# Patient Record
Sex: Female | Born: 1975
Health system: Southern US, Community
[De-identification: ages and names within clinical notes are randomized; demographics above are authoritative.]

## PROBLEM LIST (undated history)

## (undated) DIAGNOSIS — K829 Disease of gallbladder, unspecified: Secondary | ICD-10-CM

## (undated) DIAGNOSIS — Z800 Family history of malignant neoplasm of digestive organs: Secondary | ICD-10-CM

## (undated) DIAGNOSIS — E785 Hyperlipidemia, unspecified: Secondary | ICD-10-CM

## (undated) DIAGNOSIS — C4370 Malignant melanoma of unspecified lower limb, including hip: Secondary | ICD-10-CM

## (undated) DIAGNOSIS — F419 Anxiety disorder, unspecified: Secondary | ICD-10-CM

## (undated) DIAGNOSIS — T7840XA Allergy, unspecified, initial encounter: Secondary | ICD-10-CM

## (undated) DIAGNOSIS — G4730 Sleep apnea, unspecified: Secondary | ICD-10-CM

## (undated) DIAGNOSIS — IMO0002 Reserved for concepts with insufficient information to code with codable children: Secondary | ICD-10-CM

## (undated) DIAGNOSIS — Z87440 Personal history of urinary (tract) infections: Secondary | ICD-10-CM

## (undated) DIAGNOSIS — R87619 Unspecified abnormal cytological findings in specimens from cervix uteri: Secondary | ICD-10-CM

## (undated) DIAGNOSIS — K8020 Calculus of gallbladder without cholecystitis without obstruction: Secondary | ICD-10-CM

## (undated) DIAGNOSIS — D649 Anemia, unspecified: Secondary | ICD-10-CM

## (undated) DIAGNOSIS — K219 Gastro-esophageal reflux disease without esophagitis: Secondary | ICD-10-CM

## (undated) DIAGNOSIS — C44311 Basal cell carcinoma of skin of nose: Secondary | ICD-10-CM

## (undated) HISTORY — DX: Malignant melanoma of unspecified lower limb, including hip: C43.70

## (undated) HISTORY — DX: Basal cell carcinoma of skin of nose: C44.311

## (undated) HISTORY — PX: COLONOSCOPY: SHX174

## (undated) HISTORY — DX: Allergy, unspecified, initial encounter: T78.40XA

## (undated) HISTORY — DX: Hyperlipidemia, unspecified: E78.5

## (undated) HISTORY — DX: Anemia, unspecified: D64.9

## (undated) HISTORY — DX: Family history of malignant neoplasm of digestive organs: Z80.0

## (undated) HISTORY — DX: Anxiety disorder, unspecified: F41.9

## (undated) HISTORY — DX: Personal history of urinary (tract) infections: Z87.440

## (undated) HISTORY — DX: Gastro-esophageal reflux disease without esophagitis: K21.9

## (undated) HISTORY — DX: Sleep apnea, unspecified: G47.30

## (undated) HISTORY — PX: CHOLECYSTECTOMY: SHX55

## (undated) HISTORY — PX: CRYOTHERAPY: SHX1416

---

## 2002-03-12 ENCOUNTER — Other Ambulatory Visit: Admission: RE | Admit: 2002-03-12 | Discharge: 2002-03-12 | Payer: Self-pay | Admitting: *Deleted

## 2003-06-05 ENCOUNTER — Other Ambulatory Visit: Admission: RE | Admit: 2003-06-05 | Discharge: 2003-06-05 | Payer: Self-pay | Admitting: *Deleted

## 2004-08-29 ENCOUNTER — Ambulatory Visit: Payer: Self-pay | Admitting: Internal Medicine

## 2004-09-13 ENCOUNTER — Ambulatory Visit: Payer: Self-pay | Admitting: Internal Medicine

## 2004-09-20 ENCOUNTER — Ambulatory Visit: Payer: Self-pay | Admitting: Internal Medicine

## 2005-04-07 ENCOUNTER — Ambulatory Visit: Payer: Self-pay | Admitting: Internal Medicine

## 2006-03-01 ENCOUNTER — Ambulatory Visit: Payer: Self-pay | Admitting: Internal Medicine

## 2006-03-02 ENCOUNTER — Encounter: Admission: RE | Admit: 2006-03-02 | Discharge: 2006-03-02 | Payer: Self-pay | Admitting: Internal Medicine

## 2008-12-18 ENCOUNTER — Ambulatory Visit: Payer: Self-pay | Admitting: Family Medicine

## 2008-12-18 DIAGNOSIS — M533 Sacrococcygeal disorders, not elsewhere classified: Secondary | ICD-10-CM | POA: Insufficient documentation

## 2008-12-18 DIAGNOSIS — J45909 Unspecified asthma, uncomplicated: Secondary | ICD-10-CM | POA: Insufficient documentation

## 2009-01-04 ENCOUNTER — Ambulatory Visit: Payer: Self-pay | Admitting: Family Medicine

## 2009-01-04 DIAGNOSIS — F4320 Adjustment disorder, unspecified: Secondary | ICD-10-CM | POA: Insufficient documentation

## 2009-01-20 ENCOUNTER — Telehealth (INDEPENDENT_AMBULATORY_CARE_PROVIDER_SITE_OTHER): Payer: Self-pay | Admitting: *Deleted

## 2009-01-21 ENCOUNTER — Ambulatory Visit: Payer: Self-pay | Admitting: Family Medicine

## 2009-01-21 DIAGNOSIS — M25539 Pain in unspecified wrist: Secondary | ICD-10-CM | POA: Insufficient documentation

## 2009-03-12 ENCOUNTER — Ambulatory Visit: Payer: Self-pay | Admitting: Family Medicine

## 2009-03-15 LAB — CONVERTED CEMR LAB: Rh Type: NEGATIVE

## 2009-03-16 ENCOUNTER — Encounter (INDEPENDENT_AMBULATORY_CARE_PROVIDER_SITE_OTHER): Payer: Self-pay | Admitting: *Deleted

## 2009-03-16 LAB — CONVERTED CEMR LAB
ALT: 13 units/L (ref 0–35)
AST: 17 units/L (ref 0–37)
Alkaline Phosphatase: 60 units/L (ref 39–117)
Basophils Relative: 0.1 % (ref 0.0–3.0)
Calcium: 8.9 mg/dL (ref 8.4–10.5)
Glucose, Bld: 90 mg/dL (ref 70–99)
HDL: 53 mg/dL (ref >39.00)
LDL Cholesterol: 130 mg/dL — ABNORMAL HIGH (ref 0–99)
Lymphocytes Relative: 40.8 % (ref 12.0–46.0)
MCHC: 34.4 g/dL (ref 30.0–36.0)
MCV: 90.5 fL (ref 78.0–100.0)
Neutro Abs: 3 10*3/uL (ref 1.4–7.7)
Neutrophils Relative %: 46.5 % (ref 43.0–77.0)
Platelets: 232 10*3/uL (ref 150.0–400.0)
RBC: 4.41 M/uL (ref 3.87–5.11)
RDW: 12.8 % (ref 11.5–14.6)
Sodium: 139 meq/L (ref 135–145)
TSH: 2.18 microintl units/mL (ref 0.35–5.50)
Total CHOL/HDL Ratio: 4

## 2009-06-03 ENCOUNTER — Telehealth (INDEPENDENT_AMBULATORY_CARE_PROVIDER_SITE_OTHER): Payer: Self-pay | Admitting: *Deleted

## 2009-06-07 ENCOUNTER — Telehealth (INDEPENDENT_AMBULATORY_CARE_PROVIDER_SITE_OTHER): Payer: Self-pay | Admitting: *Deleted

## 2009-06-07 ENCOUNTER — Ambulatory Visit: Payer: Self-pay | Admitting: Family Medicine

## 2009-09-08 ENCOUNTER — Ambulatory Visit: Payer: Self-pay | Admitting: Family

## 2009-11-15 ENCOUNTER — Ambulatory Visit: Payer: Self-pay | Admitting: Family

## 2009-11-15 DIAGNOSIS — M545 Low back pain, unspecified: Secondary | ICD-10-CM | POA: Insufficient documentation

## 2009-12-19 ENCOUNTER — Inpatient Hospital Stay (HOSPITAL_COMMUNITY): Admission: RE | Admit: 2009-12-19 | Discharge: 2009-12-19 | Payer: Self-pay | Admitting: Obstetrics and Gynecology

## 2009-12-31 ENCOUNTER — Ambulatory Visit (HOSPITAL_COMMUNITY): Admission: RE | Admit: 2009-12-31 | Discharge: 2009-12-31 | Payer: Self-pay | Admitting: Obstetrics and Gynecology

## 2010-02-20 ENCOUNTER — Observation Stay (HOSPITAL_COMMUNITY): Admission: AD | Admit: 2010-02-20 | Discharge: 2010-02-20 | Payer: Self-pay | Admitting: Obstetrics & Gynecology

## 2010-03-15 ENCOUNTER — Inpatient Hospital Stay (HOSPITAL_COMMUNITY): Admission: AD | Admit: 2010-03-15 | Discharge: 2010-03-18 | Payer: Self-pay | Admitting: Obstetrics & Gynecology

## 2010-03-16 ENCOUNTER — Encounter (INDEPENDENT_AMBULATORY_CARE_PROVIDER_SITE_OTHER): Payer: Self-pay | Admitting: Obstetrics & Gynecology

## 2010-04-01 ENCOUNTER — Ambulatory Visit (HOSPITAL_COMMUNITY): Admission: RE | Admit: 2010-04-01 | Discharge: 2010-04-01 | Payer: Self-pay | Admitting: Surgery

## 2010-09-23 ENCOUNTER — Ambulatory Visit: Payer: Self-pay | Admitting: Family Medicine

## 2010-09-23 DIAGNOSIS — M654 Radial styloid tenosynovitis [de Quervain]: Secondary | ICD-10-CM | POA: Insufficient documentation

## 2010-10-09 NOTE — L&D Delivery Note (Signed)
Delivery Note At approximately 0100 a viable female was delivered via NSVD at home.  Patient presented to L&D via EMS.  Placenta was delivered; spontaneous and intact.  3-vessel cord with no complications.  Fundus contracted down well.  IM pitocin was available, declined by patient.  Anesthesia:  none Episiotomy: n/a Lacerations: 2nd degree perineal Suture Repair: 3.0 vicryl Est. Blood Loss (mL):  Mom to postpartum.  Baby to nursery-stable.  [name], Alexandra Tolan 07/08/2011, 1:31 AM

## 2010-11-08 NOTE — Assessment & Plan Note (Signed)
Summary: tailbone issue/lower back//lh   Vital Signs:  Patient profile:   35 year old female Weight:      136.6 pounds Pulse rate:   64 / minute BP sitting:   110 / 60  (right arm)  Vitals Entered By: Doristine Devoid (November 15, 2009 8:13 AM) CC: tailbone flare up now going up to lower back    CC:  tailbone flare up now going up to lower back .  History of Present Illness: Alexandra [name] is a 35 year old pregnant female who presents today with complain of worsening low back pain.  She has a history of tailbone pain which has now progressed to low back pain.  Pain is best in AM, worst as day moves on.  Has been doing prenatal yoga and recently also bought a new mattress.  These measures have not helped.  She has used tylenol on occasion with some relief.  Allergies: 1)  ! Septra  Physical Exam  General:  Well-developed,well-nourished,in no acute distress; alert,appropriate and cooperative throughout examination Abdomen:  gravid Msk:  no tenderness overlaying spine Neurologic:  strength normal in bilateral lower extremities.  Gait normal.   alert & oriented X3.  patellar refexes intact bilaterally   Impression & Recommendations:  Problem # 1:  LOW BACK PAIN SYNDROME (ICD-724.2) Assessment Deteriorated I believe that this has been made worse by patient's pregnancy.  I recommended continued stretching/prenatal yoga.  as needed tylenol and that she try a pregnancy body pillow for sleep.  Unfortunately we are limited in what we can do for her while she is pregnant.  We may consider physical therapy if ok with her OB and symptoms do not improve with the above measures.  Complete Medication List: 1)  Butt Donut  .... Use as needed  Patient Instructions: 1)  Continue to use your donut pillow as needed for tail bone pain.   2)  Take 650mg  of Tylenol every 4-6 hours as needed for relief of severe back pain.   3)  You may continue your prenatal yoga class, but don't do any activities that  worsen your pain. 4)  Consider investing in a pregnancy body pillow (Snoogle) This may also help your pain.   5)  Please call if symptoms worsen or do not improve.

## 2010-11-10 NOTE — Assessment & Plan Note (Signed)
Summary: both hands numb and tingley;also affecting thumb --carpel tun...   Vital Signs:  Patient profile:   35 year old female Weight:      122 pounds BMI:     19.76 Pulse rate:   108 / minute BP sitting:   118 / 70  (left arm)  Vitals Entered By: Doristine Devoid CMA (September 23, 2010 1:15 PM) CC: R wrist painful worst w/ moving thumb    History of Present Illness: 35 yo woman here today for R wrist pain.  has been wearing wrist braces for the carpal tunnel sxs but a few weeks ago developed severe pain in R wrist and thumb.  on Wed could barely move hand.  Current Medications (verified): 1)  None  Allergies (verified): 1)  ! 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  reviewed supportive care and red flags that should prompt return.  Pt expresses understanding and is in agreement w/ this plan.  Complete Medication List: 1)  Ibuprofen 800 Mg Tabs (Ibuprofen) .Marland Kitchen.. 1 tab by mouth three times a day as needed for pain.  Patient Instructions: 1)  This is called DeQuervain's Tendonitis 2)  It should improve w/ anti-inflammatories, rest, and splinting 3)  Take the Ibuprofen regularly at first and then as needed.  Take w/ food 4)  You can ice the wrist for pain relief 5)  If no improvement in the next 2-3 weeks, call me 6)  Hang in there! 7)  Happy Holidays! Prescriptions: IBUPROFEN 800 MG TABS (IBUPROFEN) 1 tab by  mouth three times a day as needed for pain.  #60 x 1   Entered and Authorized by:   Neena Rhymes MD   Signed by:   Neena Rhymes MD on 09/23/2010   Method used:   Electronically to        [facility]. [phone]* (retail)       [address]       [place], [place]  [address]       Ph: [phone]       Fax: [phone]   RxID:   [id number]    Orders Added: 1)  Est. Patient Level III [66440]

## 2010-12-25 LAB — COMPREHENSIVE METABOLIC PANEL
ALT: 27 U/L (ref 0–35)
Albumin: 3.5 g/dL (ref 3.5–5.2)
BUN: 7 mg/dL (ref 6–23)
CO2: 27 mEq/L (ref 19–32)
Creatinine, Ser: 0.74 mg/dL (ref 0.4–1.2)
GFR calc Af Amer: >60 mL/min (ref >60)
Glucose, Bld: 84 mg/dL (ref 70–99)
Sodium: 142 mEq/L (ref 135–145)
Total Protein: 6.5 g/dL (ref 6.0–8.3)

## 2010-12-25 LAB — SURGICAL PCR SCREEN
MRSA, PCR: NEGATIVE
Staphylococcus aureus: NEGATIVE

## 2010-12-25 LAB — CBC
HCT: 40 % (ref 36.0–46.0)
Hemoglobin: 13.6 g/dL (ref 12.0–15.0)
Platelets: 284 10*3/uL (ref 150–400)
RBC: 4.34 MIL/uL (ref 3.87–5.11)
RDW: 14.2 % (ref 11.5–15.5)
WBC: 7.3 10*3/uL (ref 4.0–10.5)

## 2010-12-25 LAB — DIFFERENTIAL
Basophils Absolute: 0 10*3/uL (ref 0.0–0.1)
Eosinophils Absolute: 0.2 10*3/uL (ref 0.0–0.7)
Eosinophils Relative: 3 % (ref 0–5)
Lymphocytes Relative: 32 % (ref 12–46)
Lymphs Abs: 2.3 10*3/uL (ref 0.7–4.0)

## 2010-12-25 LAB — HCG, SERUM, QUALITATIVE: Preg, Serum: NEGATIVE

## 2010-12-26 LAB — CBC
HCT: 31.6 % — ABNORMAL LOW (ref 36.0–46.0)
HCT: 35.4 % — ABNORMAL LOW (ref 36.0–46.0)
HCT: 37.7 % (ref 36.0–46.0)
HCT: 36.6 % (ref 36.0–46.0)
Hemoglobin: 12.4 g/dL (ref 12.0–15.0)
Hemoglobin: 13 g/dL (ref 12.0–15.0)
Hemoglobin: 12.7 g/dL (ref 12.0–15.0)
MCHC: 34.5 g/dL (ref 30.0–36.0)
MCHC: 34.5 g/dL (ref 30.0–36.0)
MCHC: 34.9 g/dL (ref 30.0–36.0)
MCHC: 34.6 g/dL (ref 30.0–36.0)
MCV: 91.5 fL (ref 78.0–100.0)
MCV: 91.5 fL (ref 78.0–100.0)
MCV: 91.9 fL (ref 78.0–100.0)
Platelets: 196 10*3/uL (ref 150–400)
Platelets: 220 10*3/uL (ref 150–400)
RBC: 3.29 MIL/uL — ABNORMAL LOW (ref 3.87–5.11)
RBC: 3.87 MIL/uL (ref 3.87–5.11)
RBC: 3.98 MIL/uL (ref 3.87–5.11)
RDW: 14 % (ref 11.5–15.5)
RDW: 14.4 % (ref 11.5–15.5)
RDW: 14.8 % (ref 11.5–15.5)
RDW: 13.5 % (ref 11.5–15.5)
WBC: 12.4 10*3/uL — ABNORMAL HIGH (ref 4.0–10.5)
WBC: 19.3 10*3/uL — ABNORMAL HIGH (ref 4.0–10.5)
WBC: 17.2 10*3/uL — ABNORMAL HIGH (ref 4.0–10.5)

## 2010-12-26 LAB — COMPREHENSIVE METABOLIC PANEL
ALT: 59 U/L — ABNORMAL HIGH (ref 0–35)
ALT: 43 U/L — ABNORMAL HIGH (ref 0–35)
AST: 32 U/L (ref 0–37)
AST: 66 U/L — ABNORMAL HIGH (ref 0–37)
AST: 40 U/L — ABNORMAL HIGH (ref 0–37)
Albumin: 2.1 g/dL — ABNORMAL LOW (ref 3.5–5.2)
Albumin: 2.1 g/dL — ABNORMAL LOW (ref 3.5–5.2)
Albumin: 3 g/dL — ABNORMAL LOW (ref 3.5–5.2)
Albumin: 3.2 g/dL — ABNORMAL LOW (ref 3.5–5.2)
Alkaline Phosphatase: 128 U/L — ABNORMAL HIGH (ref 39–117)
Alkaline Phosphatase: 133 U/L — ABNORMAL HIGH (ref 39–117)
Alkaline Phosphatase: 208 U/L — ABNORMAL HIGH (ref 39–117)
Alkaline Phosphatase: 152 U/L — ABNORMAL HIGH (ref 39–117)
BUN: 3 mg/dL — ABNORMAL LOW (ref 6–23)
BUN: 4 mg/dL — ABNORMAL LOW (ref 6–23)
BUN: 4 mg/dL — ABNORMAL LOW (ref 6–23)
BUN: 4 mg/dL — ABNORMAL LOW (ref 6–23)
BUN: 7 mg/dL (ref 6–23)
CO2: 20 mEq/L (ref 19–32)
CO2: 22 mEq/L (ref 19–32)
CO2: 26 mEq/L (ref 19–32)
CO2: 22 mEq/L (ref 19–32)
Calcium: 7.9 mg/dL — ABNORMAL LOW (ref 8.4–10.5)
Calcium: 8.6 mg/dL (ref 8.4–10.5)
Calcium: 8.8 mg/dL (ref 8.4–10.5)
Chloride: 104 mEq/L (ref 96–112)
Chloride: 109 mEq/L (ref 96–112)
Chloride: 109 mEq/L (ref 96–112)
Chloride: 105 mEq/L (ref 96–112)
Creatinine, Ser: 0.63 mg/dL (ref 0.4–1.2)
Creatinine, Ser: 0.42 mg/dL (ref 0.4–1.2)
GFR calc Af Amer: >60 mL/min (ref >60)
GFR calc Af Amer: >60 mL/min (ref >60)
GFR calc non Af Amer: >60 mL/min (ref >60)
GFR calc non Af Amer: >60 mL/min (ref >60)
Glucose, Bld: 96 mg/dL (ref 70–99)
Glucose, Bld: 102 mg/dL — ABNORMAL HIGH (ref 70–99)
Potassium: 3.5 mEq/L (ref 3.5–5.1)
Potassium: 3.7 mEq/L (ref 3.5–5.1)
Potassium: 3.7 mEq/L (ref 3.5–5.1)
Potassium: 3.9 mEq/L (ref 3.5–5.1)
Sodium: 134 mEq/L — ABNORMAL LOW (ref 135–145)
Sodium: 138 mEq/L (ref 135–145)
Sodium: 139 mEq/L (ref 135–145)
Sodium: 135 mEq/L (ref 135–145)
Total Bilirubin: 0.1 mg/dL — ABNORMAL LOW (ref 0.3–1.2)
Total Bilirubin: 0.3 mg/dL (ref 0.3–1.2)
Total Bilirubin: 0.3 mg/dL (ref 0.3–1.2)
Total Bilirubin: 0.3 mg/dL (ref 0.3–1.2)
Total Protein: 4.8 g/dL — ABNORMAL LOW (ref 6.0–8.3)
Total Protein: 4.8 g/dL — ABNORMAL LOW (ref 6.0–8.3)
Total Protein: 6 g/dL (ref 6.0–8.3)
Total Protein: 6.2 g/dL (ref 6.0–8.3)
Total Protein: 6.4 g/dL (ref 6.0–8.3)

## 2010-12-26 LAB — RH IMMUNE GLOB WKUP(>/=20WKS)(NOT WOMEN'S HOSP): Fetal Screen: NEGATIVE

## 2010-12-26 LAB — DIFFERENTIAL
Basophils Absolute: 0 10*3/uL (ref 0.0–0.1)
Basophils Absolute: 0.1 10*3/uL (ref 0.0–0.1)
Basophils Relative: 0 % (ref 0–1)
Basophils Relative: 0 % (ref 0–1)
Eosinophils Absolute: 0.1 10*3/uL (ref 0.0–0.7)
Eosinophils Relative: 0 % (ref 0–5)
Eosinophils Relative: 1 % (ref 0–5)
Lymphocytes Relative: 19 % (ref 12–46)
Lymphs Abs: 0.7 10*3/uL (ref 0.7–4.0)
Monocytes Absolute: 1.5 10*3/uL — ABNORMAL HIGH (ref 0.1–1.0)
Monocytes Relative: 5 % (ref 3–12)
Monocytes Relative: 6 % (ref 3–12)
Neutro Abs: 13.9 10*3/uL — ABNORMAL HIGH (ref 1.7–7.7)
Neutro Abs: 20 10*3/uL — ABNORMAL HIGH (ref 1.7–7.7)

## 2010-12-26 LAB — AMYLASE
Amylase: 55 U/L (ref 0–105)
Amylase: 71 U/L (ref 0–105)

## 2010-12-26 LAB — SAMPLE TO BLOOD BANK

## 2011-01-02 LAB — URINALYSIS, ROUTINE W REFLEX MICROSCOPIC
Bilirubin Urine: NEGATIVE
Glucose, UA: NEGATIVE mg/dL
Hgb urine dipstick: NEGATIVE
Ketones, ur: NEGATIVE mg/dL
Nitrite: NEGATIVE
Protein, ur: NEGATIVE mg/dL
Specific Gravity, Urine: 1.02 (ref 1.005–1.030)
Urobilinogen, UA: 0.2 mg/dL (ref 0.0–1.0)
pH: 7 (ref 5.0–8.0)

## 2011-01-02 LAB — COMPREHENSIVE METABOLIC PANEL
ALT: 14 U/L (ref 0–35)
AST: 16 U/L (ref 0–37)
Albumin: 2.9 g/dL — ABNORMAL LOW (ref 3.5–5.2)
Alkaline Phosphatase: 64 U/L (ref 39–117)
BUN: 6 mg/dL (ref 6–23)
CO2: 25 mEq/L (ref 19–32)
Calcium: 8.8 mg/dL (ref 8.4–10.5)
Chloride: 103 mEq/L (ref 96–112)
Creatinine, Ser: 0.43 mg/dL (ref 0.4–1.2)
GFR calc Af Amer: >60 mL/min (ref >60)
GFR calc non Af Amer: >60 mL/min (ref >60)
Glucose, Bld: 81 mg/dL (ref 70–99)
Potassium: 4 mEq/L (ref 3.5–5.1)
Sodium: 134 mEq/L — ABNORMAL LOW (ref 135–145)
Total Bilirubin: 0.4 mg/dL (ref 0.3–1.2)
Total Protein: 6.1 g/dL (ref 6.0–8.3)

## 2011-01-02 LAB — LIPASE, BLOOD: Lipase: 37 U/L (ref 11–59)

## 2011-01-02 LAB — AMYLASE: Amylase: 66 U/L (ref 0–105)

## 2011-02-16 ENCOUNTER — Other Ambulatory Visit (HOSPITAL_COMMUNITY): Payer: Self-pay | Admitting: *Deleted

## 2011-02-16 DIAGNOSIS — Z3689 Encounter for other specified antenatal screening: Secondary | ICD-10-CM

## 2011-03-03 IMAGING — US US ABDOMEN COMPLETE
1 series · 13 of 25 positions shown · non-contrast
Comparison: None.

CLINICAL DATA: 21 weeks estimated gestational age with gallbladder
attack 2 weeks ago.  Right upper quadrant pain.

COMPLETE ABDOMINAL ULTRASOUND

[Series 1: us abdomen complete · 13 of 54 slices shown]
[im 1/54]
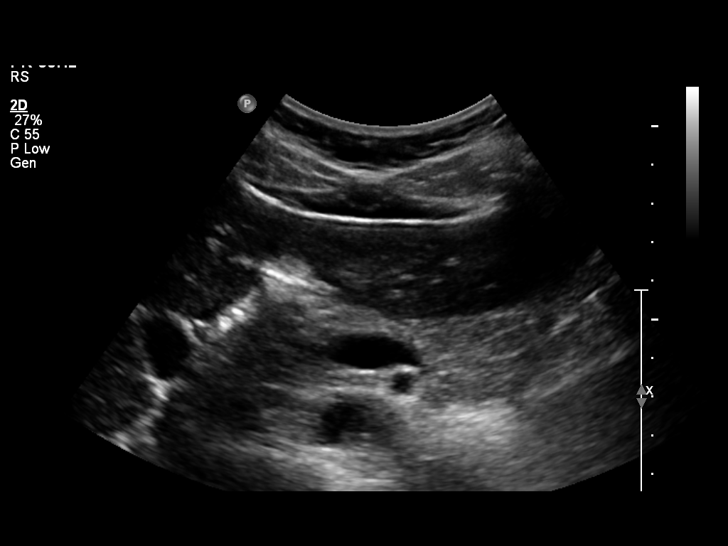
[im 5/54]
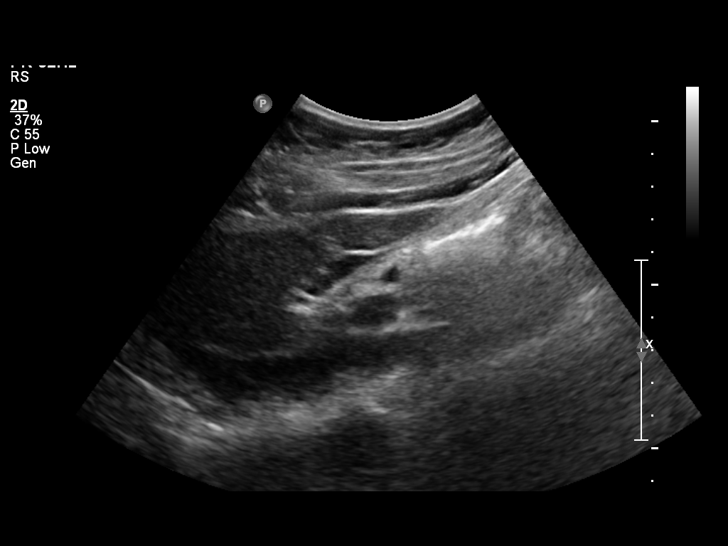
[im 9/54]
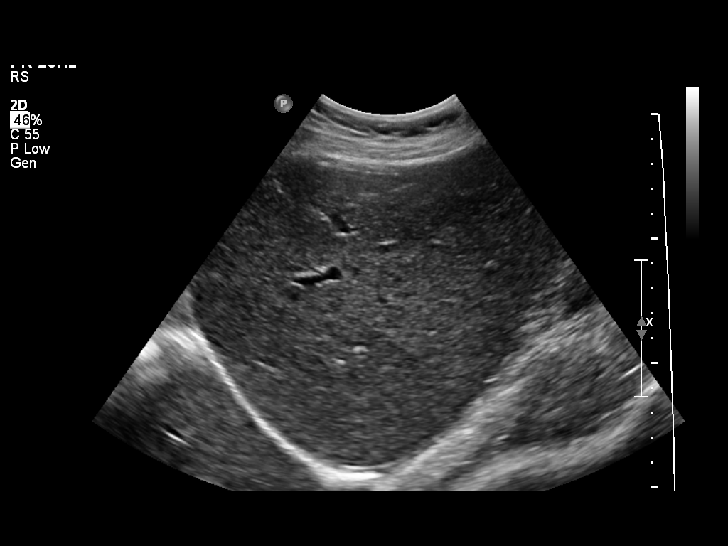
[im 14/54]
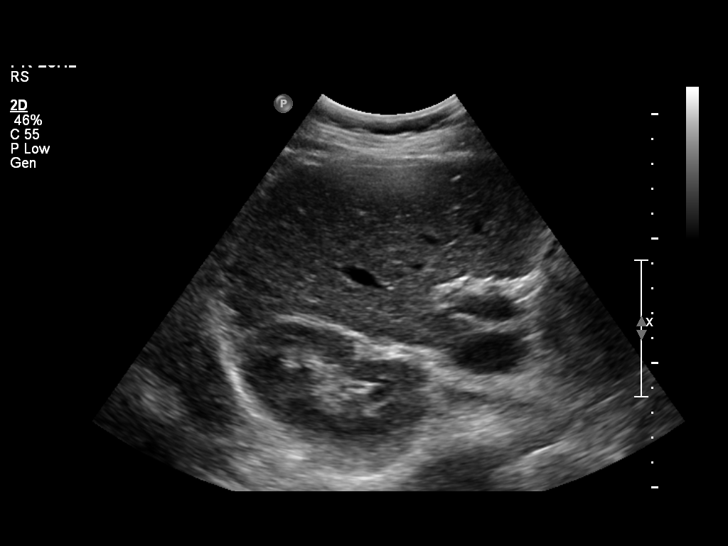
[im 18/54]
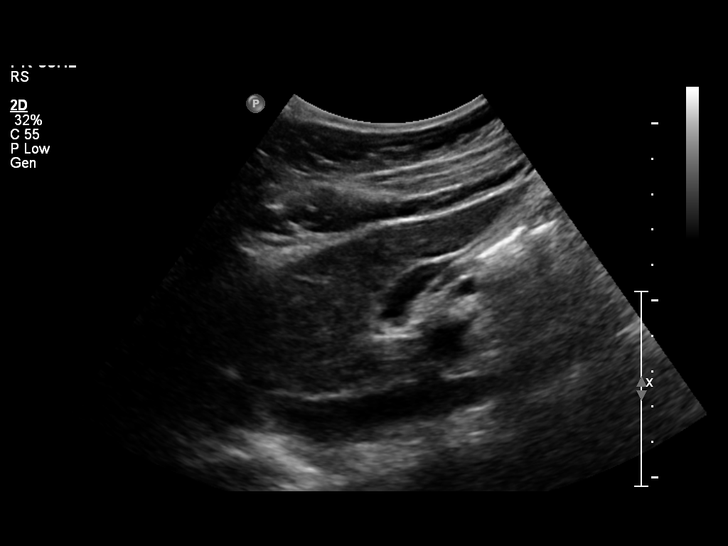
[im 23/54]
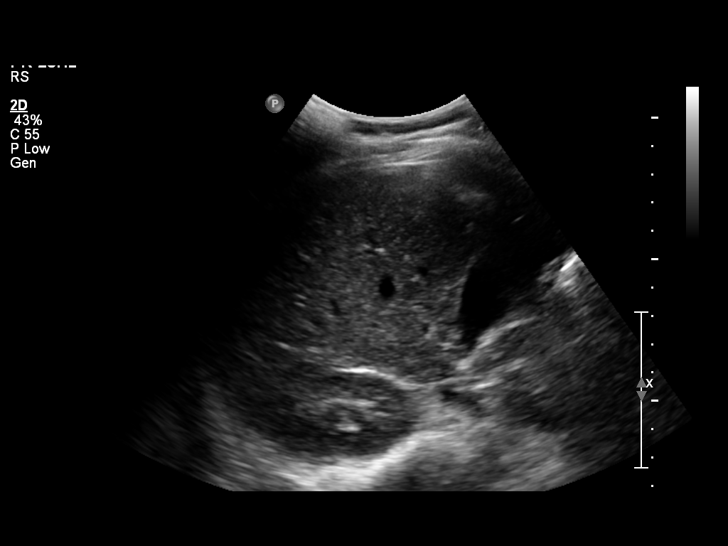
[im 27/54]
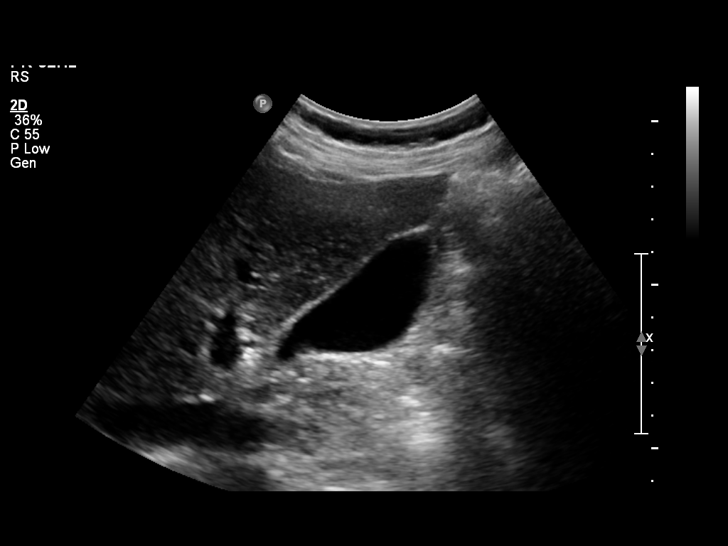
[im 31/54]
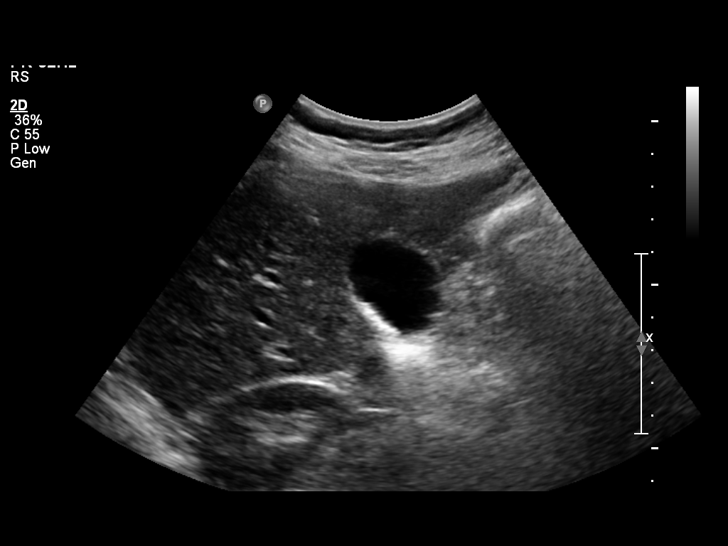
[im 36/54]
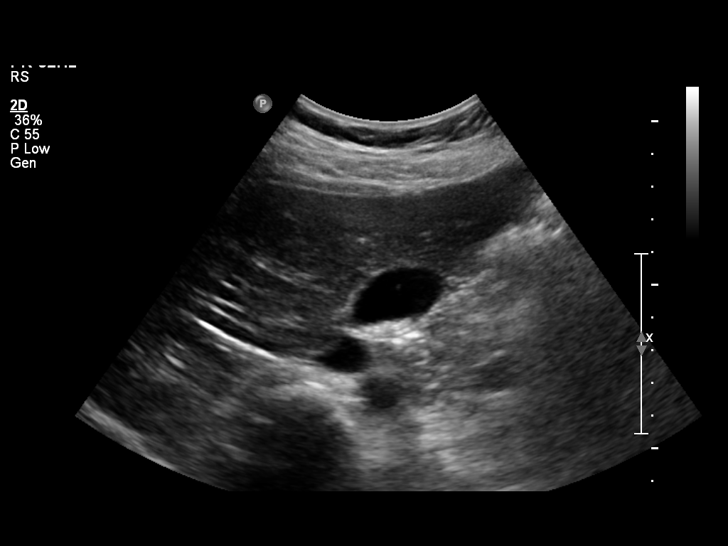
[im 40/54]
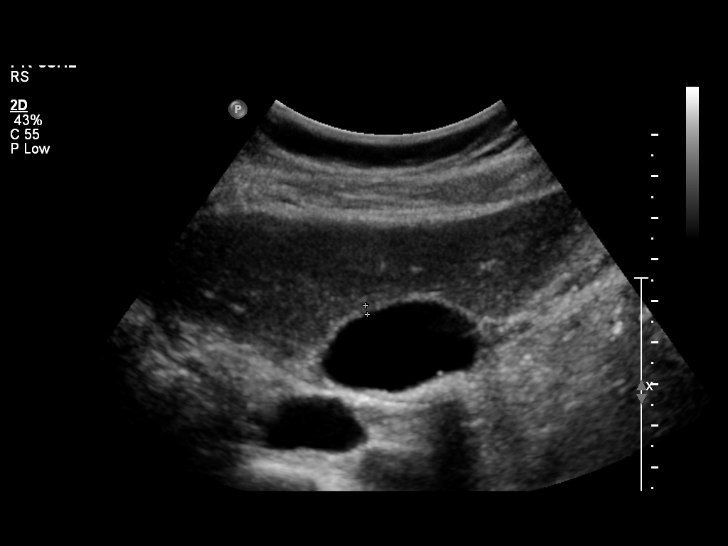
[im 45/54]
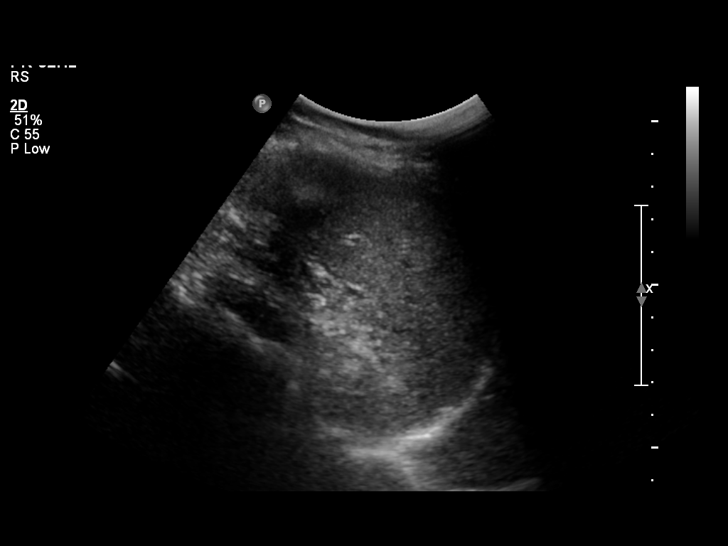
[im 49/54]
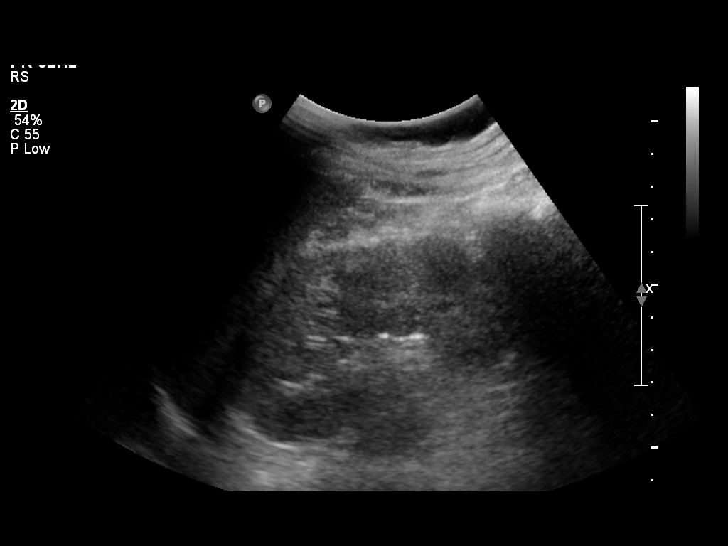
[im 54/54]
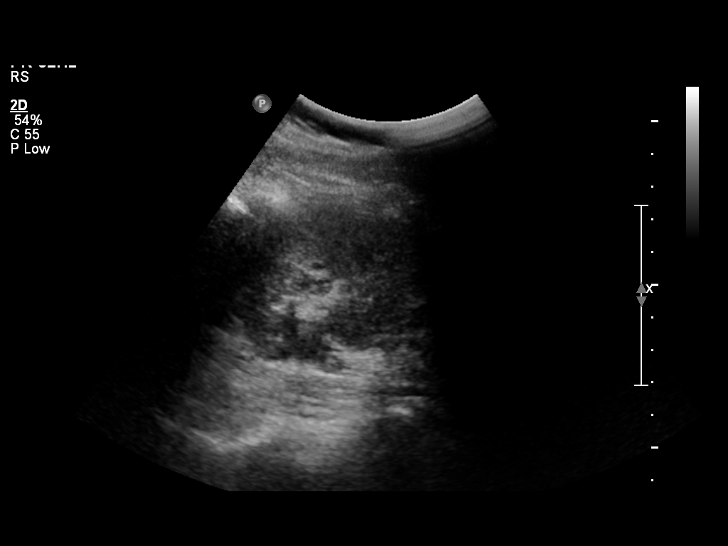

[13 of 25 positions shown; findings below may reference images not displayed]

FINDINGS: Gallbladder:  There are multiple mobile small layering gallstones
identified which are individually not discriminated but likely
measure on the order of 2-3 mm in size.  No sonographic Murphy's
sign was noted.  No gallbladder wall thickening or pericholecystic
fluid was noted.

Common bile duct:  Demonstrates an AP width of 3.4 mm and has a
normal appearance

Liver:  No focal lesion identified.  Within normal limits in
parenchymal echogenicity.

IVC:  Appears normal.

Pancreas:  No focal abnormality seen.

Spleen:  Measures 9.7 cm in sagittal length and is homogeneous in
echotexture.

Right Kidney:  Demonstrates a sagittal length of 11.1 cm.  No focal
parenchymal abnormalities or hydronephrosis are noted.

Left Kidney:  Demonstrates a sagittal length of 11.1 cm.  No focal
parenchymal abnormalities or signs of hydronephrosis are noted.

Abdominal aorta:  No aneurysm identified.
IMPRESSION: Multiple very small layering gallstones noted as described above.
No associated signs suggestive of cholecystitis are seen.
Otherwise unremarkable abdominal ultrasound.

## 2011-03-14 ENCOUNTER — Ambulatory Visit (HOSPITAL_COMMUNITY)
Admission: RE | Admit: 2011-03-14 | Discharge: 2011-03-14 | Disposition: A | Discharge status: Home / Self Care | Payer: 59 | Source: Ambulatory Visit | Attending: Radiology | Admitting: Radiology

## 2011-03-14 DIAGNOSIS — Z1389 Encounter for screening for other disorder: Secondary | ICD-10-CM | POA: Insufficient documentation

## 2011-03-14 DIAGNOSIS — Z363 Encounter for antenatal screening for malformations: Secondary | ICD-10-CM | POA: Insufficient documentation

## 2011-03-14 DIAGNOSIS — O09529 Supervision of elderly multigravida, unspecified trimester: Secondary | ICD-10-CM | POA: Insufficient documentation

## 2011-03-14 DIAGNOSIS — Z3689 Encounter for other specified antenatal screening: Secondary | ICD-10-CM

## 2011-03-14 DIAGNOSIS — O358XX0 Maternal care for other (suspected) fetal abnormality and damage, not applicable or unspecified: Secondary | ICD-10-CM | POA: Insufficient documentation

## 2011-04-21 ENCOUNTER — Encounter (HOSPITAL_COMMUNITY): Payer: Self-pay | Admitting: *Deleted

## 2011-04-21 ENCOUNTER — Inpatient Hospital Stay (HOSPITAL_COMMUNITY): Payer: 59

## 2011-04-21 ENCOUNTER — Inpatient Hospital Stay (HOSPITAL_COMMUNITY)
Admission: AD | Admit: 2011-04-21 | Discharge: 2011-04-21 | Disposition: A | Discharge status: Home / Self Care | Payer: 59 | Source: Ambulatory Visit | Attending: Obstetrics and Gynecology | Admitting: Obstetrics and Gynecology

## 2011-04-21 DIAGNOSIS — O4700 False labor before 37 completed weeks of gestation, unspecified trimester: Secondary | ICD-10-CM

## 2011-04-21 HISTORY — DX: Unspecified abnormal cytological findings in specimens from cervix uteri: R87.619

## 2011-04-21 HISTORY — DX: Disease of gallbladder, unspecified: K82.9

## 2011-04-21 HISTORY — DX: Calculus of gallbladder without cholecystitis without obstruction: K80.20

## 2011-04-21 HISTORY — DX: Reserved for concepts with insufficient information to code with codable children: IMO0002

## 2011-04-21 LAB — URINALYSIS, ROUTINE W REFLEX MICROSCOPIC
Bilirubin Urine: NEGATIVE
Hgb urine dipstick: NEGATIVE
Specific Gravity, Urine: 1.01 (ref 1.005–1.030)
pH: 7 (ref 5.0–8.0)

## 2011-04-21 LAB — WET PREP, GENITAL
Clue Cells Wet Prep HPF POC: NONE SEEN
Trich, Wet Prep: NONE SEEN
Yeast Wet Prep HPF POC: NONE SEEN

## 2011-04-21 MED ORDER — TERBUTALINE SULFATE 1 MG/ML IJ SOLN
0.2500 mg | Freq: Once | INTRAMUSCULAR | Status: AC
  Administered 2011-04-21: 0.25 mg via SUBCUTANEOUS
  Filled 2011-04-21: qty 1

## 2011-04-21 MED ORDER — NIFEDIPINE 30 MG (OSM) PO TB24: 30.0000 mg | ORAL_TABLET | Freq: Every day | ORAL | Status: DC

## 2011-04-21 NOTE — Progress Notes (Signed)
Pt states, " I started having contractions at 1730 and I feel them in my low abd and low back. My last baby was born at 10 wks."

## 2011-04-21 NOTE — ED Provider Notes (Addendum)
History     Chief Complaint  Patient presents with  . Laboring   HPI Pt presents to MAU with c/o contractions for several hours. States they began as mild cramps and they have built throughout the day. She has been drinking a lot of water to try and slow them down but this has not had any effect. She admits to having intercourse earlier today, but the contractions started over 4 hours later. She has a hx of 1 preterm delivery at 33 weeks but is not on 17-P or any medications to assist with preventing preterm birth. She wishes to deliver with a midwife practice at a birth center in [place], and does not want any IV, injections, or interventions that are "not natural."  OB History    Grav Para Term Preterm Abortions TAB SAB Ect Mult Living   2 1  1      1       Past Medical History  Diagnosis Date  . Gallbladder attack   . Gallstone   . Abnormal Pap smear     Past Surgical History  Procedure Date  . Cholecystectomy   . Cryotherapy     Family History  Problem Relation Age of Onset  . Cancer Mother   . Hypertension Father   . Cancer Maternal Grandmother   . Heart disease Maternal Grandfather     History  Substance Use Topics  . Smoking status: Not on file  . Smokeless tobacco: Not on file  . Alcohol Use: No    Allergies:  Allergies  Allergen Reactions  . Sulfamethoxazole W/Trimethoprim     REACTION: hives    Prescriptions prior to admission  Medication Sig Dispense Refill  . PE-Shark Liver Oil-Cocoa Buttr (HEMORRHOID RE) Place 1 application rectally daily as needed.        . prenatal vitamin w/FE, FA (PRENATAL 1 + 1) 27-1 MG TABS Take 1 tablet by mouth daily.        . ranitidine (ZANTAC) 75 MG tablet Take 75 mg by mouth daily as needed.          Review of Systems  Constitutional: Negative for fever and chills.  Eyes: Negative for blurred vision and double vision.  Respiratory: Negative for cough.   Cardiovascular: Negative for chest pain.    Gastrointestinal: Positive for abdominal pain. Negative for heartburn and diarrhea.  Genitourinary: Positive for frequency. Negative for dysuria and urgency.  Musculoskeletal: Negative for myalgias.  Skin: Negative for rash.  Neurological: Negative for dizziness, tingling and headaches.  Psychiatric/Behavioral: Negative for depression.   Physical Exam   Blood pressure 99/60, pulse 84, temperature 98.6 F (37 C), temperature source Oral, resp. rate 20, height 5' 4.5" (1.638 m), weight 140 lb 6 oz (63.674 kg), SpO2 97.00%.  Physical Exam  Constitutional: She is oriented to person, place, and time. She appears well-developed and well-nourished. No distress.  HENT:  Head: Normocephalic.  Eyes: Pupils are equal, round, and reactive to light.  Neck: Normal range of motion.  Cardiovascular: Normal rate, regular rhythm and normal heart sounds.   No murmur heard. Respiratory: Effort normal and breath sounds normal. No respiratory distress.  GI: Soft. There is no tenderness. There is no rebound and no guarding.  Genitourinary: Vagina normal. Cervix exhibits no motion tenderness, no discharge and no friability. No vaginal discharge found.  Musculoskeletal: Normal range of motion.  Neurological: She is alert and oriented to person, place, and time. No cranial nerve deficit. Coordination normal.  Skin: Skin is  warm and dry. She is not diaphoretic. No erythema.  Dilation: 1 Effacement (%): Thick Cervical Position: Anterior Station: Ballotable Presentation: Undeterminable Exam by:: Agness Sibrian  Microscopic wet-mount exam shows negative for pathogens, normal epithelial cells.  MAU Course  Procedures Assessment/Plan 1. Preterm Labor: Will give IM terbutaline. Wet Prep and GC/Ch sent. Wet prep neg for infection and only showed a few white cells. UA negative for infection. [place] to eval cervical length. Pt had intercourse <24 hrs ago and cannot do FFN b/c could be falsely positive. Will cont to  monitor and reassess after terbutaline. If contractions cease, will d/c home on procardia and have pt f/u with midwifery practice she has been receiving her prenatal care with.   __________ Pt reassessed after Terbutaline. Contractions have slowed to every 6 minutes or more and are classified as not as strong as before. US shows cervical length of 5.1 cm. Pt agreeable to going home on procardia and is to make an appt with her midwifery practice for close follow up. Pt considering going back to [facility] for her [facility] care.

## 2011-04-21 NOTE — Progress Notes (Signed)
Pt is here with c/o abdominal cramping that got worse today. Denies any vaginal bleeding or discharge. Reports good fetal movement. Gets prenatal in [place].

## 2011-04-22 LAB — GC/CHLAMYDIA PROBE AMP, GENITAL
Chlamydia, DNA Probe: NEGATIVE
GC Probe Amp, Genital: NEGATIVE

## 2011-06-02 IMAGING — RF DG CHOLANGIOGRAM OPERATIVE
1 series · 5 of 5 positions shown · non-contrast
Comparison: none

CLINICAL DATA: Chronic calculus cholecystitis

Exam:  Diagnostic cholangiogram operative

[Series 1: run · 2 acquisitions, 5 frames shown]
[im 1/2]
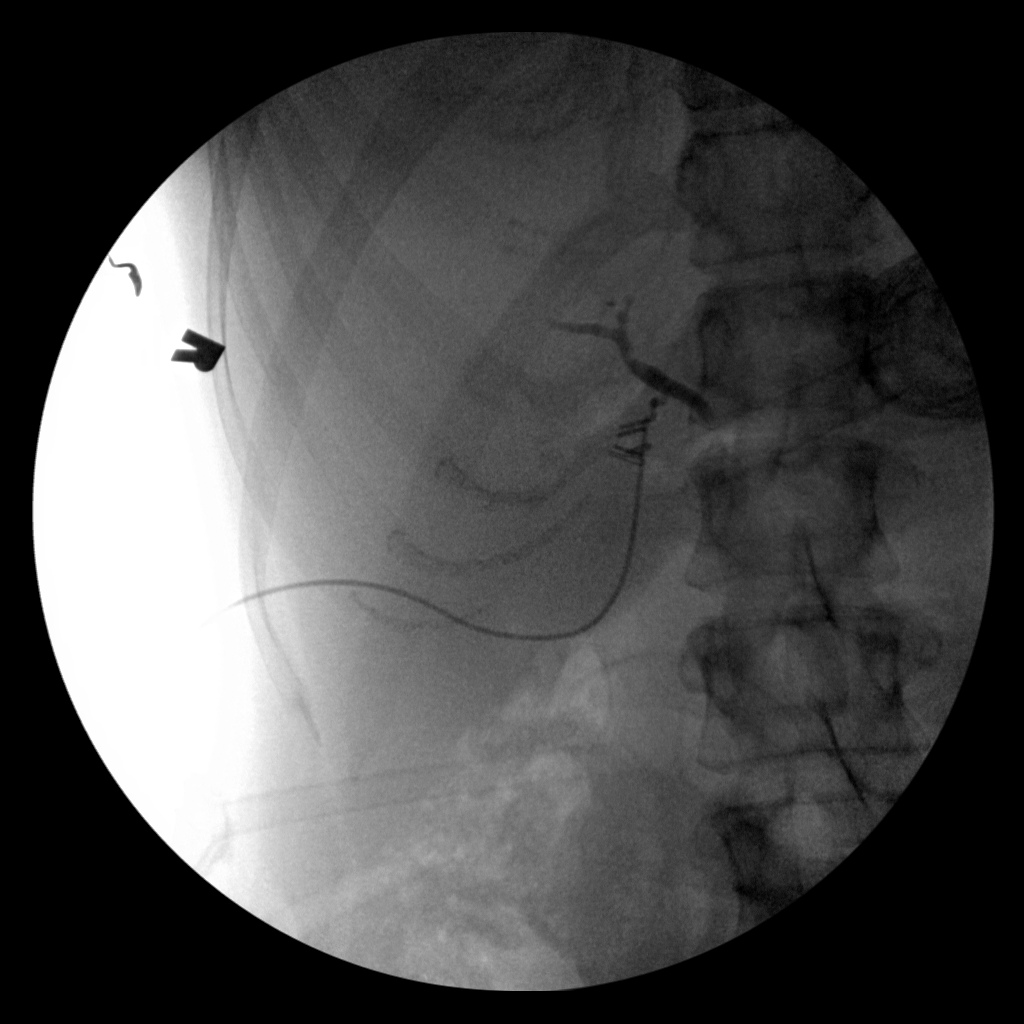
[im 1/2]
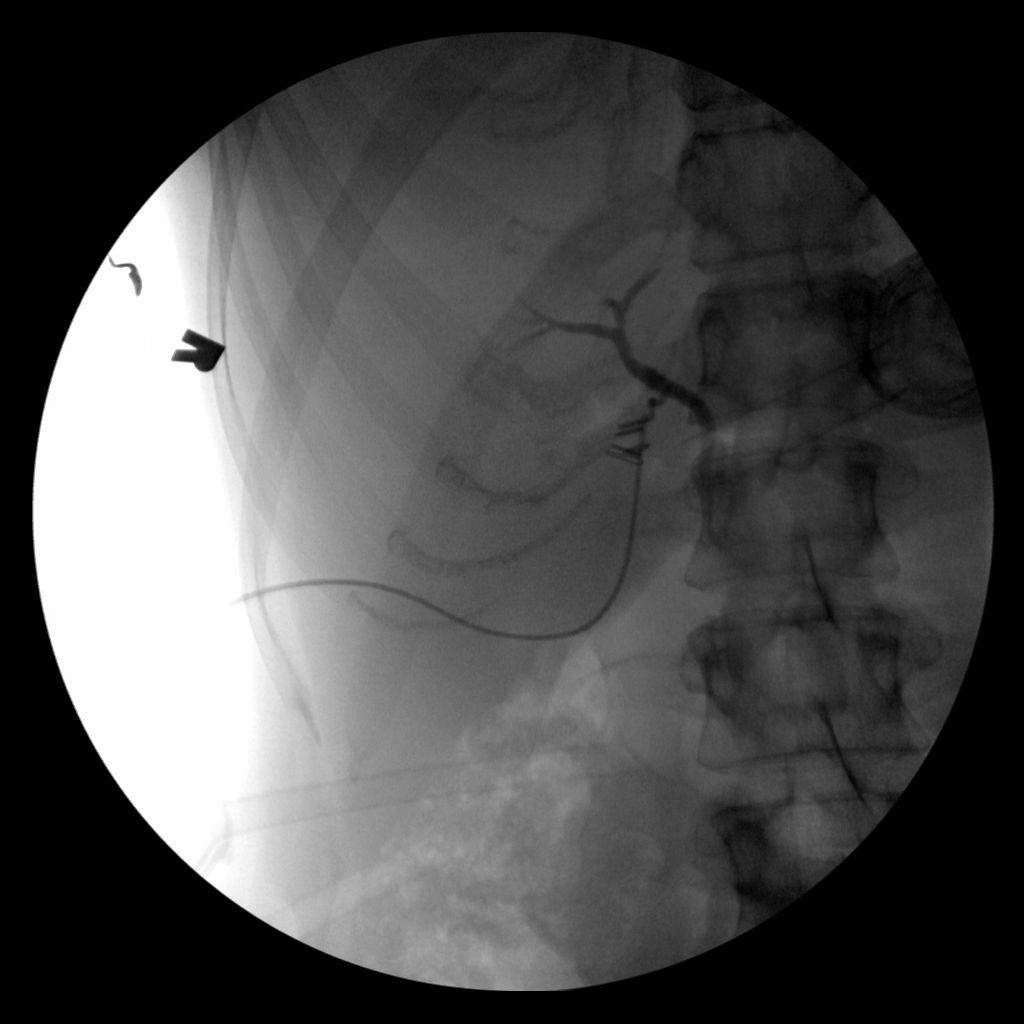
[im 1/2]
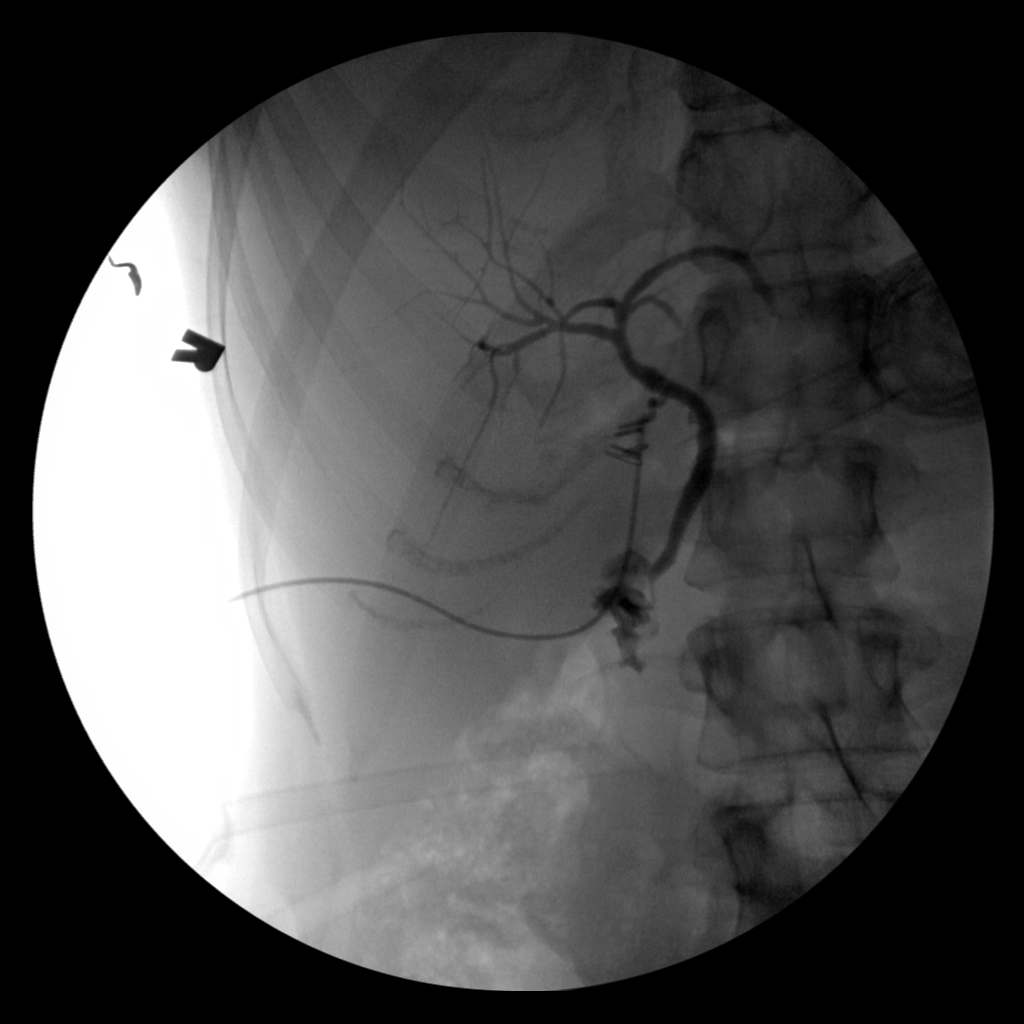
[im 1/2]
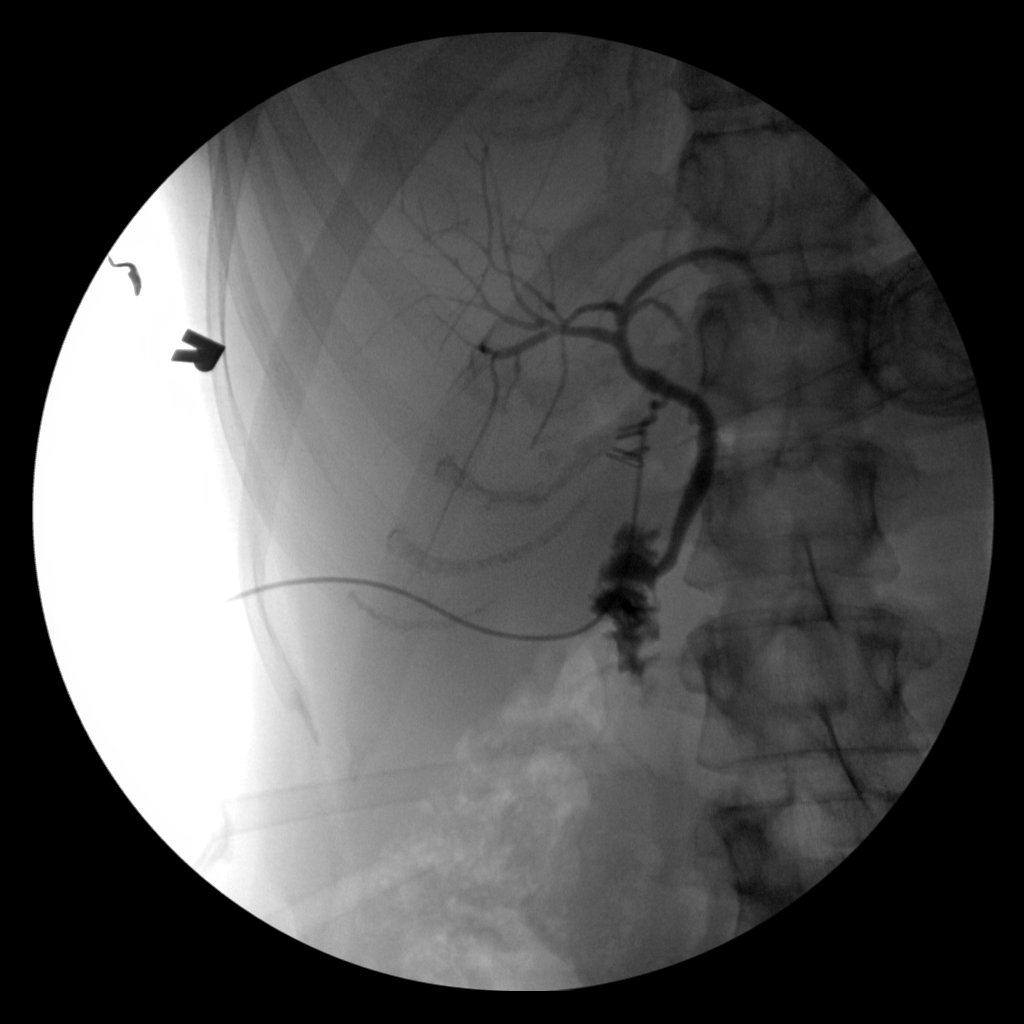
[im 2/2]
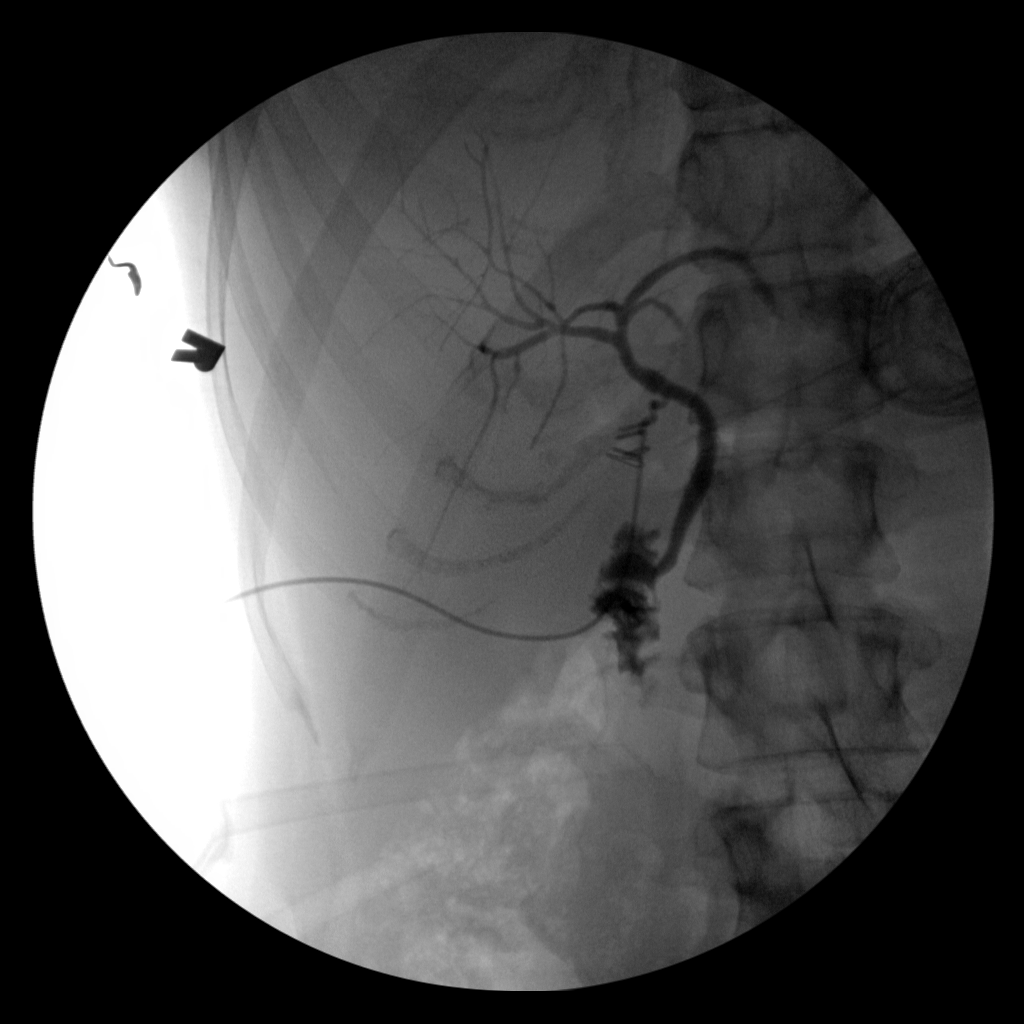

[5 of 5 positions shown; findings below may reference images not displayed]

FINDINGS: Contrast injection of the cystic duct led to the
excellent degree of contrast opacification of the biliary ducts.
No ductal stricture, dilatation, or filling defects.
IMPRESSION: Negative operative cholangiogram.

## 2011-06-09 ENCOUNTER — Encounter (HOSPITAL_COMMUNITY): Payer: Self-pay | Admitting: *Deleted

## 2011-06-09 ENCOUNTER — Inpatient Hospital Stay (HOSPITAL_COMMUNITY)
Admission: AD | Admit: 2011-06-09 | Discharge: 2011-06-11 | DRG: 778 | Disposition: A | Discharge status: Home / Self Care | Payer: 59 | Source: Ambulatory Visit | Attending: Obstetrics and Gynecology | Admitting: Obstetrics and Gynecology

## 2011-06-09 DIAGNOSIS — B373 Candidiasis of vulva and vagina: Secondary | ICD-10-CM | POA: Diagnosis present

## 2011-06-09 DIAGNOSIS — O2390 Unspecified genitourinary tract infection in pregnancy, unspecified trimester: Secondary | ICD-10-CM | POA: Diagnosis present

## 2011-06-09 DIAGNOSIS — A499 Bacterial infection, unspecified: Secondary | ICD-10-CM | POA: Diagnosis present

## 2011-06-09 DIAGNOSIS — B9689 Other specified bacterial agents as the cause of diseases classified elsewhere: Secondary | ICD-10-CM | POA: Diagnosis present

## 2011-06-09 DIAGNOSIS — O09529 Supervision of elderly multigravida, unspecified trimester: Secondary | ICD-10-CM | POA: Diagnosis present

## 2011-06-09 DIAGNOSIS — O4700 False labor before 37 completed weeks of gestation, unspecified trimester: Principal | ICD-10-CM | POA: Diagnosis present

## 2011-06-09 DIAGNOSIS — B3731 Acute candidiasis of vulva and vagina: Secondary | ICD-10-CM | POA: Diagnosis present

## 2011-06-09 DIAGNOSIS — N760 Acute vaginitis: Secondary | ICD-10-CM | POA: Diagnosis present

## 2011-06-09 LAB — URINE MICROSCOPIC-ADD ON

## 2011-06-09 LAB — URINALYSIS, ROUTINE W REFLEX MICROSCOPIC
Bilirubin Urine: NEGATIVE
Ketones, ur: NEGATIVE mg/dL
Nitrite: NEGATIVE
Protein, ur: NEGATIVE mg/dL
Urobilinogen, UA: 0.2 mg/dL (ref 0.0–1.0)

## 2011-06-09 LAB — WET PREP, GENITAL: Trich, Wet Prep: NONE SEEN

## 2011-06-09 MED ORDER — NIFEDIPINE ER OSMOTIC RELEASE 30 MG PO TB24
30.0000 mg | ORAL_TABLET | Freq: Every day | ORAL | Status: DC
Start: 2011-06-10 — End: 2011-06-11
  Administered 2011-06-10 – 2011-06-11 (×2): 30 mg via ORAL
  Filled 2011-06-09 (×2): qty 1

## 2011-06-09 MED ORDER — NIFEDIPINE ER OSMOTIC RELEASE 30 MG PO TB24
30.0000 mg | ORAL_TABLET | ORAL | Status: AC
  Administered 2011-06-10: 30 mg via ORAL
  Filled 2011-06-09: qty 1

## 2011-06-09 MED ORDER — BETAMETHASONE SOD PHOS & ACET 6 (3-3) MG/ML IJ SUSP
12.0000 mg | INTRAMUSCULAR | Status: AC
  Administered 2011-06-09 – 2011-06-11 (×2): 12 mg via INTRAMUSCULAR
  Filled 2011-06-09 (×3): qty 2

## 2011-06-09 MED ORDER — SODIUM CHLORIDE 0.9 % IV SOLN: INTRAVENOUS | Status: DC

## 2011-06-09 MED ORDER — PRENATAL PLUS 27-1 MG PO TABS: 1.0000 | ORAL_TABLET | Freq: Every day | ORAL | Status: DC

## 2011-06-09 MED ORDER — CALCIUM CARBONATE ANTACID 500 MG PO CHEW: 2.0000 | CHEWABLE_TABLET | ORAL | Status: DC | PRN

## 2011-06-09 MED ORDER — DOCUSATE SODIUM 100 MG PO CAPS: 100.0000 mg | ORAL_CAPSULE | Freq: Every day | ORAL | Status: DC

## 2011-06-09 MED ORDER — PROGESTERONE 8 % VA GEL
1.0000 "application " | Freq: Every day | VAGINAL | Status: DC
  Administered 2011-06-10: 10:00:00 via VAGINAL
  Administered 2011-06-11: 1 via VAGINAL
  Filled 2011-06-09 (×2): qty 1

## 2011-06-09 MED ORDER — ZOLPIDEM TARTRATE 10 MG PO TABS: 10.0000 mg | ORAL_TABLET | Freq: Every evening | ORAL | Status: DC | PRN

## 2011-06-09 MED ORDER — FAMOTIDINE 20 MG PO TABS: 20.0000 mg | ORAL_TABLET | Freq: Every day | ORAL | Status: DC

## 2011-06-09 MED ORDER — ACETAMINOPHEN 325 MG PO TABS
650.0000 mg | ORAL_TABLET | ORAL | Status: DC | PRN
  Administered 2011-06-10 (×2): 650 mg via ORAL
  Filled 2011-06-09 (×3): qty 2

## 2011-06-09 NOTE — Progress Notes (Signed)
G2P1 at 32.3wks. Ctxs since Thurs. Pt did bedrest and drank lots of water. This am ctxs started at 1000. Some intermittent cramping and ctxs. At 1730 had 8ctx an hr and 6 the next hr. Goes to [facility] in [place], [place]. Was seen here in July with ctxs. First baby delivered at 33wks. Denies bleeding or leaking fd.

## 2011-06-09 NOTE — ED Notes (Signed)
Pt brought straight back to Rm #6 for Triage

## 2011-06-09 NOTE — Progress Notes (Signed)
Up to BR.

## 2011-06-09 NOTE — ED Notes (Signed)
In to tell pt was going to start IV and pt states "I really don't want an IV. I promise to drink lots of flds and thought all my meds were goin to be oral". Dr Natale Milch at desk and aware of pt not wanting IV and states is ok.

## 2011-06-09 NOTE — Progress Notes (Signed)
Dr Elesa Massed in. Spec exam done and FFN, wet prep and GC/Chlam obtained. Pt tol well.

## 2011-06-09 NOTE — ED Notes (Signed)
Dr Elesa Massed notified of pt's admission and status. Will see pt;

## 2011-06-09 NOTE — ED Notes (Signed)
Dr Elesa Massed and Dr Natale Milch in to see pt

## 2011-06-09 NOTE — ED Notes (Signed)
Dr Elesa Massed in to see pt. Pt up to BR at 2145 and EFM now plugged back in.

## 2011-06-10 ENCOUNTER — Encounter (HOSPITAL_COMMUNITY): Payer: Self-pay | Admitting: *Deleted

## 2011-06-10 DIAGNOSIS — O4700 False labor before 37 completed weeks of gestation, unspecified trimester: Principal | ICD-10-CM

## 2011-06-10 LAB — GC/CHLAMYDIA PROBE AMP, GENITAL
Chlamydia, DNA Probe: NEGATIVE
GC Probe Amp, Genital: NEGATIVE

## 2011-06-10 MED ORDER — PRENATAL ONE DAILY 27-0.8 MG PO TABS
1.0000 | ORAL_TABLET | Freq: Every day | ORAL | Status: DC
  Filled 2011-06-10: qty 1

## 2011-06-10 MED ORDER — OXYCODONE-ACETAMINOPHEN 5-325 MG PO TABS
1.0000 | ORAL_TABLET | Freq: Four times a day (QID) | ORAL | Status: DC | PRN
  Administered 2011-06-10: 1 via ORAL
  Filled 2011-06-10: qty 1

## 2011-06-10 MED ORDER — PRENATAL PLUS 27-1 MG PO TABS
1.0000 | ORAL_TABLET | Freq: Every day | ORAL | Status: DC
  Administered 2011-06-10 – 2011-06-11 (×2): 1 via ORAL
  Filled 2011-06-10 (×2): qty 1

## 2011-06-10 MED ORDER — FLUCONAZOLE 150 MG PO TABS
150.0000 mg | ORAL_TABLET | Freq: Every day | ORAL | Status: AC
  Administered 2011-06-10: 150 mg via ORAL
  Filled 2011-06-10: qty 1

## 2011-06-10 MED ORDER — CALCIUM CITRATE PLUS/MAGNESIUM PO TABS
1.0000 | ORAL_TABLET | Freq: Three times a day (TID) | ORAL | Status: DC
  Administered 2011-06-10 (×3): via ORAL
  Administered 2011-06-11: 1 via ORAL
  Filled 2011-06-10 (×6): qty 1

## 2011-06-10 MED ORDER — METRONIDAZOLE 500 MG PO TABS
500.0000 mg | ORAL_TABLET | Freq: Two times a day (BID) | ORAL | Status: DC
  Administered 2011-06-10 – 2011-06-11 (×3): 500 mg via ORAL
  Filled 2011-06-10 (×3): qty 1

## 2011-06-10 MED ORDER — NON FORMULARY: 1.0000 | Freq: Three times a day (TID) | Status: DC

## 2011-06-10 NOTE — Progress Notes (Signed)
Monitors removed per patient request and MD order. Pt denies having any u/c's or discomfort at this time. Pt instructed to call if cramping or contractions resume.

## 2011-06-10 NOTE — Progress Notes (Signed)
Patient is stable, no UCs, no LOF, no VB. Good FM.  Reactive  NST.  Will do NST BID, tocometer as needed.  Second dose of BMZ to be given at 2330.  Will continue to monitor, discharge to home if stable tomorrow morning.  Plan discussed with patient; she agrees with plan.  [name],[name] 06/10/2011 12:36 PM

## 2011-06-10 NOTE — H&P (Signed)
ARACELI ARANGO is an 35 y.o. female.   Chief Complaint: pre-term labor HPI: 35yo G2/0101 @23 -2 who presents with 2 days of contractions. Not abated by hydration. Previous pregnancy complicated by "gallbladder issues" and delivery @33  wks. This pregnancy she presented at 26 wks with concerning contractions. Required procardia for tocolysis. Has declined 17-p this pregancy. Current care by midwife practice in [place]. Has been using Crinone gel daily.   Other than above episode this pregnancy has been uncomplicated. Has hx of Asthma and abn. Pap in 1999.   Denies recent fevers. No VB or LOF. No dysuria, vaginal discharge, vaginal pain, or rashes. No new sexual partners. No sick contacts.  Past Medical History  Diagnosis Date  . Gallbladder attack   . Gallstone   . Asthma   . Abnormal Pap smear     cryo 1999    Past Surgical History  Procedure Date  . Cholecystectomy   . Cryotherapy     Family History  Problem Relation Age of Onset  . Cancer Mother   . Hypertension Father   . Cancer Maternal Grandmother   . Heart disease Maternal Grandfather    Social History:  reports that she has never smoked. She has never used smokeless tobacco. She reports that she does not drink alcohol or use illicit drugs.  Allergies:  Allergies  Allergen Reactions  . Sulfamethoxazole W/Trimethoprim     REACTION: hives    Medications Prior to Admission  Medication Dose Route Frequency Provider Last Rate Last Dose  . 0.9 %  sodium chloride infusion   Intravenous Continuous Suzanna N Holbrook, DO      . acetaminophen (TYLENOL) tablet 650 mg  650 mg Oral Q4H PRN Chancy Milroy, DO      . betamethasone acetate-betamethasone sodium phosphate (CELESTONE) injection 12 mg  12 mg Intramuscular Q24H Suzanna N Holbrook, DO   12 mg at 06/09/11 2354  . calcium carbonate (TUMS - dosed in mg elemental calcium) chewable tablet 400 mg of elemental calcium  2 tablet Oral Q4H PRN Chancy Milroy, DO        . docusate sodium (COLACE) capsule 100 mg  100 mg Oral Daily Suzanna N Holbrook, DO      . famotidine (PEPCID) tablet 20 mg  20 mg Oral Daily Suzanna N Holbrook, DO      . NIFEdipine (PROCARDIA XL/ADALAT-CC) 24 hr tablet 30 mg  30 mg Oral Daily Suzanna N Holbrook, DO      . NIFEdipine (PROCARDIA XL/ADALAT-CC) 24 hr tablet 30 mg  30 mg Oral NOW Suzanna N Holbrook, DO   30 mg at 06/10/11 0002  . NON FORMULARY 1 tablet  1 tablet Oral TID WC & HS Suzanna N Holbrook, DO      . prenatal vitamin w/FE, FA (PRENATAL 1 + 1) 27-1 MG tablet 1 tablet  1 tablet Oral Daily Suzanna N Holbrook, DO      . PROGESTERONE (VAGINAL) 8 % GEL 1 application  1 application Vaginal Daily Suzanna N Holbrook, DO      . zolpidem (AMBIEN) tablet 10 mg  10 mg Oral QHS PRN Chancy Milroy, DO       Medications Prior to Admission  Medication Sig Dispense Refill  . PE-Shark Liver Oil-Cocoa Buttr (HEMORRHOID RE) Place 1 application rectally daily as needed.        . prenatal vitamin w/FE, FA (PRENATAL 1 + 1) 27-1 MG TABS Take 1 tablet by mouth daily.        Marland Kitchen  ranitidine (ZANTAC) 75 MG tablet Take 75 mg by mouth daily as needed.        Marland Kitchen NIFEdipine (PROCARDIA XL) 30 MG (OSM) 24 hr tablet Take 1 tablet (30 mg total) by mouth daily.  4 tablet  0    Results for orders placed during the hospital encounter of 06/09/11 (from the past 48 hour(s))  URINALYSIS, ROUTINE W REFLEX MICROSCOPIC     Status: Abnormal   Collection Time   06/09/11  8:50 PM      Component Value Range Comment   Color, Urine YELLOW  YELLOW     Appearance CLEAR  CLEAR     Specific Gravity, Urine <1.005 (*) 1.005 - 1.030     pH 5.5  5.0 - 8.0     Glucose, UA NEGATIVE  NEGATIVE (mg/dL)    Hgb urine dipstick NEGATIVE  NEGATIVE     Bilirubin Urine NEGATIVE  NEGATIVE     Ketones, ur NEGATIVE  NEGATIVE (mg/dL)    Protein, ur NEGATIVE  NEGATIVE (mg/dL)    Urobilinogen, UA 0.2  0.0 - 1.0 (mg/dL)    Nitrite NEGATIVE  NEGATIVE     Leukocytes, UA TRACE (*) NEGATIVE     URINE MICROSCOPIC-ADD ON     Status: Normal   Collection Time   06/09/11  8:50 PM      Component Value Range Comment   Squamous Epithelial / LPF RARE  RARE     WBC, UA 0-2  <3 (WBC/hpf)    RBC / HPF 0-2  <3 (RBC/hpf)    Bacteria, UA RARE  RARE    FETAL FIBRONECTIN     Status: Abnormal   Collection Time   06/09/11 10:20 PM      Component Value Range Comment   Fetal Fibronectin POSITIVE (*) NEGATIVE    WET PREP, GENITAL     Status: Abnormal   Collection Time   06/09/11 10:20 PM      Component Value Range Comment   Yeast, Wet Prep FEW (*) NONE SEEN     Trich, Wet Prep NONE SEEN  NONE SEEN     Clue Cells, Wet Prep FEW (*) NONE SEEN     WBC, Wet Prep HPF POC MANY (*) NONE SEEN  MANY BACTERIA SEEN    Review of Systems  Constitutional: Negative for fever, chills, weight loss and malaise/fatigue.  HENT: Negative for congestion and sore throat.   Eyes: Negative for blurred vision, double vision and photophobia.  Respiratory: Negative for cough, hemoptysis, sputum production, shortness of breath and wheezing.   Cardiovascular: Negative for chest pain, palpitations, orthopnea and claudication.  Gastrointestinal: Negative for heartburn, nausea and vomiting.       Irregular uterine cramping x2 days  Genitourinary: Negative for dysuria, urgency, frequency, hematuria and flank pain.  Musculoskeletal: Negative for myalgias.  Skin: Negative for itching and rash.  Neurological: Negative for dizziness, tingling, tremors, sensory change, focal weakness, loss of consciousness and headaches.  Endo/Heme/Allergies: Negative for polydipsia. Does not bruise/bleed easily.  Psychiatric/Behavioral: Negative for depression, suicidal ideas, hallucinations, memory loss and substance abuse. The patient is not nervous/anxious and does not have insomnia.     Blood pressure 101/68, pulse 67, temperature 97.9 F (36.6 C), temperature source Oral, resp. rate 20, height 5\' 5"  (1.651 m), weight 142 lb 9.6 oz (64.683  kg). Physical Exam  Constitutional: She is oriented to person, place, and time. She appears well-developed and well-nourished.  HENT:  Head: Normocephalic and atraumatic.  Eyes: Conjunctivae are normal.  Pupils are equal, round, and reactive to light.  Neck: Normal range of motion. Neck supple. No JVD present.  Cardiovascular: Normal rate, regular rhythm, normal heart sounds and intact distal pulses.  Exam reveals no gallop and no friction rub.   No murmur heard. Respiratory: Effort normal and breath sounds normal. No stridor. No respiratory distress. She has no wheezes. She has no rales. She exhibits no tenderness.  GI: Soft. Bowel sounds are normal. She exhibits no distension and no mass. There is no tenderness. There is no rebound and no guarding.       gravid  Genitourinary: Uterus normal. Vaginal discharge found.       Thick white/yellow mucus in vaginal vault  Musculoskeletal: She exhibits no edema and no tenderness.  Lymphadenopathy:    She has no cervical adenopathy.  Neurological: She is alert and oriented to person, place, and time. She has normal reflexes. No cranial nerve deficit.  Skin: Skin is warm and dry. No rash noted. No erythema. No pallor.  Psychiatric: She has a normal mood and affect. Her behavior is normal. Judgment and thought content normal.   Cervix: 2cm/70%/-2/soft  Assessment/Plan 1)Concern for pre-term labor - betamethasone IM, procardia PO, admit for further monitoring 2)BV - metronidazole 3)Yeast Infxn - Diflucan 4)Hx PTD - procardia for tocolysis, continue monitoring 5)Rh-: s/p rhogam, no VB at this time, will continue to monitor 6)Asthma - stable, continue to monitor 7)Abn Pap - last pap showed ASCUS, no high-risk HPV, f/u pp 8)-/I/?  Audrielle Vankuren, Beverely Pace A 06/10/2011, 2:15 AM

## 2011-06-10 NOTE — ED Notes (Signed)
0025 To antenatal 158 via w/c

## 2011-06-10 NOTE — ED Notes (Signed)
Report called to [facility] RN in Antenatal.

## 2011-06-11 MED ORDER — OXYCODONE-ACETAMINOPHEN 5-325 MG PO TABS
1.0000 | ORAL_TABLET | Freq: Four times a day (QID) | ORAL | Status: AC | PRN
Start: 2011-06-11 — End: 2011-06-21

## 2011-06-11 MED ORDER — NIFEDIPINE ER 30 MG PO TB24: 30.0000 mg | ORAL_TABLET | Freq: Every day | ORAL | Status: DC

## 2011-06-11 MED ORDER — METRONIDAZOLE 500 MG PO TABS: 500.0000 mg | ORAL_TABLET | Freq: Two times a day (BID) | ORAL | Status: DC

## 2011-06-11 NOTE — Discharge Summary (Signed)
  Antenatal Physician Discharge Summary  Patient ID: Alexandra [name] MRN: [id number] DOB/AGE: [birth year] 36 y.o.  Admit date: 06/09/2011 Discharge date: 06/11/2011  Admission Diagnoses: Preterm labor  Discharge Diagnoses: The same  Prenatal Procedures: NST  Consults: none  Significant Diagnostic Studies: labs: Wet prep showed BV, FFN was positive, normal CBC and UA.  Treatments: antibiotics: metronidazole  Hospital Course:  35 y.o. G2P0101 with IUP at [redacted]w[redacted]d admitted for preterm labor.  She demonstrated no further cervical change.  She received two doses of BMZ, and was continued on Procardia and Crinone gel.  She was stable at time of discharge.  Discharged Condition: stable  Discharge Exam: Blood pressure 94/49, pulse 61, temperature 97.9 F (36.6 C), temperature source Oral, resp. rate 16, height 5\' 5"  (1.651 m), weight 64.683 kg (142 lb 9.6 oz). General appearance: alert and no distress Resp: clear to auscultation bilaterally Cardio: regular rate and rhythm GI: soft, non-tender; bowel sounds normal; no masses,  no organomegaly Pelvic: Cervic was 1-2/50/-2  Disposition: Home or Self Care   Current Discharge Medication List    START taking these medications   Details  metroNIDAZOLE (FLAGYL) 500 MG tablet Take 1 tablet (500 mg total) by mouth every 12 (twelve) hours. Qty: 12 tablet, Refills: 0    oxyCODONE-acetaminophen (PERCOCET) 5-325 MG per tablet Take 1-2 tablets by mouth every 6 (six) hours as needed for pain (Also as needed for moderate/severe headache). Qty: 30 tablet, Refills: 0      CONTINUE these medications which have CHANGED   Details  !! NIFEdipine (PROCARDIA-XL/ADALAT CC) 30 MG 24 hr tablet Take 1 tablet (30 mg total) by mouth daily. Can increase to twice daily if contractions are more frequent Qty: 30 tablet, Refills: 2     !! - Potential duplicate medications found. Please discuss with Alexandra [name].    CONTINUE these medications which have NOT CHANGED    Details  Calcium-Magnesium (CAL-MAG) 500-250 MG TABS Take 1 tablet by mouth daily.      PE-Shark Liver Oil-Cocoa Buttr (HEMORRHOID RE) Place 1 application rectally daily as needed.      prenatal vitamin w/FE, FA (PRENATAL 1 + 1) 27-1 MG TABS Take 1 tablet by mouth daily.      PROGESTERONE, VAGINAL, (CRINONE) 8 % GEL Place 1 application vaginally daily.      ranitidine (ZANTAC) 75 MG tablet Take 75 mg by mouth daily as needed.      !! NIFEdipine (PROCARDIA XL) 30 MG (OSM) 24 hr tablet Take 1 tablet (30 mg total) by mouth daily. Qty: 4 tablet, Refills: 0     !! - Potential duplicate medications found. Please discuss with Alexandra [name].     Follow-up Information    Follow up with [facility] Midwife. (As scheduled)          Signed: [name],UGONNA [name] 06/11/2011, 7:42 AM

## 2011-06-11 NOTE — Progress Notes (Signed)
FACULTY PRACTICE ANTEPARTUM(COMPREHENSIVE) NOTE  Alexandra [name] is [name] 35 y.o. G2P0101 at [redacted]w[redacted]d  who is admitted for Preterm labor.   Fetal presentation is cephalic. Length of Stay:  2  Days  Subjective: No complaints. Patient reports good fetal movement.  She reports mild uterine contractions, no bleeding and no loss of fluid per vagina.  Vitals:  Blood pressure 94/49, pulse 61, temperature 97.9 F (36.6 C), temperature source Oral, resp. rate 16, height 5\' 5"  (1.651 m), weight 64.683 kg (142 lb 9.6 oz). Physical Examination:  General appearance - alert, well appearing, and in no distress Abdomen - soft, nontender, nondistended, no masses or organomegaly Fundal Height:  size equals dates Pelvic Exam:  normal external genitalia, vulva, vagina, cervix, uterus and adnexa Cervical Exam: Evaluated by digital exam. and found to be 1-2 cm/ 50%/-2 and fetal presentation is cephalic. Extremities: extremities normal, atraumatic, no cyanosis or edema and Homans sign is negative, no sign of DVT with DTRs 2+ bilaterally Membranes:intact  Fetal Monitoring:  Baseline: 110 bpm, Variability: Moderate, Accelerations: Reactive and Decelerations: Absent  Medications:  Scheduled    . betamethasone acetate-betamethasone sodium phosphate  12 mg Intramuscular Q24H  . CALCIUM CITRATE PLUS/MAGNESIUM  1 tablet Oral TID WC & HS  . fluconazole  150 mg Oral Daily  . metroNIDAZOLE  500 mg Oral Q12H  . NIFEdipine  30 mg Oral Daily  . prenatal vitamin w/FE, FA  1 tablet Oral QAC breakfast  . PROGESTERONE (VAGINAL)  1 application Vaginal Daily  . DISCONTD: docusate sodium  100 mg Oral Daily  . DISCONTD: famotidine  20 mg Oral Daily  . DISCONTD: PRENATAL ONE DAILY  1 tablet Oral Daily   I have reviewed the patient's current medications.  ASSESSMENT: Patient Active Problem List  Diagnoses  . ADJUSTMENT DISORDER  . ASTHMA  . WRIST PAIN, RIGHT  . LOW BACK PAIN SYNDROME  . COCCYGEAL PAIN  . DE QUERVAIN'S  TENOSYNOVITIS  . Preterm labor    PLAN: No cervical change.  She has received two doses of BMZ, on Procardia and progesterone gel. Will discharge to home, follow up with midwife OB Nabor Thomann this week. Routine PTL/FM precautions reviewed.   Alexandra [name] 06/11/2011,7:29 AM

## 2011-06-11 NOTE — Initial Assessments (Signed)
Discharge instructions reviewed with pt.  Pt verbalized understanding.  Pharmacy called to return pt's medications

## 2011-06-11 NOTE — Plan of Care (Signed)
Problem: Consults Goal: Birthing Suites Patient Information Press F2 to bring up selections list  Outcome: Completed/Met Date Met:  06/11/11  Pt 32 weeks Goal: Neonatologist Consult Outcome: Not Progressing Neo called this am by night shift.  MD has not come to consult with pt and husband

## 2011-06-11 NOTE — Initial Assessments (Signed)
Asked pt if she needs to talk to neo before leaving.  Pt stated her last baby was in NICU here and feels comfortable with what to expect if this baby comes early.  Pt discharged in stable condition with husband present per w/c

## 2011-06-12 ENCOUNTER — Other Ambulatory Visit: Payer: Self-pay | Admitting: Emergency Medicine

## 2011-06-12 LAB — CULTURE, BETA STREP (GROUP B ONLY)

## 2011-06-12 MED ORDER — METRONIDAZOLE 500 MG PO TABS: 500.0000 mg | ORAL_TABLET | Freq: Two times a day (BID) | ORAL | Status: AC

## 2011-07-08 ENCOUNTER — Inpatient Hospital Stay (HOSPITAL_COMMUNITY)
Admission: AD | Admit: 2011-07-08 | Discharge: 2011-07-10 | DRG: 776 | Disposition: A | Discharge status: Home / Self Care | Payer: 59 | Source: Ambulatory Visit | Attending: Obstetrics & Gynecology | Admitting: Obstetrics & Gynecology

## 2011-07-08 ENCOUNTER — Encounter (HOSPITAL_COMMUNITY): Payer: Self-pay | Admitting: *Deleted

## 2011-07-08 LAB — CBC
MCV: 90.1 fL (ref 78.0–100.0)
Platelets: 138 10*3/uL — ABNORMAL LOW (ref 150–400)
RBC: 3.75 MIL/uL — ABNORMAL LOW (ref 3.87–5.11)
WBC: 17.8 10*3/uL — ABNORMAL HIGH (ref 4.0–10.5)

## 2011-07-08 MED ORDER — DIPHENHYDRAMINE HCL 25 MG PO CAPS: 25.0000 mg | ORAL_CAPSULE | Freq: Four times a day (QID) | ORAL | Status: DC | PRN

## 2011-07-08 MED ORDER — FLEET ENEMA 7-19 GM/118ML RE ENEM: 1.0000 | ENEMA | RECTAL | Status: DC | PRN

## 2011-07-08 MED ORDER — WITCH HAZEL-GLYCERIN EX PADS: 1.0000 "application " | MEDICATED_PAD | CUTANEOUS | Status: DC | PRN

## 2011-07-08 MED ORDER — IBUPROFEN 600 MG PO TABS
600.0000 mg | ORAL_TABLET | Freq: Four times a day (QID) | ORAL | Status: DC | PRN
  Filled 2011-07-08: qty 1

## 2011-07-08 MED ORDER — LIDOCAINE HCL (PF) 1 % IJ SOLN
30.0000 mL | INTRAMUSCULAR | Status: DC | PRN
  Filled 2011-07-08: qty 30

## 2011-07-08 MED ORDER — PRENATAL PLUS 27-1 MG PO TABS
1.0000 | ORAL_TABLET | Freq: Every day | ORAL | Status: DC
  Administered 2011-07-09: 1 via ORAL
  Filled 2011-07-08 (×3): qty 1

## 2011-07-08 MED ORDER — LIDOCAINE HCL (PF) 1 % IJ SOLN
INTRAMUSCULAR | Status: AC
  Administered 2011-07-08: 30 mL
  Filled 2011-07-08: qty 30

## 2011-07-08 MED ORDER — IBUPROFEN 600 MG PO TABS
600.0000 mg | ORAL_TABLET | Freq: Four times a day (QID) | ORAL | Status: DC
  Administered 2011-07-08 – 2011-07-10 (×8): 600 mg via ORAL
  Filled 2011-07-08 (×8): qty 1

## 2011-07-08 MED ORDER — CITRIC ACID-SODIUM CITRATE 334-500 MG/5ML PO SOLN: 30.0000 mL | ORAL | Status: DC | PRN

## 2011-07-08 MED ORDER — ONDANSETRON HCL 4 MG/2ML IJ SOLN: 4.0000 mg | Freq: Four times a day (QID) | INTRAMUSCULAR | Status: DC | PRN

## 2011-07-08 MED ORDER — LANOLIN HYDROUS EX OINT: TOPICAL_OINTMENT | CUTANEOUS | Status: DC | PRN

## 2011-07-08 MED ORDER — ONDANSETRON HCL 4 MG PO TABS: 4.0000 mg | ORAL_TABLET | ORAL | Status: DC | PRN

## 2011-07-08 MED ORDER — OXYCODONE-ACETAMINOPHEN 5-325 MG PO TABS
1.0000 | ORAL_TABLET | ORAL | Status: DC | PRN
  Administered 2011-07-08: 1 via ORAL
  Filled 2011-07-08: qty 1

## 2011-07-08 MED ORDER — BENZOCAINE-MENTHOL 20-0.5 % EX AERO
1.0000 "application " | INHALATION_SPRAY | CUTANEOUS | Status: DC | PRN
  Filled 2011-07-08: qty 56

## 2011-07-08 MED ORDER — ACETAMINOPHEN 325 MG PO TABS: 650.0000 mg | ORAL_TABLET | ORAL | Status: DC | PRN

## 2011-07-08 MED ORDER — ZOLPIDEM TARTRATE 5 MG PO TABS: 5.0000 mg | ORAL_TABLET | Freq: Every evening | ORAL | Status: DC | PRN

## 2011-07-08 MED ORDER — OXYTOCIN 10 UNIT/ML IJ SOLN
INTRAMUSCULAR | Status: AC
  Filled 2011-07-08: qty 2

## 2011-07-08 MED ORDER — DIBUCAINE 1 % RE OINT
1.0000 "application " | TOPICAL_OINTMENT | RECTAL | Status: DC | PRN
  Filled 2011-07-08: qty 28

## 2011-07-08 MED ORDER — SENNOSIDES-DOCUSATE SODIUM 8.6-50 MG PO TABS
2.0000 | ORAL_TABLET | Freq: Every day | ORAL | Status: DC
  Administered 2011-07-08: 2 via ORAL

## 2011-07-08 MED ORDER — SIMETHICONE 80 MG PO CHEW: 80.0000 mg | CHEWABLE_TABLET | ORAL | Status: DC | PRN

## 2011-07-08 MED ORDER — HEPATITIS B IMMUNE GLOBULIN IM SOLN: 0.5000 mL | Freq: Once | INTRAMUSCULAR | Status: DC

## 2011-07-08 MED ORDER — ONDANSETRON HCL 4 MG/2ML IJ SOLN: 4.0000 mg | INTRAMUSCULAR | Status: DC | PRN

## 2011-07-08 MED ORDER — OXYCODONE-ACETAMINOPHEN 5-325 MG PO TABS: 2.0000 | ORAL_TABLET | ORAL | Status: DC | PRN

## 2011-07-08 NOTE — H&P (Signed)
Alexandra [name] is a 35 y.o. female presenting for delivery at home.  Patient states water broke on the toilet sometime after midnight.  A viable female infant was born shortly thereafter.  Per patient and husband report, baby was crying and moving well after delivery.  EMS was called and she was transported to [facility].  Has a history of preterm labor with this pregnancy and was on Procardia and vaginal progesterone cream.  Prenatal care at a midwife office in [place].  Planning on breast feeding and no contraception.  History OB History    Grav Para Term Preterm Abortions TAB SAB Ect Mult Living   2 2 0 2 0 0 0 0 0 2      Past Medical History  Diagnosis Date  . Gallbladder attack   . Gallstone   . Asthma   . Abnormal Pap smear     cryo 1999   Past Surgical History  Procedure Date  . Cholecystectomy   . Cryotherapy    Family History: family history includes Cancer in her maternal grandmother and mother; Heart disease in her maternal grandfather; and Hypertension in her father. Social History:  reports that she has never smoked. She has never used smokeless tobacco. She reports that she does not drink alcohol or use illicit drugs.  Allergies  Allergen Reactions  . Sulfamethoxazole W/Trimethoprim     REACTION: hives   No current facility-administered medications on file prior to encounter.   Current Outpatient Prescriptions on File Prior to Encounter  Medication Sig Dispense Refill  . Calcium-Magnesium (CAL-MAG) 500-250 MG TABS Take 1 tablet by mouth daily.        Marland Kitchen NIFEdipine (PROCARDIA XL) 30 MG (OSM) 24 hr tablet Take 1 tablet (30 mg total) by mouth daily.  4 tablet  0  . NIFEdipine (PROCARDIA-XL/ADALAT CC) 30 MG 24 hr tablet Take 1 tablet (30 mg total) by mouth daily. Can increase to twice daily if contractions are more frequent  30 tablet  2  . PE-Shark Liver Oil-Cocoa Buttr (HEMORRHOID RE) Place 1 application rectally daily as needed.        . prenatal vitamin w/FE, FA  (PRENATAL 1 + 1) 27-1 MG TABS Take 1 tablet by mouth daily.        Marland Kitchen PROGESTERONE, VAGINAL, (CRINONE) 8 % GEL Place 1 application vaginally daily.        . ranitidine (ZANTAC) 75 MG tablet Take 75 mg by mouth daily as needed.           ROS See HPI    Blood pressure 105/65, pulse 68, resp. rate 20, unknown if currently breastfeeding. Exam Physical Exam  Constitutional: She is oriented to person, place, and time. She appears well-developed and well-nourished. She appears distressed (with uterine cramping).  HENT:  Mouth/Throat: Oropharynx is clear and moist.  Eyes: No scleral icterus.  Neck: Normal range of motion.  GI:       Fundus firm  Genitourinary:       2nd degree perineal laceration noted and repaired  Musculoskeletal: She exhibits no edema.  Neurological: She is alert and oriented to person, place, and time.  Skin: Skin is warm and dry.  Psychiatric: She has a normal mood and affect. Her behavior is normal.    Prenatal labs: ABO, Rh:  A neg Antibody:   Rubella:   RPR:    HBsAg:    HIV:    GBS:   neg  Assessment/Plan: 35 year old G2P0202 who delivered a 71.3  female infant at home.  Placenta delivered and perineal laceration repaired.  Declined IM pitocin. -will admit to postpartum with routine care -plans to breastfeed and no contraception   BOOTH, Shawndrea Rutkowski 07/08/2011, 1:38 AM

## 2011-07-09 MED ORDER — RHO D IMMUNE GLOBULIN 1500 UNIT/2ML IJ SOLN
300.0000 ug | Freq: Once | INTRAMUSCULAR | Status: AC
  Administered 2011-07-09: 300 ug via INTRAMUSCULAR
  Filled 2011-07-09: qty 2

## 2011-07-09 NOTE — Progress Notes (Signed)
Post Partum Day 1; Preterm delivery at home  Subjective: up ad lib, voiding, tolerating PO and +tailbone pain; pain relieved with meds.  Breastfeeding well.  Desires dischage home if infant discharged.    Objective: Blood pressure 90/48, pulse 72, temperature 98 F (36.7 C), temperature source Oral, resp. rate 18, height 5\' 5"  (1.651 m), weight 65.318 kg (144 lb), SpO2 98.00%, unknown if currently breastfeeding.  Physical Exam:  General: alert, cooperative and appears stated age Lochia: appropriate Uterine Fundus: firm; 2 below the umbilicus Incision: n/a DVT Evaluation: No evidence of DVT seen on physical exam.   Basename 07/08/11 0230  HGB 11.7*  HCT 33.8*    Assessment/Plan: Discharge home, Breastfeeding and Contraception  May stay if infant not discharged home.   LOS: 1 day   [facility] 07/09/2011, 7:21 AM

## 2011-07-09 NOTE — Discharge Summary (Signed)
Obstetric Discharge Summary Reason for Admission: Preterm Delivery at Home Prenatal Procedures: ultrasound Intrapartum Procedures: Laceration Repair Postpartum Procedures: none and Rhogam prior to DC Complications-Operative and Postpartum: none Hemoglobin  Date Value Range Status  07/08/2011 11.7* 12.0-15.0 (g/dL) Final     HCT  Date Value Range Status  07/08/2011 33.8* 36.0-46.0 (%) Final    Discharge Diagnoses: Premature labor and Preterm Delivery  Discharge Information: Date: 07/09/2011 Activity: unrestricted and pelvic rest Diet: routine Medications: Ibuprofen Condition: stable Instructions: Refer to handout provided Discharge to: home Follow-up Information    Follow up with [facility] CNM. Make an appointment in 6 weeks.         Newborn Data: Live born female  Birth Weight: 6 lb 0.7 oz (2740 g) APGAR: ,   Home with uncertain when infant may be discharged home.Marland Kitchen  [facility] 07/09/2011, 7:24 AM

## 2011-07-10 LAB — RH IG WORKUP (INCLUDES ABO/RH)
ABO/RH(D): A NEG
Antibody Screen: POSITIVE
DAT, IgG: NEGATIVE
Gestational Age(Wks): 37
Unit division: 0

## 2011-07-10 MED ORDER — IBUPROFEN 600 MG PO TABS: 600.0000 mg | ORAL_TABLET | Freq: Four times a day (QID) | ORAL | Status: AC | PRN

## 2011-07-10 NOTE — Progress Notes (Signed)
Post Partum Day 2 Subjective: no complaints, up ad lib, voiding, tolerating PO and + flatus  Objective: Blood pressure 101/69, pulse 70, temperature 97.9 F (36.6 C), temperature source Oral, resp. rate 18, height 5\' 5"  (1.651 m), weight 65.318 kg (144 lb), SpO2 98.00%, unknown if currently breastfeeding.  Physical Exam:  General: alert, cooperative, appears stated age and no distress Lochia: appropriate Uterine Fundus: firm Incision: n/a DVT Evaluation: No evidence of DVT seen on physical exam. Negative Homan's sign. No cords or calf tenderness. No significant calf/ankle edema.   Basename 07/08/11 0230  HGB 11.7*  HCT 33.8*    Assessment/Plan: Discharge home and Breastfeeding   LOS: 2 days   Alexandra [name] 07/10/2011, 7:19 AM

## 2011-07-10 NOTE — Discharge Summary (Signed)
Obstetric Discharge Summary Reason for Admission: del @ home Prenatal Procedures: ultrasound Intrapartum Procedures: spontaneous vaginal delivery Postpartum Procedures: none Complications-Operative and Postpartum: none Hemoglobin  Date Value Range Status  07/08/2011 11.7* 12.0-15.0 (g/dL) Final     HCT  Date Value Range Status  07/08/2011 33.8* 36.0-46.0 (%) Final    Discharge Diagnoses: preterm del @36  wks  Discharge Information: Date: 07/10/2011 Activity: pelvic rest Diet: routine Medications: PNV and Ibuprofen Condition: stable and improved Instructions: refer to practice specific booklet Discharge to: home Follow-up Information    Follow up with [facility] CNM. Make an appointment in 6 weeks.         Newborn Data: Live born female  Birth Weight: 6 lb 0.7 oz (2740 g) APGAR: ,   Home with mother.  Zerita Boers 07/10/2011, 7:23 AM

## 2011-07-10 NOTE — Progress Notes (Signed)
UR chart review completed.  

## 2011-07-12 NOTE — Discharge Summary (Signed)
Agree with above note.  LEGGETT,KELLY H. 07/12/2011 5:20 AM

## 2011-09-05 ENCOUNTER — Encounter: Payer: Self-pay | Admitting: Family

## 2011-09-05 ENCOUNTER — Ambulatory Visit (INDEPENDENT_AMBULATORY_CARE_PROVIDER_SITE_OTHER): Payer: 59 | Admitting: Family

## 2011-09-05 VITALS — BP 106/78 | HR 109 | Temp 98.2°F | Resp 18 | Wt 131.0 lb

## 2011-09-05 DIAGNOSIS — J029 Acute pharyngitis, unspecified: Secondary | ICD-10-CM

## 2011-09-05 DIAGNOSIS — R059 Cough, unspecified: Secondary | ICD-10-CM

## 2011-09-05 DIAGNOSIS — R05 Cough: Secondary | ICD-10-CM

## 2011-09-05 LAB — POCT INFLUENZA A/B
Influenza A, POC: NEGATIVE
Influenza B, POC: NEGATIVE

## 2011-09-05 NOTE — Patient Instructions (Signed)
Call if your symptoms worsen or if you are not feeling better in 2-3 days.    Upper Respiratory Infection, Adult An upper respiratory infection (URI) is also known as the common cold. It is often caused by a type of germ (virus). Colds are easily spread (contagious). You can pass it to others by kissing, coughing, sneezing, or drinking out of the same glass. Usually, you get better in 1 or 2 weeks.  HOME CARE   Only take medicine as told by your doctor.   Use a warm mist humidifier or breathe in steam from a hot shower.   Drink enough water and fluids to keep your pee (urine) clear or pale yellow.   Get plenty of rest.   Return to work when your temperature is back to normal or as told by your doctor. You may use a face mask and wash your hands to stop your cold from spreading.  GET HELP RIGHT AWAY IF:   After the first few days, you feel you are getting worse.   You have questions about your medicine.   You have chills, shortness of breath, or brown or red spit (mucus).   You have yellow or brown snot (nasal discharge) or pain in the face, especially when you bend forward.   You have a fever, puffy (swollen) neck, pain when you swallow, or white spots in the back of your throat.   You have a bad headache, ear pain, sinus pain, or chest pain.   You have a high-pitched whistling sound when you breathe in and out (wheezing).   You have a lasting cough or cough up blood.   You have sore muscles or a stiff neck.  MAKE SURE YOU:   Understand these instructions.   Will watch your condition.   Will get help right away if you are not doing well or get worse.  Document Released: 03/13/2008 Document Revised: 06/07/2011 Document Reviewed: 01/30/2011 [facility] Patient Information 2012 [place], [place].

## 2011-09-05 NOTE — Progress Notes (Signed)
  Subjective:    Patient ID: Alexandra [name], female    DOB: 22-Nov-1975, 35 y.o.   MRN: [id number]  HPI  Pt presents with chief complaint of sore throat. Symptoms started yesterday.  By dinner time she had cough. This AM she had a fever 100, myalgias, cough.  She tried motrin.  She is nursing her 62 month old.     Review of Systems See HPI  Past Medical History  Diagnosis Date  . Gallbladder attack   . Gallstone   . Asthma   . Abnormal Pap smear     cryo 1999    History   Social History  . Marital Status: Married    Spouse Name: N/A    Number of Children: N/A  . Years of Education: N/A   Occupational History  . Not on file.   Social History Main Topics  . Smoking status: Never Smoker   . Smokeless tobacco: Never Used  . Alcohol Use: No  . Drug Use: No  . Sexually Active: Yes    Birth Control/ Protection: None   Other Topics Concern  . Not on file   Social History Narrative  . No narrative on file    Past Surgical History  Procedure Date  . Cholecystectomy   . Cryotherapy     Family History  Problem Relation Age of Onset  . Cancer Mother   . Hypertension Father   . Cancer Maternal Grandmother   . Heart disease Maternal Grandfather     Allergies  Allergen Reactions  . Sulfamethoxazole W/Trimethoprim     REACTION: hives    Current Outpatient Prescriptions on File Prior to Visit  Medication Sig Dispense Refill  . prenatal vitamin w/FE, FA (PRENATAL 1 + 1) 27-1 MG TABS Take 1 tablet by mouth daily.          BP 106/78  Pulse 109  Temp(Src) 98.2 F (36.8 C) (Oral)  Resp 18  Wt 131 lb 0.6 oz (59.439 kg)  LMP 10/27/2010        Objective:   Physical Exam  Constitutional: She appears well-developed and well-nourished. No distress.  HENT:  Head: Normocephalic and atraumatic.  Right Ear: Tympanic membrane and ear canal normal.  Left Ear: Tympanic membrane and ear canal normal.  Mouth/Throat: Posterior oropharyngeal erythema present. No  oropharyngeal exudate, posterior oropharyngeal edema or tonsillar abscesses.  Eyes: Conjunctivae are normal.  Lymphadenopathy:    She has no cervical adenopathy.  Psychiatric: She has a normal mood and affect. Her behavior is normal. Judgment and thought content normal.          Assessment & Plan:

## 2011-09-09 DIAGNOSIS — J069 Acute upper respiratory infection, unspecified: Secondary | ICD-10-CM | POA: Insufficient documentation

## 2011-09-09 NOTE — Assessment & Plan Note (Addendum)
Flu screen is negative.  Recommended conservative treatment as outlined in AVS. Pt to call if symptoms worsen or if they do not improve.

## 2011-10-11 ENCOUNTER — Encounter: Payer: Self-pay | Admitting: Family Medicine

## 2011-10-11 ENCOUNTER — Ambulatory Visit (INDEPENDENT_AMBULATORY_CARE_PROVIDER_SITE_OTHER): Payer: 59 | Admitting: Family Medicine

## 2011-10-11 VITALS — BP 105/75 | HR 85 | Temp 97.7°F | Ht 65.5 in | Wt 131.4 lb

## 2011-10-11 DIAGNOSIS — K649 Unspecified hemorrhoids: Secondary | ICD-10-CM | POA: Insufficient documentation

## 2011-10-11 MED ORDER — DIBUCAINE 1 % RE OINT: 1.0000 "application " | TOPICAL_OINTMENT | Freq: Three times a day (TID) | RECTAL | Status: DC | PRN

## 2011-10-11 MED ORDER — HYDROCORTISONE ACE-PRAMOXINE 2.5-1 % RE CREA: TOPICAL_CREAM | Freq: Three times a day (TID) | RECTAL | Status: AC

## 2011-10-11 NOTE — Progress Notes (Signed)
  Subjective:    Patient ID: Alexandra [name], female    DOB: [birth year], 36 y.o.   MRN: [id number]  HPI Hemorrhoids- has had them since first pregnancy.  + pain, BRB.  Some itching.  Using preparation H.  Uncomfortable to sit.   Review of Systems For ROS see HPI     Objective:   Physical Exam  Vitals reviewed. Constitutional: She appears well-developed and well-nourished. No distress.  Genitourinary: Rectal exam shows external hemorrhoid (not thrombosed, mildly painful to palpation, no active bleeding).          Assessment & Plan:

## 2011-10-11 NOTE — Patient Instructions (Signed)
Someone will call you with your GI appt Use the Analpram to heal the hemorrhoid and help w/ itching Use the Nupercainal for pain Call with any questions or concerns Happy New Year!!

## 2011-10-13 ENCOUNTER — Encounter: Payer: Self-pay | Admitting: Gastroenterology

## 2011-10-19 ENCOUNTER — Encounter: Payer: Self-pay | Admitting: *Deleted

## 2011-10-19 NOTE — Assessment & Plan Note (Signed)
Start topical meds, refer to GI for definitive tx.  Reviewed supportive care and red flags that should prompt return.  Pt expressed understanding and is in agreement w/ plan.

## 2011-10-24 ENCOUNTER — Ambulatory Visit (INDEPENDENT_AMBULATORY_CARE_PROVIDER_SITE_OTHER): Payer: 59 | Admitting: Gastroenterology

## 2011-10-24 ENCOUNTER — Encounter: Payer: Self-pay | Admitting: Gastroenterology

## 2011-10-24 VITALS — BP 102/64 | HR 76 | Ht 65.0 in | Wt 132.0 lb

## 2011-10-24 DIAGNOSIS — Z1211 Encounter for screening for malignant neoplasm of colon: Secondary | ICD-10-CM

## 2011-10-24 DIAGNOSIS — Z8719 Personal history of other diseases of the digestive system: Secondary | ICD-10-CM

## 2011-10-24 DIAGNOSIS — K644 Residual hemorrhoidal skin tags: Secondary | ICD-10-CM

## 2011-10-24 DIAGNOSIS — Z800 Family history of malignant neoplasm of digestive organs: Secondary | ICD-10-CM

## 2011-10-24 NOTE — Patient Instructions (Signed)
Use Calmoseptine ointment as needed per rectum.  Please go to the basement today for your labs.

## 2011-10-24 NOTE — Progress Notes (Signed)
History of Present Illness:  This is a very pleasant 36 year old Caucasian female with 2 infant children , referred for evaluation of recurrent rectal itching and discomfort. The patient is currently breast-feeding a 72-month-old infant, and does not want to take any medications if possible. With her first pregnancy ,she had severe acid reflux problems managed by over-the-counter Zantac, and she continues to use this as needed. There is no history of hepatobiliary problems, dysphagia, or bowel irregularity. Also with her pregnancy ,she's had rectal itching and discomfort but no rectal bleeding or lower bowel pain. She had her gallbladder removed between pregnancies in 2011 by Dr. Corliss Skains. Her history is positive for colon cancer in her paternal grand mother, but other family members health status is very vague. She has used some Analpram cream locally, and currently is fairly asymptomatic.  I have reviewed this patient's present history, medical and surgical past history, allergies and medications.     ROS: The remainder of the 10 point ROS is negative     Physical Exam: General well developed well nourished patient in no acute distress, appearing her stated age Eyes PERRLA, no icterus, fundoscopic exam per opthamologist Skin no lesions noted Neck supple, no adenopathy, no thyroid enlargement, no tenderness Chest clear to percussion and auscultation Heart no significant murmurs, gallops or rubs noted Abdomen no hepatosplenomegaly masses or tenderness, BS normal.  Rectal inspection normal no fissures, or fistulae noted.  No masses or tenderness on digital exam. Stool guaiac negative. There is a posterior skin tag noted without active fissure or fistula. Extremities no acute joint lesions, edema, phlebitis or evidence of cellulitis. Neurologic patient oriented x 3, cranial nerves intact, no focal neurologic deficits noted. Psychological mental status normal and normal affect.  Assessment and plan:  Probable rectal fissure which has healed with resultant posterior skin tag. I do not see any active hemorrhoids or inflammatory processes at this. I have asked her to use when necessary Calmoseptine ointment to her anal area,and recommend the use cotton balls as needed for moisture absorption in the perirectal area. She has no symptoms of malabsorption or chronic diarrhea to suggest IBD. She follows a somewhat unusual diet that centers around mostly seafood , dairy products, and carbohydrates. There is no family history of celiac disease, and the patient denies recurrent skin rashes or mouth sores. She is to find out more about her family history in terms of colon cancer so we can advise her as to colonoscopy screening. I suspect age 19 would be a good starting point for this patient. She has no post cholecystectomy symptomatology. Review of her laboratory parameters shows no abnormalities. I advised Pepcid AC on a when necessary basis per her infrequent GERD.  No diagnosis found.

## 2011-11-16 ENCOUNTER — Other Ambulatory Visit: Payer: 59

## 2011-11-16 LAB — FECAL OCCULT BLOOD, IMMUNOCHEMICAL: Fecal Occult Bld: NEGATIVE

## 2011-12-28 ENCOUNTER — Encounter: Payer: Self-pay | Admitting: Family Medicine

## 2011-12-28 ENCOUNTER — Ambulatory Visit (INDEPENDENT_AMBULATORY_CARE_PROVIDER_SITE_OTHER): Payer: 59 | Admitting: Family Medicine

## 2011-12-28 VITALS — BP 98/62 | HR 90 | Temp 98.2°F | Wt 129.0 lb

## 2011-12-28 DIAGNOSIS — R062 Wheezing: Secondary | ICD-10-CM

## 2011-12-28 DIAGNOSIS — J329 Chronic sinusitis, unspecified: Secondary | ICD-10-CM

## 2011-12-28 DIAGNOSIS — H6690 Otitis media, unspecified, unspecified ear: Secondary | ICD-10-CM

## 2011-12-28 DIAGNOSIS — J45909 Unspecified asthma, uncomplicated: Secondary | ICD-10-CM

## 2011-12-28 MED ORDER — ALBUTEROL SULFATE (5 MG/ML) 0.5% IN NEBU
2.5000 mg | INHALATION_SOLUTION | Freq: Once | RESPIRATORY_TRACT | Status: AC
  Administered 2011-12-28: 2.5 mg via RESPIRATORY_TRACT

## 2011-12-28 MED ORDER — ALBUTEROL SULFATE HFA 108 (90 BASE) MCG/ACT IN AERS: 2.0000 | INHALATION_SPRAY | Freq: Four times a day (QID) | RESPIRATORY_TRACT | Status: DC | PRN

## 2011-12-28 MED ORDER — CEFUROXIME AXETIL 500 MG PO TABS: 500.0000 mg | ORAL_TABLET | Freq: Two times a day (BID) | ORAL | Status: AC

## 2011-12-28 MED ORDER — BECLOMETHASONE DIPROPIONATE 80 MCG/ACT IN AERS: 2.0000 | INHALATION_SPRAY | Freq: Two times a day (BID) | RESPIRATORY_TRACT | Status: AC

## 2011-12-28 NOTE — Progress Notes (Signed)
  Subjective:     Alexandra [name] is a 36 y.o. female here for evaluation of a cough. Onset of symptoms was 4 days ago. Symptoms have been gradually worsening since that time. The cough is productive and is aggravated by exercise and reclining position. Associated symptoms include: chills, fever, shortness of breath, sputum production and wheezing. Patient does have a history of asthma. Patient does have a history of environmental allergens. Patient has not traveled recently. Patient does not have a history of smoking. Patient has not had a previous chest x-ray. Patient has not had a PPD done.  The following portions of the patient's history were reviewed and updated as appropriate: allergies, current medications, past family history, past medical history, past social history, past surgical history and problem list.  Review of Systems Pertinent items are noted in HPI.    Objective:    Oxygen saturation 94% on room air BP 98/62  Pulse 90  Temp(Src) 98.2 F (36.8 C) (Oral)  Wt 129 lb (58.514 kg)  SpO2 94% General appearance: alert, cooperative, appears stated age and no distress Ears: normal TM and external ear canal right ear and abnormal TM left ear - diminished mobility, erythematous and retracted Nose: green discharge, mild congestion, sinus tenderness bilateral Throat: lips, mucosa, and tongue normal; teeth and gums normal Neck: no adenopathy, supple, symmetrical, trachea midline and thyroid not enlarged, symmetric, no tenderness/mass/nodules Lungs: diminished breath sounds bilaterally and wheezes bilaterally Heart: S1, S2 normal    Assessment:    Sinusitis and L OM with exacerbation asthma    Plan:    Antibiotics per medication orders. Antitussives per medication orders. Avoid exposure to tobacco smoke and fumes. B-agonist inhaler. Call if shortness of breath worsens, blood in sputum, change in character of cough, development of fever or chills, inability to maintain  nutrition and hydration. Avoid exposure to tobacco smoke and fumes. Steroid inhaler as ordered.

## 2012-05-27 ENCOUNTER — Encounter: Payer: Self-pay | Admitting: *Deleted

## 2012-05-27 ENCOUNTER — Ambulatory Visit (INDEPENDENT_AMBULATORY_CARE_PROVIDER_SITE_OTHER): Payer: 59 | Admitting: Family Medicine

## 2012-05-27 ENCOUNTER — Encounter: Payer: Self-pay | Admitting: Family Medicine

## 2012-05-27 VITALS — BP 102/68 | HR 90 | Temp 97.7°F | Ht 64.75 in | Wt 126.0 lb

## 2012-05-27 DIAGNOSIS — D229 Melanocytic nevi, unspecified: Secondary | ICD-10-CM

## 2012-05-27 DIAGNOSIS — D239 Other benign neoplasm of skin, unspecified: Secondary | ICD-10-CM

## 2012-05-27 DIAGNOSIS — Z0000 Encounter for general adult medical examination without abnormal findings: Secondary | ICD-10-CM

## 2012-05-27 LAB — LIPID PANEL
Cholesterol: 194 mg/dL (ref 0–200)
LDL Cholesterol: 116 mg/dL — ABNORMAL HIGH (ref 0–99)
Total CHOL/HDL Ratio: 3
Triglycerides: 83 mg/dL (ref 0.0–149.0)
VLDL: 16.6 mg/dL (ref 0.0–40.0)

## 2012-05-27 LAB — CBC WITH DIFFERENTIAL/PLATELET
Basophils Absolute: 0.1 10*3/uL (ref 0.0–0.1)
Eosinophils Absolute: 0.1 10*3/uL (ref 0.0–0.7)
Lymphocytes Relative: 41.4 % (ref 12.0–46.0)
MCHC: 33.1 g/dL (ref 30.0–36.0)
Monocytes Relative: 6.6 % (ref 3.0–12.0)
Neutrophils Relative %: 48.8 % (ref 43.0–77.0)
Platelets: 261 10*3/uL (ref 150.0–400.0)
RDW: 13.6 % (ref 11.5–14.6)

## 2012-05-27 LAB — HEPATIC FUNCTION PANEL
ALT: 16 U/L (ref 0–35)
Alkaline Phosphatase: 81 U/L (ref 39–117)
Bilirubin, Direct: 0 mg/dL (ref 0.0–0.3)
Total Bilirubin: 0.6 mg/dL (ref 0.3–1.2)
Total Protein: 7.8 g/dL (ref 6.0–8.3)

## 2012-05-27 LAB — BASIC METABOLIC PANEL
CO2: 25 mEq/L (ref 19–32)
Chloride: 106 mEq/L (ref 96–112)
Sodium: 140 mEq/L (ref 135–145)

## 2012-05-27 NOTE — Assessment & Plan Note (Signed)
New.  Posterior L lower leg.  Now w/ darker center.  Refer to derm.

## 2012-05-27 NOTE — Patient Instructions (Addendum)
We'll notify you of your lab results Someone will call you with your derm appt Keep up the good work!  You look great! Call with any questions or concerns Enjoy what's left of summer!

## 2012-05-27 NOTE — Progress Notes (Signed)
  Subjective:    Patient ID: Alexandra [name], female    DOB: 06-04-1976, 36 y.o.   MRN: [id number]  HPI CPE- UTD on GYN, no concerns today.   Review of Systems Patient reports no vision/ hearing changes, adenopathy,fever, weight change,  persistant/recurrent hoarseness , swallowing issues, chest pain, palpitations, edema, persistant/recurrent cough, hemoptysis, dyspnea (rest/exertional/paroxysmal nocturnal), gastrointestinal bleeding (melena, rectal bleeding), abdominal pain, significant heartburn, bowel changes, GU symptoms (dysuria, hematuria, incontinence), Gyn symptoms (abnormal  bleeding, pain),  syncope, focal weakness, memory loss, numbness & tingling, skin/hair/nail changes, abnormal bruising or bleeding, anxiety, or depression.     Objective:   Physical Exam General Appearance:    Alert, cooperative, no distress, appears stated age  Head:    Normocephalic, without obvious abnormality, atraumatic  Eyes:    PERRL, conjunctiva/corneas clear, EOM's intact, fundi    benign, both eyes  Ears:    Normal TM's and external ear canals, both ears  Nose:   Nares normal, septum midline, mucosa normal, no drainage    or sinus tenderness  Throat:   Lips, mucosa, and tongue normal; teeth and gums normal  Neck:   Supple, symmetrical, trachea midline, no adenopathy;    Thyroid: no enlargement/tenderness/nodules  Back:     Symmetric, no curvature, ROM normal, no CVA tenderness  Lungs:     Clear to auscultation bilaterally, respirations unlabored  Chest Wall:    No tenderness or deformity   Heart:    Regular rate and rhythm, S1 and S2 normal, no murmur, rub   or gallop  Breast Exam:    Deferred to GYN  Abdomen:     Soft, non-tender, bowel sounds active all four quadrants,    no masses, no organomegaly  Genitalia:    Deferred to GYN  Rectal:    Extremities:   Extremities normal, atraumatic, no cyanosis or edema  Pulses:   2+ and symmetric all extremities  Skin:   Skin color, texture, turgor  normal, no rashes or lesions.  1 cm atypical nevus on posterior L lower leg  Lymph nodes:   Cervical, supraclavicular, and axillary nodes normal  Neurologic:   CNII-XII intact, normal strength, sensation and reflexes    throughout          Assessment & Plan:

## 2012-05-27 NOTE — Assessment & Plan Note (Signed)
Pt's PE WNL w/ exception of atypical nevus.  UTD on GYN.  Check labs.  Anticipatory guidance provided.

## 2012-05-30 LAB — VITAMIN D 1,25 DIHYDROXY
Vitamin D2 1, 25 (OH)2: 9 pg/mL
Vitamin D3 1, 25 (OH)2: 97 pg/mL

## 2012-08-19 ENCOUNTER — Other Ambulatory Visit: Payer: Self-pay | Admitting: Surgery

## 2012-08-29 ENCOUNTER — Encounter: Payer: Self-pay | Admitting: Family Medicine

## 2012-08-29 ENCOUNTER — Ambulatory Visit (INDEPENDENT_AMBULATORY_CARE_PROVIDER_SITE_OTHER): Payer: 59 | Admitting: Family Medicine

## 2012-08-29 VITALS — BP 100/62 | HR 85 | Temp 98.6°F | Wt 127.0 lb

## 2012-08-29 DIAGNOSIS — C4370 Malignant melanoma of unspecified lower limb, including hip: Secondary | ICD-10-CM

## 2012-08-29 DIAGNOSIS — Z85820 Personal history of malignant melanoma of skin: Secondary | ICD-10-CM | POA: Insufficient documentation

## 2012-08-29 DIAGNOSIS — R29898 Other symptoms and signs involving the musculoskeletal system: Secondary | ICD-10-CM

## 2012-08-29 MED ORDER — MELOXICAM 15 MG PO TABS: 15.0000 mg | ORAL_TABLET | Freq: Every day | ORAL | Status: DC

## 2012-08-29 NOTE — Assessment & Plan Note (Signed)
New.  Carpal tunnel vs ulnar entrapment vs DeQuervains.  Start daily NSAIDs, continue carpal tunnel bracing, refer to hand surgery.  Pt expressed understanding and is in agreement w/ plan.

## 2012-08-29 NOTE — Progress Notes (Signed)
  Subjective:    Patient ID: Alexandra [name], female    DOB: November 23, 1975, 36 y.o.   MRN: [id number]  HPI Melanoma- L leg, just found out this week.  Has appt w/ Dr Harlon Flor on 12/6.  Level II melanoma, 0.49 mm.  Carpal tunnel- bilateral, has been worsening since birth of 2nd child.  Wearing braces nightly.  L arm is 'pretty good'.  R arm remains painful in elbow and forearm.  R grip is weak and painful.   Review of Systems For ROS see HPI     Objective:   Physical Exam  Vitals reviewed. Constitutional: She is oriented to person, place, and time. She appears well-developed and well-nourished. No distress.  Musculoskeletal:       No TTP over lateral or medial epicondyles on R No pain w/ flexion or extension of elbows R grip slightly weaker than L  Neurological: She is alert and oriented to person, place, and time. She has normal reflexes. No cranial nerve deficit. Coordination normal.          Assessment & Plan:

## 2012-08-29 NOTE — Assessment & Plan Note (Signed)
New.  Pt informed this week that mole removed last week is melanoma.  Has appt upcoming w/ surgery.  Will continue to follow and assist as able.

## 2012-08-29 NOTE — Patient Instructions (Addendum)
We'll notify you of your hand surgery appt Start the Mobic daily Wear your braces nightly Call with any questions or concerns Happy Holidays!!!

## 2012-09-13 ENCOUNTER — Encounter (HOSPITAL_COMMUNITY): Payer: Self-pay | Admitting: Pharmacy Technician

## 2012-09-13 ENCOUNTER — Encounter (INDEPENDENT_AMBULATORY_CARE_PROVIDER_SITE_OTHER): Payer: Self-pay | Admitting: Surgery

## 2012-09-13 ENCOUNTER — Ambulatory Visit (INDEPENDENT_AMBULATORY_CARE_PROVIDER_SITE_OTHER): Payer: 59 | Admitting: Surgery

## 2012-09-13 VITALS — BP 128/62 | HR 91 | Temp 97.6°F | Resp 18 | Ht 65.0 in | Wt 125.4 lb

## 2012-09-13 DIAGNOSIS — C4370 Malignant melanoma of unspecified lower limb, including hip: Secondary | ICD-10-CM

## 2012-09-13 NOTE — Progress Notes (Signed)
Patient ID: Alexandra [name], female   DOB: 01/10/1976, 36 y.o.   MRN: [id number]  Chief Complaint  Patient presents with  . Establish Care     Lft Calf  mel    HPI Alexandra [name] is a 36 y.o. female.  Referred by Alexandra Anderson, PA at [facility] for evaluation of left leg melanoma HPI This is a healthy 36-year-old female status post lap scalpel cholecystectomy in 2011. The patient is doing quite well from abdominal standpoint. She has had a dark mole on the back of her left leg for many years. However this seemed to change and her husband observed these changes. She was referred to the dermatologist for evaluation. She underwent a shave removal of the mole. Pathology showed superficial spreading malignant melanoma with a Clark's level II and rose low depth of 0.49 mm. No ulceration or satellite nodules were noted. No vascular invasion identified. Based on their note the lesion measured 10 x 9 mm prior to removal. She comes in today to discuss wide excision. He has no other suspicious skin lesions.  Past Medical History  Diagnosis Date  . Gallbladder attack   . Gallstone   . Asthma   . Abnormal Pap smear     cryo 1999  . Family history of malignant neoplasm of gastrointestinal tract   . Hx: UTI (urinary tract infection)   . Hemorrhoids   . Melanoma of lower limb     Past Surgical History  Procedure Date  . Cholecystectomy   . Cryotherapy     Family History  Problem Relation Age of Onset  . Hypertension Father   . Breast cancer Maternal Grandmother   . Heart disease Maternal Grandfather   . Colon cancer      Paternal Great Uncle   . Diabetes Paternal Grandfather     Social History History  Substance Use Topics  . Smoking status: Never Smoker   . Smokeless tobacco: Never Used  . Alcohol Use: No    Allergies  Allergen Reactions  . Sulfamethoxazole W-Trimethoprim     REACTION: hives    Meds:  None  Review of System Review of Systems  Constitutional:  Negative for fever, chills and unexpected weight change.  HENT: Negative for hearing loss, congestion, sore throat, trouble swallowing and voice change.   Eyes: Negative for visual disturbance.  Respiratory: Negative for cough and wheezing.   Cardiovascular: Negative for chest pain, palpitations and leg swelling.  Gastrointestinal: Negative for nausea, vomiting, abdominal pain, diarrhea, constipation, blood in stool, abdominal distention and anal bleeding.  Genitourinary: Negative for hematuria, vaginal bleeding and difficulty urinating.  Musculoskeletal: Negative for arthralgias.  Skin: Positive for wound. Negative for rash.  Neurological: Negative for seizures, syncope and headaches.  Hematological: Negative for adenopathy. Does not bruise/bleed easily.  Psychiatric/Behavioral: Negative for confusion.    Blood pressure 128/62, pulse 91, temperature 97.6 F (36.4 C), temperature source Temporal, resp. rate 18, height 5' 5" (1.651 m), weight 125 lb 6.4 oz (56.881 kg).  Physical Exam Physical Exam WDWN in NAD No inguinal lymphadenopathy Left posterior calf - 1 cm round wound - healing with no sign of infection; no satellite lesions noted.  Data Reviewed Path report   Assessment    Melanoma - left posterior leg - .49 mm depth    Plan    In the absence of any lymphadenopathy or other worrisome findings, wide excision should be adequate treatment.  We will plan excision with at least 1 cm margins and   primary closure.  The surgical procedure has been discussed with the patient.  Potential risks, benefits, alternative treatments, and expected outcomes have been explained.  All of the patient's questions at this time have been answered.  The likelihood of reaching the patient's treatment goal is good.  The patient understand the proposed surgical procedure and wishes to proceed.        Alexandra [name] K. 09/13/2012, 11:16 AM    

## 2012-09-16 ENCOUNTER — Encounter (HOSPITAL_COMMUNITY)
Admission: RE | Admit: 2012-09-16 | Discharge: 2012-09-16 | Disposition: A | Discharge status: Home / Self Care | Payer: 59 | Source: Ambulatory Visit | Attending: Surgery | Admitting: Surgery

## 2012-09-16 ENCOUNTER — Encounter (HOSPITAL_COMMUNITY): Payer: Self-pay

## 2012-09-16 LAB — BASIC METABOLIC PANEL
BUN: 10 mg/dL (ref 6–23)
CO2: 27 mEq/L (ref 19–32)
Calcium: 9.3 mg/dL (ref 8.4–10.5)
Chloride: 103 mEq/L (ref 96–112)
Creatinine, Ser: 0.65 mg/dL (ref 0.50–1.10)
GFR calc Af Amer: >90 mL/min (ref >90)
GFR calc non Af Amer: >90 mL/min (ref >90)
Glucose, Bld: 68 mg/dL — ABNORMAL LOW (ref 70–99)
Potassium: 4.5 mEq/L (ref 3.5–5.1)
Sodium: 139 mEq/L (ref 135–145)

## 2012-09-16 LAB — SURGICAL PCR SCREEN
MRSA, PCR: NEGATIVE
Staphylococcus aureus: NEGATIVE

## 2012-09-16 LAB — CBC
HCT: 42.5 % (ref 36.0–46.0)
Hemoglobin: 14.2 g/dL (ref 12.0–15.0)
MCH: 30 pg (ref 26.0–34.0)
MCHC: 33.4 g/dL (ref 30.0–36.0)
MCV: 89.9 fL (ref 78.0–100.0)
Platelets: 296 10*3/uL (ref 150–400)
RBC: 4.73 MIL/uL (ref 3.87–5.11)
RDW: 12.9 % (ref 11.5–15.5)
WBC: 8.5 10*3/uL (ref 4.0–10.5)

## 2012-09-16 MED ORDER — CEFAZOLIN SODIUM-DEXTROSE 2-3 GM-% IV SOLR
2.0000 g | INTRAVENOUS | Status: AC
  Administered 2012-09-17: 2 g via INTRAVENOUS
  Filled 2012-09-16: qty 50

## 2012-09-16 NOTE — Pre-Procedure Instructions (Signed)
[facility]  09/16/2012   Your procedure is scheduled on:  09/17/12  Report to [facility] at 530 AM.  Call this number if you have problems the morning of surgery: [phone]   Remember:   Do not eat food:After Midnight.     Take these medicines the morning of surgery with A SIP OF WATER: all inhalers   Do not wear jewelry, make-up or nail polish.  Do not wear lotions, powders, or perfumes. You may wear deodorant.  Do not shave 48 hours prior to surgery. Men may shave face and neck.  Do not bring valuables to the hospital.  Contacts, dentures or bridgework may not be worn into surgery.  Leave suitcase in the car. After surgery it may be brought to your room.  For patients admitted to the hospital, checkout time is 11:00 AM the day of discharge.   Patients discharged the day of surgery will not be allowed to drive home.  Name and phone number of your driver: family  Special Instructions: Shower using CHG 2 nights before surgery and the night before surgery.  If you shower the day of surgery use CHG.  Use special wash - you have one bottle of CHG for all showers.  You should use approximately 1/3 of the bottle for each shower.   Please read over the following fact sheets that you were given: Pain Booklet, Coughing and Deep Breathing, MRSA Information and Surgical Site Infection Prevention

## 2012-09-17 ENCOUNTER — Ambulatory Visit (HOSPITAL_COMMUNITY)
Admission: RE | Admit: 2012-09-17 | Discharge: 2012-09-17 | Disposition: A | Discharge status: Home / Self Care | Payer: 59 | Source: Ambulatory Visit | Attending: Surgery | Admitting: Surgery

## 2012-09-17 ENCOUNTER — Encounter (HOSPITAL_COMMUNITY): Payer: Self-pay | Admitting: Anesthesiology

## 2012-09-17 ENCOUNTER — Ambulatory Visit (HOSPITAL_COMMUNITY): Payer: 59 | Admitting: Anesthesiology

## 2012-09-17 ENCOUNTER — Telehealth (INDEPENDENT_AMBULATORY_CARE_PROVIDER_SITE_OTHER): Payer: Self-pay | Admitting: General Surgery

## 2012-09-17 ENCOUNTER — Encounter (HOSPITAL_COMMUNITY)
Admission: RE | Disposition: A | Discharge status: Home / Self Care | Payer: Self-pay | Source: Ambulatory Visit | Attending: Surgery

## 2012-09-17 ENCOUNTER — Encounter (HOSPITAL_COMMUNITY): Payer: Self-pay

## 2012-09-17 DIAGNOSIS — Z01812 Encounter for preprocedural laboratory examination: Secondary | ICD-10-CM | POA: Insufficient documentation

## 2012-09-17 DIAGNOSIS — Z800 Family history of malignant neoplasm of digestive organs: Secondary | ICD-10-CM | POA: Insufficient documentation

## 2012-09-17 DIAGNOSIS — Z87440 Personal history of urinary (tract) infections: Secondary | ICD-10-CM | POA: Insufficient documentation

## 2012-09-17 DIAGNOSIS — Z8249 Family history of ischemic heart disease and other diseases of the circulatory system: Secondary | ICD-10-CM | POA: Insufficient documentation

## 2012-09-17 DIAGNOSIS — C4370 Malignant melanoma of unspecified lower limb, including hip: Secondary | ICD-10-CM | POA: Insufficient documentation

## 2012-09-17 DIAGNOSIS — Z01818 Encounter for other preprocedural examination: Secondary | ICD-10-CM | POA: Insufficient documentation

## 2012-09-17 DIAGNOSIS — Z833 Family history of diabetes mellitus: Secondary | ICD-10-CM | POA: Insufficient documentation

## 2012-09-17 DIAGNOSIS — Z803 Family history of malignant neoplasm of breast: Secondary | ICD-10-CM | POA: Insufficient documentation

## 2012-09-17 DIAGNOSIS — Z9089 Acquired absence of other organs: Secondary | ICD-10-CM | POA: Insufficient documentation

## 2012-09-17 DIAGNOSIS — J45909 Unspecified asthma, uncomplicated: Secondary | ICD-10-CM | POA: Insufficient documentation

## 2012-09-17 HISTORY — PX: MELANOMA EXCISION: SHX5266

## 2012-09-17 SURGERY — EXCISION, MELANOMA
Anesthesia: Monitor Anesthesia Care | Site: Leg Lower | Laterality: Left | Wound class: Clean

## 2012-09-17 MED ORDER — MIDAZOLAM HCL 5 MG/5ML IJ SOLN
INTRAMUSCULAR | Status: DC | PRN
  Administered 2012-09-17: 2 mg via INTRAVENOUS

## 2012-09-17 MED ORDER — FENTANYL CITRATE 0.05 MG/ML IJ SOLN
INTRAMUSCULAR | Status: DC | PRN
  Administered 2012-09-17: 25 ug via INTRAVENOUS
  Administered 2012-09-17: 100 ug via INTRAVENOUS
  Administered 2012-09-17: 25 ug via INTRAVENOUS

## 2012-09-17 MED ORDER — PROPOFOL INFUSION 10 MG/ML OPTIME
INTRAVENOUS | Status: DC | PRN
  Administered 2012-09-17: 75 ug/kg/min via INTRAVENOUS

## 2012-09-17 MED ORDER — BUPIVACAINE-EPINEPHRINE 0.25% -1:200000 IJ SOLN
INTRAMUSCULAR | Status: DC | PRN
  Administered 2012-09-17: 24 mL

## 2012-09-17 MED ORDER — 0.9 % SODIUM CHLORIDE (POUR BTL) OPTIME
TOPICAL | Status: DC | PRN
  Administered 2012-09-17: 1000 mL

## 2012-09-17 MED ORDER — ONDANSETRON HCL 4 MG/2ML IJ SOLN: 4.0000 mg | INTRAMUSCULAR | Status: DC | PRN

## 2012-09-17 MED ORDER — MORPHINE SULFATE 2 MG/ML IJ SOLN: 2.0000 mg | INTRAMUSCULAR | Status: DC | PRN

## 2012-09-17 MED ORDER — ONDANSETRON HCL 4 MG/2ML IJ SOLN
INTRAMUSCULAR | Status: DC | PRN
  Administered 2012-09-17: 4 mg via INTRAVENOUS

## 2012-09-17 MED ORDER — PHENYLEPHRINE HCL 10 MG/ML IJ SOLN
INTRAMUSCULAR | Status: DC | PRN
  Administered 2012-09-17: 80 ug via INTRAVENOUS
  Administered 2012-09-17: 40 ug via INTRAVENOUS
  Administered 2012-09-17 (×6): 80 ug via INTRAVENOUS
  Administered 2012-09-17: 40 ug via INTRAVENOUS

## 2012-09-17 MED ORDER — LACTATED RINGERS IV SOLN
INTRAVENOUS | Status: DC | PRN
  Administered 2012-09-17: 07:00:00 via INTRAVENOUS

## 2012-09-17 MED ORDER — HYDROCODONE-ACETAMINOPHEN 5-325 MG PO TABS: 1.0000 | ORAL_TABLET | ORAL | Status: DC | PRN

## 2012-09-17 MED ORDER — BUPIVACAINE-EPINEPHRINE PF 0.25-1:200000 % IJ SOLN
INTRAMUSCULAR | Status: AC
  Filled 2012-09-17: qty 30

## 2012-09-17 SURGICAL SUPPLY — 51 items
APL SKNCLS STERI-STRIP NONHPOA (GAUZE/BANDAGES/DRESSINGS)
BANDAGE ELASTIC 4 VELCRO ST LF (GAUZE/BANDAGES/DRESSINGS) ×1 IMPLANT
BANDAGE GAUZE 4  KLING STR (GAUZE/BANDAGES/DRESSINGS) ×1 IMPLANT
BENZOIN TINCTURE PRP APPL 2/3 (GAUZE/BANDAGES/DRESSINGS) ×1 IMPLANT
BLADE SURG 10 STRL SS (BLADE) ×2 IMPLANT
BLADE SURG 15 STRL LF DISP TIS (BLADE) ×1 IMPLANT
BLADE SURG 15 STRL SS (BLADE) ×2
BLADE SURG ROTATE 9660 (MISCELLANEOUS) IMPLANT
CANISTER SUCTION 2500CC (MISCELLANEOUS) IMPLANT
CHLORAPREP W/TINT 26ML (MISCELLANEOUS) ×2 IMPLANT
CLOTH BEACON ORANGE TIMEOUT ST (SAFETY) ×2 IMPLANT
COVER SURGICAL LIGHT HANDLE (MISCELLANEOUS) ×2 IMPLANT
DECANTER SPIKE VIAL GLASS SM (MISCELLANEOUS) ×2 IMPLANT
DRAPE ORTHO SPLIT 77X108 STRL (DRAPES)
DRAPE PED LAPAROTOMY (DRAPES) ×1 IMPLANT
DRAPE SURG ORHT 6 SPLT 77X108 (DRAPES) IMPLANT
ELECT CAUTERY BLADE 6.4 (BLADE) ×2 IMPLANT
ELECT REM PT RETURN 9FT ADLT (ELECTROSURGICAL) ×2
ELECTRODE REM PT RTRN 9FT ADLT (ELECTROSURGICAL) ×1 IMPLANT
GAUZE XEROFORM 1X8 LF (GAUZE/BANDAGES/DRESSINGS) ×1 IMPLANT
GLOVE BIO SURGEON STRL SZ7 (GLOVE) ×2 IMPLANT
GLOVE BIOGEL PI IND STRL 7.5 (GLOVE) ×1 IMPLANT
GLOVE BIOGEL PI INDICATOR 7.5 (GLOVE) ×1
GOWN STRL NON-REIN LRG LVL3 (GOWN DISPOSABLE) ×2 IMPLANT
KIT BASIN OR (CUSTOM PROCEDURE TRAY) ×2 IMPLANT
KIT ROOM TURNOVER OR (KITS) ×2 IMPLANT
NDL HYPO 25GX1X1/2 BEV (NEEDLE) ×1 IMPLANT
NEEDLE HYPO 25GX1X1/2 BEV (NEEDLE) ×2 IMPLANT
NS IRRIG 1000ML POUR BTL (IV SOLUTION) ×2 IMPLANT
PACK SURGICAL SETUP 50X90 (CUSTOM PROCEDURE TRAY) ×2 IMPLANT
PAD ARMBOARD 7.5X6 YLW CONV (MISCELLANEOUS) ×2 IMPLANT
PENCIL BUTTON HOLSTER BLD 10FT (ELECTRODE) ×2 IMPLANT
SPONGE GAUZE 4X4 12PLY (GAUZE/BANDAGES/DRESSINGS) ×2 IMPLANT
SPONGE LAP 18X18 X RAY DECT (DISPOSABLE) ×2 IMPLANT
STRIP CLOSURE SKIN 1/2X4 (GAUZE/BANDAGES/DRESSINGS) IMPLANT
SUT ETHILON 2 0 FS 18 (SUTURE) IMPLANT
SUT ETHILON 3 0 FSL (SUTURE) IMPLANT
SUT ETHILON 3 0 PS 1 (SUTURE) ×2 IMPLANT
SUT MNCRL AB 4-0 PS2 18 (SUTURE) ×1 IMPLANT
SUT VIC AB 2-0 SH 27 (SUTURE)
SUT VIC AB 2-0 SH 27X BRD (SUTURE) IMPLANT
SUT VIC AB 3-0 SH 27 (SUTURE)
SUT VIC AB 3-0 SH 27XBRD (SUTURE) IMPLANT
SUT VIC AB 3-0 SH 8-18 (SUTURE) ×1 IMPLANT
SYR BULB 3OZ (MISCELLANEOUS) ×2 IMPLANT
SYR CONTROL 10ML LL (SYRINGE) ×2 IMPLANT
TOWEL OR 17X24 6PK STRL BLUE (TOWEL DISPOSABLE) ×2 IMPLANT
TOWEL OR 17X26 10 PK STRL BLUE (TOWEL DISPOSABLE) ×2 IMPLANT
TUBE CONNECTING 12X1/4 (SUCTIONS) IMPLANT
UNDERPAD 30X30 INCONTINENT (UNDERPADS AND DIAPERS) IMPLANT
YANKAUER SUCT BULB TIP NO VENT (SUCTIONS) IMPLANT

## 2012-09-17 NOTE — Interval H&P Note (Signed)
History and Physical Interval Note:  09/17/2012 7:18 AM  Alexandra [name]  has presented today for surgery, with the diagnosis of wide excision of melanoma-posterior left leg  The various methods of treatment have been discussed with the patient and family. After consideration of risks, benefits and other options for treatment, the patient has consented to  Procedure(s) (LRB) with comments: MELANOMA EXCISION (Left) - wide excision of meanoma-posterior left leg as a surgical intervention .  The patient's history has been reviewed, patient examined, no change in status, stable for surgery.  I have reviewed the patient's chart and labs.  Questions were answered to the patient's satisfaction.     [name]

## 2012-09-17 NOTE — H&P (View-Only) (Signed)
Patient ID: Alexandra [name], female   DOB: September 09, 1976, 36 y.o.   MRN: [id number]  Chief Complaint  Patient presents with  . Establish Care     Lft Calf  mel    HPI Alexandra [name] is a 36 y.o. female.  Referred by Tollie Eth, PA at [facility] for evaluation of left leg melanoma HPI This is a healthy 36 year old female status post lap scalpel cholecystectomy in 2011. The patient is doing quite well from abdominal standpoint. She has had a dark mole on the back of her left leg for many years. However this seemed to change and her husband observed these changes. She was referred to the dermatologist for evaluation. She underwent a shave removal of the mole. Pathology showed superficial spreading malignant melanoma with a Clark's level II and rose low depth of 0.49 mm. No ulceration or satellite nodules were noted. No vascular invasion identified. Based on their note the lesion measured 10 x 9 mm prior to removal. She comes in today to discuss wide excision. He has no other suspicious skin lesions.  Past Medical History  Diagnosis Date  . Gallbladder attack   . Gallstone   . Asthma   . Abnormal Pap smear     cryo 1999  . Family history of malignant neoplasm of gastrointestinal tract   . Hx: UTI (urinary tract infection)   . Hemorrhoids   . Melanoma of lower limb     Past Surgical History  Procedure Date  . Cholecystectomy   . Cryotherapy     Family History  Problem Relation Age of Onset  . Hypertension Father   . Breast cancer Maternal Grandmother   . Heart disease Maternal Grandfather   . Colon cancer      Paternal [employer]   . Diabetes Paternal Grandfather     Social History History  Substance Use Topics  . Smoking status: Never Smoker   . Smokeless tobacco: Never Used  . Alcohol Use: No    Allergies  Allergen Reactions  . Sulfamethoxazole W-Trimethoprim     REACTION: hives    Meds:  None  Review of System Review of Systems  Constitutional:  Negative for fever, chills and unexpected weight change.  HENT: Negative for hearing loss, congestion, sore throat, trouble swallowing and voice change.   Eyes: Negative for visual disturbance.  Respiratory: Negative for cough and wheezing.   Cardiovascular: Negative for chest pain, palpitations and leg swelling.  Gastrointestinal: Negative for nausea, vomiting, abdominal pain, diarrhea, constipation, blood in stool, abdominal distention and anal bleeding.  Genitourinary: Negative for hematuria, vaginal bleeding and difficulty urinating.  Musculoskeletal: Negative for arthralgias.  Skin: Positive for wound. Negative for rash.  Neurological: Negative for seizures, syncope and headaches.  Hematological: Negative for adenopathy. Does not bruise/bleed easily.  Psychiatric/Behavioral: Negative for confusion.    Blood pressure 128/62, pulse 91, temperature 97.6 F (36.4 C), temperature source Temporal, resp. rate 18, height 5\' 5"  (1.651 m), weight 125 lb 6.4 oz (56.881 kg).  Physical Exam Physical Exam WDWN in NAD No inguinal lymphadenopathy Left posterior calf - 1 cm round wound - healing with no sign of infection; no satellite lesions noted.  Data Reviewed Path report   Assessment    Melanoma - left posterior leg - .49 mm depth    Plan    In the absence of any lymphadenopathy or other worrisome findings, wide excision should be adequate treatment.  We will plan excision with at least 1 cm margins and  primary closure.  The surgical procedure has been discussed with the patient.  Potential risks, benefits, alternative treatments, and expected outcomes have been explained.  All of the patient's questions at this time have been answered.  The likelihood of reaching the patient's treatment goal is good.  The patient understand the proposed surgical procedure and wishes to proceed.        Mattison Golay K. 09/13/2012, 11:16 AM

## 2012-09-17 NOTE — Telephone Encounter (Signed)
Pt's husband called at 4:30 pm to report pt has developed a migraine headache.  She is not having pain from the surgery (Vicodin used) but has severe headache behind eye, at temples and forehead, with light and sound sensitivity.  Denies dizziness, blurred vision or LOC.  Husband informed to treat with NSAIDs and continue Vicodin as needed.  Plan to call in the morning to check on her.

## 2012-09-17 NOTE — Anesthesia Preprocedure Evaluation (Addendum)
Anesthesia Evaluation  Patient identified by MRN, date of birth, ID band Patient awake    Reviewed: Allergy & Precautions, H&P , NPO status , Patient's Chart, lab work & pertinent test results  Airway Mallampati: II TM Distance: >3 FB Neck ROM: Full    Dental  (+) Teeth Intact   Pulmonary asthma ,    Pulmonary exam normal       Cardiovascular     Neuro/Psych PSYCHIATRIC DISORDERS    GI/Hepatic   Endo/Other    Renal/GU      Musculoskeletal   Abdominal Normal abdominal exam  (+)   Peds  Hematology   Anesthesia Other Findings   Reproductive/Obstetrics                          Anesthesia Physical Anesthesia Plan  ASA: II  Anesthesia Plan: General   Post-op Pain Management:    Induction: Intravenous  Airway Management Planned: Oral ETT  Additional Equipment:   Intra-op Plan:   Post-operative Plan: Extubation in OR  Informed Consent: I have reviewed the patients History and Physical, chart, labs and discussed the procedure including the risks, benefits and alternatives for the proposed anesthesia with the patient or authorized representative who has indicated his/her understanding and acceptance.   Dental advisory given  Plan Discussed with: CRNA, Anesthesiologist and Surgeon  Anesthesia Plan Comments:         Anesthesia Quick Evaluation

## 2012-09-17 NOTE — Op Note (Signed)
Pre-op Diagnosis:  Malignant melanoma posterior left leg (0.49 mm deep)  Postop diagnosis: Same Procedure performed: Wide excision of malignant melanoma posterior left leg Surgeon:  Keiasia Christianson K. Anesthesia:  Local MAC Indications: This is a 35 year old female in good health who presented with a suspicious mole on her posterior left leg. This was biopsied by dermatology and returned a diagnosis of superficial spreading malignant melanoma with a Breslow depth of 0.49 mm. We examined her in the office determine that therapy could be limited to a wide excision. She presents now for excision.  Description of procedure: The patient was brought to the operating room and placed in a lateral position on her left side. Her leg was propped up on pillows. After an adequate level of intravenous sedation was given in her left leg was prepped with chlor prep and draped in sterile fashion. A timeout was taken to ensure the proper patient proper procedure. I outlined a long elliptical incision taking a minimum of 1 cm margins around the biopsy site. We infiltrated this entire area with a total of 24 cc of 0.25% Marcaine. We made our incision and excise a full-thickness skin specimen down into the subcutaneous fat. This was oriented in a longitudinal fashion with a suture at the proximal end. This was sent for pathologic examination. Cautery was then used for hemostasis. We raised skin flaps by dissecting the skin and subcutaneous tissue off of the underlying gastrocnemius muscle. Multiple interrupted 3-0 Vicryl sutures were used to pull the subcutaneous tissues and dermis together. The skin was then approximated with multiple horizontal mattress sutures of 3-0 nylon. This pulled the skin edges together easily. The wound was dressed with Xeroform gauze 4 x 4 Kling wrap and an Ace wrap. The patient was then awakened and brought to the recovery room in stable condition. All sponge, initially, and needle counts are  correct.  Alexandra [name]. Alexandra Skains, MD, [facility]  09/17/2012 8:28 AM

## 2012-09-17 NOTE — Transfer of Care (Signed)
Immediate Anesthesia Transfer of Care Note  Patient: Alexandra [name]  Procedure(s) Performed: Procedure(s) (LRB) with comments: MELANOMA EXCISION (Left) - wide excision of meanoma-posterior left leg  Patient Location: PACU  Anesthesia Type:MAC  Level of Consciousness: awake, alert  and oriented  Airway & Oxygen Therapy: Patient Spontanous Breathing  Post-op Assessment: Report given to PACU RN  Post vital signs: Reviewed and stable  Complications: No apparent anesthesia complications

## 2012-09-17 NOTE — Preoperative (Signed)
Beta Blockers   Reason not to administer Beta Blockers:Not Applicable 

## 2012-09-17 NOTE — Anesthesia Postprocedure Evaluation (Signed)
  Anesthesia Post-op Note  Patient: Alexandra [name]  Procedure(s) Performed: Procedure(s) (LRB) with comments: MELANOMA EXCISION (Left) - wide excision of meanoma-posterior left leg  Patient Location: PACU  Anesthesia Type:MAC  Level of Consciousness: awake  Airway and Oxygen Therapy: Patient Spontanous Breathing  Post-op Pain: mild  Post-op Assessment: Post-op Vital signs reviewed  Post-op Vital Signs: stable  Complications: No apparent anesthesia complications

## 2012-09-18 ENCOUNTER — Encounter (HOSPITAL_COMMUNITY): Payer: Self-pay | Admitting: Surgery

## 2012-09-26 ENCOUNTER — Encounter (INDEPENDENT_AMBULATORY_CARE_PROVIDER_SITE_OTHER): Payer: Self-pay | Admitting: Surgery

## 2012-09-26 ENCOUNTER — Ambulatory Visit (INDEPENDENT_AMBULATORY_CARE_PROVIDER_SITE_OTHER): Payer: 59 | Admitting: Surgery

## 2012-09-26 VITALS — BP 110/77 | HR 80 | Temp 98.0°F | Resp 14 | Ht 65.0 in | Wt 127.6 lb

## 2012-09-26 DIAGNOSIS — C4370 Malignant melanoma of unspecified lower limb, including hip: Secondary | ICD-10-CM

## 2012-09-26 NOTE — Progress Notes (Signed)
Status post wide excision of malignant melanoma from the posterior left leg on 09/17/12. The margins were negative. The specimen showed no sign of residual melanoma. The patient's incision is healing well. It gets tight later in the day after she's been on her feet for some time. It helps to wear an Ace wrap over this area.The patient's skin is very sensitive to Steri-Strips based on her experience from her gallbladder surgery. We will leave the sutures in place for a few more days. She will come in next week to have these removed. Otherwise she is doing quite well.  Follow-up PRN after suture removal.  Wilmon Arms. Corliss Skains, MD, [facility]  09/26/2012 9:40 AM

## 2012-09-30 ENCOUNTER — Telehealth (INDEPENDENT_AMBULATORY_CARE_PROVIDER_SITE_OTHER): Payer: Self-pay | Admitting: General Surgery

## 2012-10-01 ENCOUNTER — Ambulatory Visit (INDEPENDENT_AMBULATORY_CARE_PROVIDER_SITE_OTHER): Payer: 59 | Admitting: General Surgery

## 2012-10-01 ENCOUNTER — Encounter (INDEPENDENT_AMBULATORY_CARE_PROVIDER_SITE_OTHER): Payer: Self-pay | Admitting: General Surgery

## 2012-10-01 ENCOUNTER — Encounter (INDEPENDENT_AMBULATORY_CARE_PROVIDER_SITE_OTHER): Payer: 59 | Admitting: General Surgery

## 2012-10-01 VITALS — BP 110/66 | HR 70 | Temp 97.8°F | Resp 16 | Ht 65.0 in | Wt 125.4 lb

## 2012-10-01 DIAGNOSIS — C4370 Malignant melanoma of unspecified lower limb, including hip: Secondary | ICD-10-CM

## 2012-10-01 NOTE — Telephone Encounter (Signed)
Accident encounter...just making patient a follow up appt

## 2012-10-01 NOTE — Progress Notes (Signed)
Patient ID: Alexandra [name], female   DOB: 1976/06/18, 36 y.o.   MRN: [id number] Pt has been doing well.  No complaints. Pt to have sutures removed today.  Pt keeps wound ACE wrapped to min edema.  O/E:  Wound c/d/i, no erythema, wound has come together well.  A/P:  We will DC sutures. PT to return tosee Dr. Corliss Skains in 1-2 wks.

## 2012-10-01 NOTE — Telephone Encounter (Signed)
After seeing dr.ramirez in clinic on 10/01/12

## 2012-10-03 ENCOUNTER — Encounter (INDEPENDENT_AMBULATORY_CARE_PROVIDER_SITE_OTHER): Payer: 59 | Admitting: General Surgery

## 2012-10-04 ENCOUNTER — Telehealth (INDEPENDENT_AMBULATORY_CARE_PROVIDER_SITE_OTHER): Payer: Self-pay

## 2012-10-04 NOTE — Telephone Encounter (Signed)
Patient called in stating incision opened up just a little in one spot. She called the on call dr last night and was told to just cover it with clean dry guaze and follow up with Tsuei on her scheduled post op appt. She questioned if she should be seen sooner. No redness, drainage, fever or signs of infection. I told her I didn't think it was an urgent thing for her to be seen. I told her to do as the on call doctor told her, to keep clean dry guaze  On area and to use ACE wrap around leg. I told her to call us if any signs of infection appear like redness or fever or any drainage. I also told her to call if the incision opens up wider  and we would have someone look at it. I let Dr. Derrell Lolling know since he was the doctor that took her sutures out and he said she can't have steri strips applied due to allergic reactions so she needs to just watch it for now. Patient understood.

## 2012-10-21 ENCOUNTER — Encounter (INDEPENDENT_AMBULATORY_CARE_PROVIDER_SITE_OTHER): Payer: 59 | Admitting: Surgery

## 2012-10-21 ENCOUNTER — Encounter (INDEPENDENT_AMBULATORY_CARE_PROVIDER_SITE_OTHER): Payer: Self-pay | Admitting: Surgery

## 2012-10-21 ENCOUNTER — Ambulatory Visit (INDEPENDENT_AMBULATORY_CARE_PROVIDER_SITE_OTHER): Payer: 59 | Admitting: Surgery

## 2012-10-21 VITALS — BP 119/68 | HR 70 | Temp 98.6°F | Resp 14 | Ht 65.0 in | Wt 126.0 lb

## 2012-10-21 DIAGNOSIS — C4370 Malignant melanoma of unspecified lower limb, including hip: Secondary | ICD-10-CM

## 2012-10-21 NOTE — Progress Notes (Signed)
The patient comes in for a last wound check. The center part of her wound does gap slightly but it is clean and well granulated. It is almost completely healed. No surrounding erythema. Minimal drainage. She is treating this area with Neosporin ointment. The wound should heal fine. She should use sunscreen over this area. She is following up regularly with a dermatologist. Follow up with [place] when necessary.  Alexandra [name]. Corliss Skains, MD, [facility]  10/21/2012 1:51 PM

## 2012-10-23 LAB — HM PAP SMEAR: HM PAP: NORMAL

## 2012-11-23 ENCOUNTER — Other Ambulatory Visit: Payer: Self-pay

## 2012-12-30 ENCOUNTER — Ambulatory Visit (INDEPENDENT_AMBULATORY_CARE_PROVIDER_SITE_OTHER): Payer: 59 | Admitting: Family Medicine

## 2012-12-30 ENCOUNTER — Telehealth: Payer: Self-pay | Admitting: Family Medicine

## 2012-12-30 ENCOUNTER — Encounter: Payer: Self-pay | Admitting: Family Medicine

## 2012-12-30 VITALS — BP 110/70 | HR 88 | Temp 98.3°F | Ht 64.5 in | Wt 126.8 lb

## 2012-12-30 DIAGNOSIS — R0789 Other chest pain: Secondary | ICD-10-CM | POA: Insufficient documentation

## 2012-12-30 MED ORDER — GI COCKTAIL ~~LOC~~
30.0000 mL | Freq: Once | ORAL | Status: AC
  Administered 2012-12-30: 30 mL via ORAL

## 2012-12-30 NOTE — Telephone Encounter (Signed)
Patient Information:  Caller Name: Romana Juniper  Phone: [phone]  Patient: Alexandra [name], Alexandra [name]  Gender: Female  DOB: 10-31-1975  Age: 37 Years  PCP: Sheliah Hatch  Pregnant: No  Office Follow Up:  Does the office need to follow up with this patient?: No  Instructions For The Office: N/A  RN Note:  Has history of asthma but says this tightness is higher; has started gardening and not sure if muscles related.  Patient refused 911 stating that this has been a constant thing for past week and she has been able to function.  Care advice given with appointment scheduled for Dr. Beverely Low at 14:45-3/24/14. Caller demonstrated her understanding. (Unable to open chart initially-reviewed clinical profile per patient)  Symptoms  Reason For Call & Symptoms: Tightness in chest with SOB; Allergies have started;  Constant tightness and it does not come and go--Rates discomfort as mild. Is only Sob if she moves around.  Septra/Adhesives; Call came as emergent  Reviewed Health History In EMR: Yes  Reviewed Medications In EMR: Yes  Reviewed Allergies In EMR: Yes  Reviewed Surgeries / Procedures: Yes  Date of Onset of Symptoms: 12/24/2012 OB / GYN:  LMP: 12/02/2012  Guideline(s) Used:  Chest Pain  Disposition Per Guideline:   See Today in Office  Reason For Disposition Reached:   All other patients with chest pain  Advice Given:  Call Back If:  Severe chest pain  Constant chest pain lasting longer than 5 minutes  Difficulty breathing  Fever  You become worse.  Patient Will Follow Care Advice:  YES  Appointment Scheduled:  12/30/2012 14:45:00 Appointment Scheduled Provider:  Sheliah Hatch.

## 2012-12-30 NOTE — Progress Notes (Signed)
  Subjective:    Patient ID: Alexandra [name], female    DOB: 1975-11-30, 37 y.o.   MRN: [id number]  HPI Chest tightness- pt started gardening last week and initially thought it was a pulled muscle but pain has not improved.  Associated SOB- today has been constant.  + fatigue.  No cough.  Ibuprofen w/out relief.  Pt reports dull ache, 2/10, in center of chest.  Pt reports feels similar to GERD during pregnancy.  Has not taken acid reducer.  Chest is not TTP.   Review of Systems For ROS see HPI     Objective:   Physical Exam  Vitals reviewed. Constitutional: She appears well-developed and well-nourished. No distress.  Neck: Normal range of motion. Neck supple. No thyromegaly present.  Cardiovascular: Normal rate, regular rhythm, normal heart sounds and intact distal pulses.   Pulmonary/Chest: Effort normal and breath sounds normal. No respiratory distress. She has no wheezes. She has no rales. She exhibits tenderness (over R sternocostal jxn).  Abdominal: Soft. Bowel sounds are normal. She exhibits no distension. There is no tenderness. There is no rebound and no guarding.  Lymphadenopathy:    She has no cervical adenopathy.  Skin: Skin is warm and dry.  Psychiatric: She has a normal mood and affect. Her behavior is normal.          Assessment & Plan:

## 2012-12-30 NOTE — Patient Instructions (Addendum)
The chest pain is most likely a combo of reflux and muscular chest pain Restart the acid reducer Tylenol for chest wall pain Heating pad as needed Call if your symptoms change or worsen Hang in there!!!

## 2012-12-30 NOTE — Telephone Encounter (Signed)
Appointment Scheduled:  12/30/2012 14:45:00  Appointment Scheduled Provider:  Sheliah Hatch.

## 2013-01-03 NOTE — Assessment & Plan Note (Signed)
New.  Suspect this is combo of musculoskeletal pain and GERD/esophageal spasm.  Some sxs improvement w/ GI cocktail given in office.  ekg w/out obvious cause of CP.  Encouraged pt to take tylenol for musculoskeletal pain and acid reducer for GERD.  Reviewed supportive care and red flags that should prompt return.  Pt expressed understanding and is in agreement w/ plan.

## 2013-08-14 ENCOUNTER — Other Ambulatory Visit: Payer: Self-pay

## 2013-10-23 ENCOUNTER — Ambulatory Visit (INDEPENDENT_AMBULATORY_CARE_PROVIDER_SITE_OTHER): Payer: 59 | Admitting: Family Medicine

## 2013-10-23 ENCOUNTER — Encounter: Payer: Self-pay | Admitting: Family Medicine

## 2013-10-23 VITALS — BP 130/80 | HR 86 | Temp 98.2°F | Resp 16 | Wt 130.2 lb

## 2013-10-23 DIAGNOSIS — F341 Dysthymic disorder: Secondary | ICD-10-CM

## 2013-10-23 DIAGNOSIS — F32A Depression, unspecified: Secondary | ICD-10-CM | POA: Insufficient documentation

## 2013-10-23 DIAGNOSIS — F329 Major depressive disorder, single episode, unspecified: Secondary | ICD-10-CM | POA: Insufficient documentation

## 2013-10-23 DIAGNOSIS — F419 Anxiety disorder, unspecified: Principal | ICD-10-CM

## 2013-10-23 MED ORDER — ALPRAZOLAM 0.25 MG PO TABS: 0.2500 mg | ORAL_TABLET | Freq: Two times a day (BID) | ORAL | Status: DC | PRN

## 2013-10-23 MED ORDER — FLUOXETINE HCL 20 MG PO TABS: 20.0000 mg | ORAL_TABLET | Freq: Every day | ORAL | Status: DC

## 2013-10-23 NOTE — Patient Instructions (Signed)
Follow up in 3-4 weeks to recheck mood Start the Prozac daily Use the xanax in those panicked moments or for sleep Please consider counseling- you deserve it! Call with any questions or concerns- anything! Hang in there!!!

## 2013-10-23 NOTE — Progress Notes (Signed)
Pre visit review using our clinic review tool, if applicable. No additional management support is needed unless otherwise documented below in the visit note. 

## 2013-10-24 NOTE — Assessment & Plan Note (Signed)
New.  Pt's sxs moderate to severe.  No SI/HI.  Start low dose SSRI for daily sxs control and low dose xanax prn for anxiety or sleep.  Stressed the importance of counseling as pt tries to navigate the road ahead.  Will follow closely.

## 2013-10-24 NOTE — Progress Notes (Signed)
   Subjective:    Patient ID: AMIREE NO, female    DOB: 1976-10-08, 38 y.o.   MRN: [id number]  HPI Anxiety/depression- husband's transplanted heart w/ EF down to 25%.  Recently hospitalized for PNA.  Unable to be re-listed on transplant list until 'he's sicker'.  Pt now fears her life is going to be revolving hospital admission.  2 kids- 4 and 6 depending on her.  Money is tight b/c of hospital bills.  Strained family relations and little emotional, physical, or financial help.  Pt w/ hx of depression- 'i know i have to be proactive before i get to the point i can't do it anymore'.   Review of Systems For ROS see HPI     Objective:   Physical Exam  Vitals reviewed. Constitutional: She is oriented to person, place, and time. She appears well-developed and well-nourished. She appears distressed (tearful, anxious, obviously upset).  Neurological: She is alert and oriented to person, place, and time.  Skin: Skin is warm and dry.  Psychiatric: Her behavior is normal. Judgment and thought content normal.  Tearful, anxious          Assessment & Plan:

## 2013-10-27 ENCOUNTER — Telehealth: Payer: Self-pay | Admitting: *Deleted

## 2013-10-27 NOTE — Telephone Encounter (Signed)
Patient called and stated that she has stopped the Prozac because she believes it was causing her to have SOB. Patient states that she had to use her inhaler more than usual this weekend.

## 2013-10-27 NOTE — Telephone Encounter (Signed)
This is an unusual side effect but I won't rule it out.  Could it be the anxiety is causing shortness of breath?  Or a respiratory virus since she's been spending so much time in the hospital?  We can switch to Celexa 20mg  and see if this improves.

## 2013-10-28 MED ORDER — CITALOPRAM HYDROBROMIDE 20 MG PO TABS: 20.0000 mg | ORAL_TABLET | Freq: Every day | ORAL | Status: DC

## 2013-10-28 NOTE — Telephone Encounter (Signed)
Called and lmovm notifying pt.

## 2013-10-28 NOTE — Telephone Encounter (Signed)
Changed medication, will notify pt.

## 2013-11-20 ENCOUNTER — Other Ambulatory Visit: Payer: Self-pay | Admitting: Physician Assistant

## 2013-12-08 ENCOUNTER — Encounter: Payer: Self-pay | Admitting: Family Medicine

## 2013-12-08 ENCOUNTER — Ambulatory Visit (INDEPENDENT_AMBULATORY_CARE_PROVIDER_SITE_OTHER): Payer: 59 | Admitting: Family Medicine

## 2013-12-08 ENCOUNTER — Encounter: Payer: Self-pay | Admitting: Lab

## 2013-12-08 VITALS — BP 110/80 | HR 88 | Temp 98.0°F | Resp 16 | Ht 65.0 in | Wt 124.5 lb

## 2013-12-08 DIAGNOSIS — R1011 Right upper quadrant pain: Secondary | ICD-10-CM

## 2013-12-08 DIAGNOSIS — Z0000 Encounter for general adult medical examination without abnormal findings: Secondary | ICD-10-CM

## 2013-12-08 DIAGNOSIS — Z23 Encounter for immunization: Secondary | ICD-10-CM

## 2013-12-08 LAB — TSH: TSH: 1.06 u[IU]/mL (ref 0.35–5.50)

## 2013-12-08 LAB — HEPATIC FUNCTION PANEL
ALBUMIN: 4.2 g/dL (ref 3.5–5.2)
ALK PHOS: 53 U/L (ref 39–117)
ALT: 20 U/L (ref 0–35)
AST: 15 U/L (ref 0–37)
Bilirubin, Direct: 0.1 mg/dL (ref 0.0–0.3)
TOTAL PROTEIN: 7.1 g/dL (ref 6.0–8.3)
Total Bilirubin: 0.7 mg/dL (ref 0.3–1.2)

## 2013-12-08 LAB — CBC WITH DIFFERENTIAL/PLATELET
BASOS ABS: 0 10*3/uL (ref 0.0–0.1)
Basophils Relative: 0.7 % (ref 0.0–3.0)
Eosinophils Absolute: 0.2 10*3/uL (ref 0.0–0.7)
Eosinophils Relative: 3.7 % (ref 0.0–5.0)
HCT: 39.4 % (ref 36.0–46.0)
Hemoglobin: 13.1 g/dL (ref 12.0–15.0)
LYMPHS PCT: 43.1 % (ref 12.0–46.0)
Lymphs Abs: 2.3 10*3/uL (ref 0.7–4.0)
MCHC: 33.3 g/dL (ref 30.0–36.0)
MCV: 90.6 fl (ref 78.0–100.0)
MONOS PCT: 11.1 % (ref 3.0–12.0)
Monocytes Absolute: 0.6 10*3/uL (ref 0.1–1.0)
NEUTROS PCT: 41.4 % — AB (ref 43.0–77.0)
Neutro Abs: 2.2 10*3/uL (ref 1.4–7.7)
PLATELETS: 259 10*3/uL (ref 150.0–400.0)
RBC: 4.35 Mil/uL (ref 3.87–5.11)
RDW: 13.2 % (ref 11.5–14.6)
WBC: 5.4 10*3/uL (ref 4.5–10.5)

## 2013-12-08 LAB — BASIC METABOLIC PANEL
BUN: 12 mg/dL (ref 6–23)
CHLORIDE: 105 meq/L (ref 96–112)
CO2: 26 mEq/L (ref 19–32)
Calcium: 8.6 mg/dL (ref 8.4–10.5)
Creatinine, Ser: 0.6 mg/dL (ref 0.4–1.2)
GFR: 118.39 mL/min (ref >60.00)
GLUCOSE: 81 mg/dL (ref 70–99)
POTASSIUM: 4.1 meq/L (ref 3.5–5.1)
Sodium: 136 mEq/L (ref 135–145)

## 2013-12-08 LAB — LIPID PANEL
CHOLESTEROL: 178 mg/dL (ref 0–200)
HDL: 50 mg/dL (ref >39.00)
LDL Cholesterol: 117 mg/dL — ABNORMAL HIGH (ref 0–99)
Total CHOL/HDL Ratio: 4
Triglycerides: 53 mg/dL (ref 0.0–149.0)
VLDL: 10.6 mg/dL (ref 0.0–40.0)

## 2013-12-08 NOTE — Patient Instructions (Signed)
Follow up in 1 year or as needed Keep up the good work!  You look great! We'll notify you of your lab results and [place] appt- if no obvious cause for pain, will get you back to Dr Gershon Crane Call with any questions or concerns Hang in there!!!

## 2013-12-08 NOTE — Progress Notes (Signed)
   Subjective:    Patient ID: Alexandra [name], female    DOB: 1976/05/04, 38 y.o.   MRN: [id number]  HPI CPE- UTD on pap (GYN).  No concerns today.   Review of Systems Patient reports no vision/ hearing changes, adenopathy,fever, weight change,  persistant/recurrent hoarseness , swallowing issues, chest pain, palpitations, edema, persistant/recurrent cough, hemoptysis, dyspnea (rest/exertional/paroxysmal nocturnal), gastrointestinal bleeding (melena, rectal bleeding), significant heartburn, bowel changes, GU symptoms (dysuria, hematuria, incontinence), Gyn symptoms (abnormal  bleeding, pain),  syncope, focal weakness, memory loss, numbness & tingling, skin/hair/nail changes, abnormal bruising or bleeding, anxiety, or depression.  + RUQ pain- s/p gallbladder removal.  Pain started ~1 yr ago w/ dull ache and is now painful and uncomfortable to touch.  No N/V/D.  'if gallbladder attack is a 10, this is a 6-7'.  Pain is intermittent.  Occuring after eating, physical exertion (vacuuming)     Objective:   Physical Exam General Appearance:    Alert, cooperative, no distress, appears stated age  Head:    Normocephalic, without obvious abnormality, atraumatic  Eyes:    PERRL, conjunctiva/corneas clear, EOM's intact, fundi    benign, both eyes  Ears:    Normal TM's and external ear canals, both ears  Nose:   Nares normal, septum midline, mucosa normal, no drainage    or sinus tenderness  Throat:   Lips, mucosa, and tongue normal; teeth and gums normal  Neck:   Supple, symmetrical, trachea midline, no adenopathy;    Thyroid: no enlargement/tenderness/nodules  Back:     Symmetric, no curvature, ROM normal, no CVA tenderness  Lungs:     Clear to auscultation bilaterally, respirations unlabored  Chest Wall:    No tenderness or deformity   Heart:    Regular rate and rhythm, S1 and S2 normal, no murmur, rub   or gallop  Breast Exam:    Deferred to GYN  Abdomen:     Soft, + BS, TTP in RUQ around  previous incision site  Genitalia:    Deferred to GYN  Rectal:    Extremities:   Extremities normal, atraumatic, no cyanosis or edema  Pulses:   2+ and symmetric all extremities  Skin:   Skin color, texture, turgor normal, no rashes or lesions  Lymph nodes:   Cervical, supraclavicular, and axillary nodes normal  Neurologic:   CNII-XII intact, normal strength, sensation and reflexes    throughout          Assessment & Plan:

## 2013-12-08 NOTE — Assessment & Plan Note (Signed)
Pt's PE WNL w/ exception of RUQ pain (see above).  UTD on pap.  Check labs.  Anticipatory guidance provided.

## 2013-12-08 NOTE — Assessment & Plan Note (Signed)
New.  Pt w/ hx of cholecystectomy.  May have small incisional hernia.  Check labs.  Get [place] to assess.  Reviewed supportive care and red flags that should prompt return.  Pt expressed understanding and is in agreement w/ plan.

## 2013-12-08 NOTE — Progress Notes (Signed)
Pre visit review using our clinic review tool, if applicable. No additional management support is needed unless otherwise documented below in the visit note. 

## 2013-12-11 ENCOUNTER — Ambulatory Visit
Admission: RE | Admit: 2013-12-11 | Discharge: 2013-12-11 | Disposition: A | Discharge status: Home / Self Care | Payer: 59 | Source: Ambulatory Visit | Attending: Family Medicine | Admitting: Family Medicine

## 2013-12-11 DIAGNOSIS — R1011 Right upper quadrant pain: Secondary | ICD-10-CM

## 2013-12-13 LAB — VITAMIN D 1,25 DIHYDROXY
VITAMIN D 1, 25 (OH) TOTAL: 59 pg/mL (ref 18–72)
Vitamin D3 1, 25 (OH)2: 59 pg/mL

## 2014-01-01 ENCOUNTER — Other Ambulatory Visit: Payer: Self-pay | Admitting: Family Medicine

## 2014-01-01 MED ORDER — ALPRAZOLAM 0.25 MG PO TABS: 0.5000 mg | ORAL_TABLET | Freq: Every day | ORAL | Status: DC | PRN

## 2014-01-01 NOTE — Telephone Encounter (Signed)
Med filled.  

## 2014-02-01 ENCOUNTER — Encounter: Payer: Self-pay | Admitting: Family Medicine

## 2014-04-20 ENCOUNTER — Encounter: Payer: Self-pay | Admitting: Family Medicine

## 2014-05-08 ENCOUNTER — Ambulatory Visit (INDEPENDENT_AMBULATORY_CARE_PROVIDER_SITE_OTHER): Payer: 59 | Admitting: Emergency Medicine

## 2014-05-08 ENCOUNTER — Telehealth: Payer: Self-pay | Admitting: Family Medicine

## 2014-05-08 VITALS — BP 110/64 | HR 89 | Temp 98.3°F | Resp 18 | Ht 65.0 in | Wt 132.0 lb

## 2014-05-08 DIAGNOSIS — R079 Chest pain, unspecified: Secondary | ICD-10-CM

## 2014-05-08 DIAGNOSIS — K219 Gastro-esophageal reflux disease without esophagitis: Secondary | ICD-10-CM

## 2014-05-08 LAB — POCT CBC
Granulocyte percent: 51.9 %G (ref 37–80)
HCT, POC: 40.8 % (ref 37.7–47.9)
HEMOGLOBIN: 13.3 g/dL (ref 12.2–16.2)
Lymph, poc: 3.5 — AB (ref 0.6–3.4)
MCH: 29.3 pg (ref 27–31.2)
MCHC: 32.6 g/dL (ref 31.8–35.4)
MCV: 90.1 fL (ref 80–97)
MID (cbc): 0.8 (ref 0–0.9)
MPV: 8.1 fL (ref 0–99.8)
POC Granulocyte: 4.7 (ref 2–6.9)
POC LYMPH PERCENT: 38.7 %L (ref 10–50)
POC MID %: 9.4 % (ref 0–12)
Platelet Count, POC: 247 10*3/uL (ref 142–424)
RBC: 4.52 M/uL (ref 4.04–5.48)
RDW, POC: 14.1 %
WBC: 9 10*3/uL (ref 4.6–10.2)

## 2014-05-08 MED ORDER — SUCRALFATE 1 G PO TABS: ORAL_TABLET | ORAL | Status: DC

## 2014-05-08 MED ORDER — LANSOPRAZOLE 30 MG PO CPDR: 30.0000 mg | DELAYED_RELEASE_CAPSULE | Freq: Two times a day (BID) | ORAL | Status: DC

## 2014-05-08 NOTE — Telephone Encounter (Signed)
C/o:  Chest pain.  Dull ache.  Midsternal.  Rated: 4/10.  Denies pain radiating down arm, n/v, abdominal pain, jaw pain, chest tightness, or dizziness.   + Palpitations (have been having them on and off for years--stopped drinking coffee and eating tomatoes--has helped some), fatigue, and intermittent shortness of breath.  States chest pain started around May or June and has worsen this month.  Was treating with Zantac.  Zantac was no longer working, so she contacted Dr. Birdie Riddle via [facility] and had it switched to omeprazole.  Pt has been taking 1 pill in the morning and 1 pill in the evening.  She has noted some improvement, but the sensation is still there.  Pt admits that she has been under a lot of stress lately.  Husband just recently had a heart transplant and has returned back to work.  Mother with 2 small children.   Hx. Atypical chest pain, anxiety/depression, asthma, and GERD.  Advise: No openings today.  Pt was advised to go to UC on [address] to be evaluated for precaution.  Pt stated understanding and agreed.  She also stated that she would like to keep her appointment with Dr. Birdie Riddle on 05/14/14.  Appointment kept.

## 2014-05-08 NOTE — Patient Instructions (Signed)
Gastroesophageal Reflux Disease, Adult Gastroesophageal reflux disease (GERD) happens when acid from your stomach flows up into the esophagus. When acid comes in contact with the esophagus, the acid causes soreness (inflammation) in the esophagus. Over time, GERD may create small holes (ulcers) in the lining of the esophagus. CAUSES   Increased body weight. This puts pressure on the stomach, making acid rise from the stomach into the esophagus.  Smoking. This increases acid production in the stomach.  Drinking alcohol. This causes decreased pressure in the lower esophageal sphincter (valve or ring of muscle between the esophagus and stomach), allowing acid from the stomach into the esophagus.  Late evening meals and a full stomach. This increases pressure and acid production in the stomach.  A malformed lower esophageal sphincter. Sometimes, no cause is found. SYMPTOMS   Burning pain in the lower part of the mid-chest behind the breastbone and in the mid-stomach area. This may occur twice a week or more often.  Trouble swallowing.  Sore throat.  Dry cough.  Asthma-like symptoms including chest tightness, shortness of breath, or wheezing. DIAGNOSIS  Your caregiver may be able to diagnose GERD based on your symptoms. In some cases, X-rays and other tests may be done to check for complications or to check the condition of your stomach and esophagus. TREATMENT  Your caregiver may recommend over-the-counter or prescription medicines to help decrease acid production. Ask your caregiver before starting or adding any new medicines.  HOME CARE INSTRUCTIONS   Change the factors that you can control. Ask your caregiver for guidance concerning weight loss, quitting smoking, and alcohol consumption.  Avoid foods and drinks that make your symptoms worse, such as:  Caffeine or alcoholic drinks.  Chocolate.  Peppermint or mint flavorings.  Garlic and onions.  Spicy foods.  Citrus fruits,  such as oranges, lemons, or limes.  Tomato-based foods such as sauce, chili, salsa, and pizza.  Fried and fatty foods.  Avoid lying down for the 3 hours prior to your bedtime or prior to taking a nap.  Eat small, frequent meals instead of large meals.  Wear loose-fitting clothing. Do not wear anything tight around your waist that causes pressure on your stomach.  Raise the head of your bed 6 to 8 inches with wood blocks to help you sleep. Extra pillows will not help.  Only take over-the-counter or prescription medicines for pain, discomfort, or fever as directed by your caregiver.  Do not take aspirin, ibuprofen, or other nonsteroidal anti-inflammatory drugs (NSAIDs). SEEK IMMEDIATE MEDICAL CARE IF:   You have pain in your arms, neck, jaw, teeth, or back.  Your pain increases or changes in intensity or duration.  You develop nausea, vomiting, or sweating (diaphoresis).  You develop shortness of breath, or you faint.  Your vomit is green, yellow, black, or looks like coffee grounds or blood.  Your stool is red, bloody, or black. These symptoms could be signs of other problems, such as heart disease, gastric bleeding, or esophageal bleeding. MAKE SURE YOU:   Understand these instructions.  Will watch your condition.  Will get help right away if you are not doing well or get worse. Document Released: 07/05/2005 Document Revised: 12/18/2011 Document Reviewed: 04/14/2011 ExitCare Patient Information 2015 [employer]. This information is not intended to replace advice given to you by your health care provider. Make sure you discuss any questions you have with your health care provider.  

## 2014-05-08 NOTE — Progress Notes (Signed)
[facility] [address] [place] [address] [phone]  Date:  05/08/2014   Name:  Alexandra [name]   DOB:  1975/12/03   MRN:  [id number]  PCP:  Annye Asa, MD    Chief Complaint: Chest Pain, Fatigue and Shortness of Breath   History of Present Illness:  Alexandra [name] is a 38 y.o. very pleasant female patient who presents with the following:  History of chest pain for past couple months.  Says not associated with activity, exertion.  Does increase when drinks coffee soft drinks, alcohol or eats tomatoes or spaghetti sauce. Worse at night when lays down.  No nausea or vomiting.  No stool change, no melena or hematemesis.  Frequent waterbrash.  Says pain nearly constant now.  No improvement with otc zantac or prilosec.   No improvement with over the counter medications or other home remedies. Denies other complaint or health concern today.   Patient Active Problem List   Diagnosis Date Noted  . RUQ pain 12/08/2013  . Anxiety and depression 10/23/2013  . Chest pain, atypical 12/30/2012  . Malignant melanoma of lower leg - posterior left leg - .49 mm, superficial spreading 09/13/2012  . Hand weakness 08/29/2012  . Melanoma of lower leg 08/29/2012  . Physical exam, annual 05/27/2012  . Atypical nevus 05/27/2012  . History of gastroesophageal reflux (GERD) 10/24/2011  . Family history of malignant neoplasm of gastrointestinal tract 10/24/2011  . Anal skin tag 10/24/2011  . Bleeding hemorrhoids 10/11/2011  . Viral URI 09/09/2011  . Preterm labor 06/09/2011  . DE QUERVAIN'S TENOSYNOVITIS 09/23/2010  . LOW BACK PAIN SYNDROME 11/15/2009  . WRIST PAIN, RIGHT 01/21/2009  . ADJUSTMENT DISORDER 01/04/2009  . ASTHMA 12/18/2008  . COCCYGEAL PAIN 12/18/2008    Past Medical History  Diagnosis Date  . Gallbladder attack   . Gallstone   . Abnormal Pap smear     cryo 1999  . Family history of malignant neoplasm of gastrointestinal tract   . Hx: UTI  (urinary tract infection)   . Hemorrhoids   . Melanoma of lower limb   . Asthma     also has current cough,last attack <yr  . Allergy   . Anxiety   . GERD (gastroesophageal reflux disease)     Past Surgical History  Procedure Laterality Date  . Cholecystectomy    . Cryotherapy    . Melanoma excision  09/17/2012    Procedure: MELANOMA EXCISION;  Surgeon: Imogene Burn. Georgette Dover, MD;  Location: [facility];  Service: General;  Laterality: Left;  wide excision of meanoma-posterior left leg    History  Substance Use Topics  . Smoking status: Never Smoker   . Smokeless tobacco: Never Used  . Alcohol Use: No    Family History  Problem Relation Age of Onset  . Hypertension Father   . Diabetes Father   . Hyperlipidemia Father   . Breast cancer Maternal Grandmother   . Heart disease Maternal Grandfather   . Colon cancer      Paternal [employer]   . Diabetes Paternal Grandfather   . Mental illness Mother   . Depression Mother     Allergies  Allergen Reactions  . Sulfamethoxazole-Trimethoprim     REACTION: hives  . Adhesive [Tape] Rash    Steri strips    Medication list has been reviewed and updated.  Current Outpatient Prescriptions on File Prior to Visit  Medication Sig Dispense Refill  . ALPRAZolam (XANAX) 0.25 MG tablet  Take 2 tablets (0.5 mg total) by mouth daily as needed for anxiety.  60 tablet  3  . omeprazole (PRILOSEC OTC) 20 MG tablet Take 40 mg by mouth daily.      Marland Kitchen albuterol (PROAIR HFA) 108 (90 BASE) MCG/ACT inhaler Inhale 2 puffs into the lungs every 6 (six) hours as needed for wheezing.  1 Inhaler  1  . naproxen sodium (ANAPROX) 220 MG tablet Take 220 mg by mouth 2 (two) times daily with a meal.       No current facility-administered medications on file prior to visit.    Review of Systems:  As per HPI, otherwise negative.    Physical Examination: Filed Vitals:   05/08/14 1309  BP: 110/64  Pulse: 89  Temp: 98.3 F (36.8 C)  Resp: 18   Filed Vitals:    05/08/14 1309  Height: 5\' 5"  (1.651 m)  Weight: 132 lb (59.875 kg)   Body mass index is 21.97 kg/(m^2). Ideal Body Weight: Weight in (lb) to have BMI = 25: 149.9  GEN: WDWN, NAD, Non-toxic, A & O x 3 HEENT: Atraumatic, Normocephalic. Neck supple. No masses, No LAD. Ears and Nose: No external deformity. CV: RRR, No M/G/R. No JVD. No thrill. No extra heart sounds. PULM: CTA B, no wheezes, crackles, rhonchi. No retractions. No resp. distress. No accessory muscle use. ABD: S, NT, ND, +BS. No rebound. No HSM. EXTR: No c/c/e NEURO Normal gait.  PSYCH: Normally interactive. Conversant. Not depressed or anxious appearing.  Calm demeanor.    Assessment and Plan: GERD H.  Pylori carafate Prevacid  Signed,  Ellison Carwin, MD

## 2014-05-08 NOTE — Telephone Encounter (Signed)
Caller name: [place] Relation to pt: Call back number: [phone]  Reason for call: pt called stating she was having chest pains but thinks it's from heart burn.  I transferred her to [facility] but the pt called back to schedule an OV with Dr. Birdie Riddle.  She said no one picked up on the line and she didn't want to leave a cb #.  She only wanted to see Dr. Birdie Riddle and wanted to wait for her next opening.

## 2014-05-11 LAB — H. PYLORI ANTIBODY, IGG: H Pylori IgG: 0.5 {ISR}

## 2014-05-12 ENCOUNTER — Telehealth: Payer: Self-pay

## 2014-05-12 NOTE — Telephone Encounter (Signed)
PA needed for lansoprazole. Completed on covermymeds. Pt tried/failed zantac, omeprazole. Pending.

## 2014-05-14 ENCOUNTER — Encounter: Payer: Self-pay | Admitting: Family Medicine

## 2014-05-14 ENCOUNTER — Ambulatory Visit (INDEPENDENT_AMBULATORY_CARE_PROVIDER_SITE_OTHER): Payer: 59 | Admitting: Family Medicine

## 2014-05-14 VITALS — BP 112/74 | HR 79 | Temp 98.1°F | Resp 16 | Wt 133.2 lb

## 2014-05-14 DIAGNOSIS — K219 Gastro-esophageal reflux disease without esophagitis: Secondary | ICD-10-CM

## 2014-05-14 MED ORDER — PANTOPRAZOLE SODIUM 40 MG PO TBEC: 40.0000 mg | DELAYED_RELEASE_TABLET | Freq: Every day | ORAL | Status: DC

## 2014-05-14 NOTE — Patient Instructions (Signed)
Follow up as needed STOP the carafate, Prevacid START the Protonix daily Continue to limit acidic and spicy food Call with any questions or concerns Hang in there!

## 2014-05-14 NOTE — Assessment & Plan Note (Signed)
New to provider.  Recurrent for pt.  Not tolerating carafate, having nausea w/ Prevacid.  Has failed tx w/ Omeprazole 20 and 40mg .  Pt asking to switch PPI- mentioned pepcid but this is Tier 3 on her insurance, Protonix is tier 1.  Script sent for Protonix.  Reviewed dietary and lifestyle modifications.  Will follow.

## 2014-05-14 NOTE — Progress Notes (Signed)
   Subjective:    Patient ID: Alexandra [name], female    DOB: 05-11-1976, 38 y.o.   MRN: [id number]  HPI GERD- pt has hx of this, is typically diet related.  Went to UC on 7/31 and was given Carafate but this caused excessive bloating and abdominal cramping.  Pt was taking Prilosec OTC 2 tabs daily w/o improvement.  Was switched to Prevacid at [facility] and this caused nausea.  GERD has improved but pt reports continued 'dull tightness- pressure'.  Pt has cut way back on tomatoes, eliminated coffee.  No N/V.  (-) for H Pylori at UC   Review of Systems For ROS see HPI     Objective:   Physical Exam  Vitals reviewed. Constitutional: She is oriented to person, place, and time. She appears well-developed and well-nourished. No distress.  HENT:  Head: Normocephalic and atraumatic.  Abdominal: Soft. Bowel sounds are normal. She exhibits no distension. There is no tenderness. There is no rebound and no guarding.  Neurological: She is alert and oriented to person, place, and time.  Skin: Skin is warm and dry. No erythema.  Psychiatric: She has a normal mood and affect. Her behavior is normal. Thought content normal.          Assessment & Plan:

## 2014-05-14 NOTE — Progress Notes (Signed)
Pre visit review using our clinic review tool, if applicable. No additional management support is needed unless otherwise documented below in the visit note. 

## 2014-07-03 NOTE — Telephone Encounter (Signed)
Protonix was Rxd for pt on 05/14/14 by Dr Birdie Riddle.

## 2014-08-10 ENCOUNTER — Encounter: Payer: Self-pay | Admitting: Family Medicine

## 2014-12-12 ENCOUNTER — Ambulatory Visit (INDEPENDENT_AMBULATORY_CARE_PROVIDER_SITE_OTHER): Payer: 59 | Admitting: Physician Assistant

## 2014-12-12 VITALS — BP 106/68 | HR 107 | Temp 99.3°F | Resp 19 | Ht 64.75 in | Wt 131.4 lb

## 2014-12-12 DIAGNOSIS — J069 Acute upper respiratory infection, unspecified: Secondary | ICD-10-CM | POA: Diagnosis not present

## 2014-12-12 DIAGNOSIS — B9789 Other viral agents as the cause of diseases classified elsewhere: Principal | ICD-10-CM

## 2014-12-12 NOTE — Patient Instructions (Signed)
Get plenty of rest and drink at least 64 ounces of water daily. Use OTC cough medication and your albuterol inhaler as needed.

## 2014-12-12 NOTE — Progress Notes (Signed)
Patient ID: Alexandra [name], female    DOB: [birth year], 39 y.o.   MRN: [id number]  PCP: Annye Asa, MD  Subjective:   Chief Complaint  Patient presents with  . Fever    101.6 this am  . Sore Throat    x 2 days  . Cough    productive cough, phlegm  . Generalized Body Aches    mainly hips and legs  . Headache    HPI  Patient presents with illness x 2 days, concerned it may be the flu. No flu vaccine ever, "I don't like shots." Daughter has been ill x 4 days with similar symptoms. The patient has been caring for her daughter due to her husband s/p heart transplant x 2. Awoke yesterday with chest congestion. Now with body aches, fever, sore throat, cough is phlegmy, but nonproductive, headache. Feels really run down and fatigued. Concerned that oftentimes "It goes to my chest," and wants to make sure she stays on top of it.  Has an albuterol inhaler for PRN use but hasn't needed it. Takes Zyrtec daily. Since her daughter continues to have a low grade fever, she brought her in today and decided to be seen herself as well.  Mucinex causes nausea and diarrhea.  Review of Systems Review of Systems As above.    Patient Active Problem List   Diagnosis Date Noted  . GERD (gastroesophageal reflux disease) 05/14/2014  . RUQ pain 12/08/2013  . Anxiety and depression 10/23/2013  . Chest pain, atypical 12/30/2012  . Malignant melanoma of lower leg - posterior left leg - .49 mm, superficial spreading 09/13/2012  . Hand weakness 08/29/2012  . Melanoma of lower leg 08/29/2012  . Physical exam, annual 05/27/2012  . Atypical nevus 05/27/2012  . History of gastroesophageal reflux (GERD) 10/24/2011  . Family history of malignant neoplasm of gastrointestinal tract 10/24/2011  . Anal skin tag 10/24/2011  . Bleeding hemorrhoids 10/11/2011  . Viral URI 09/09/2011  . Preterm labor 06/09/2011  . DE QUERVAIN'S TENOSYNOVITIS 09/23/2010  . LOW BACK PAIN SYNDROME 11/15/2009  . WRIST  PAIN, RIGHT 01/21/2009  . ADJUSTMENT DISORDER 01/04/2009  . ASTHMA 12/18/2008  . COCCYGEAL PAIN 12/18/2008     Prior to Admission medications   Medication Sig Start Date End Date Taking? Authorizing Provider  ALPRAZolam Duanne Moron) 0.25 MG tablet Take 2 tablets (0.5 mg total) by mouth daily as needed for anxiety. 01/01/14  Yes Midge Minium, MD  cetirizine (ZYRTEC) 10 MG tablet Take 10 mg by mouth daily.   Yes Historical Provider, MD  naproxen sodium (ANAPROX) 220 MG tablet Take 220 mg by mouth 2 (two) times daily with a meal.   Yes Historical Provider, MD  pantoprazole (PROTONIX) 40 MG tablet Take 1 tablet (40 mg total) by mouth daily. 05/14/14  Yes Midge Minium, MD  albuterol (PROAIR HFA) 108 (90 BASE) MCG/ACT inhaler Inhale 2 puffs into the lungs every 6 (six) hours as needed for wheezing. 12/28/11 12/27/12  Rosalita Chessman, DO     Allergies  Allergen Reactions  . Sulfamethoxazole-Trimethoprim     REACTION: hives  . Adhesive [Tape] Rash    Steri strips       Objective:  Physical Exam  Physical Exam  Constitutional: She is oriented to person, place, and time. Vital signs are normal. She appears well-developed and well-nourished. No distress.  BP 106/68 mmHg  Pulse 107  Temp(Src) 99.3 F (37.4 C) (Oral)  Resp 19  Ht 5' 4.75" (1.645 m)  Wt 131 lb 6.4 oz (59.603 kg)  BMI 22.03 kg/m2  SpO2 97%  LMP 11/13/2014   HENT:  Head: Normocephalic and atraumatic.  Right Ear: Hearing, tympanic membrane, external ear and ear canal normal.  Left Ear: Hearing, tympanic membrane, external ear and ear canal normal.  Nose: Mucosal edema and rhinorrhea present.  No foreign bodies. Right sinus exhibits no maxillary sinus tenderness and no frontal sinus tenderness. Left sinus exhibits no maxillary sinus tenderness and no frontal sinus tenderness.  Mouth/Throat: Uvula is midline, oropharynx is clear and moist and mucous membranes are normal. No uvula swelling. No oropharyngeal exudate.    Eyes: Conjunctivae and EOM are normal. Pupils are equal, round, and reactive to light. Right eye exhibits no discharge. Left eye exhibits no discharge. No scleral icterus.  Neck: Trachea normal, normal range of motion and full passive range of motion without pain. Neck supple. No thyroid mass and no thyromegaly present.  Cardiovascular: Normal rate, regular rhythm and normal heart sounds.   Pulmonary/Chest: Effort normal and breath sounds normal.  Lymphadenopathy:       Head (right side): No submandibular, no tonsillar, no preauricular, no posterior auricular and no occipital adenopathy present.       Head (left side): No submandibular, no tonsillar, no preauricular and no occipital adenopathy present.    She has no cervical adenopathy.       Right: No supraclavicular adenopathy present.       Left: No supraclavicular adenopathy present.  Neurological: She is alert and oriented to person, place, and time. She has normal strength. No cranial nerve deficit or sensory deficit.  Skin: Skin is warm, dry and intact. No rash noted.  Psychiatric: She has a normal mood and affect. Her speech is normal and behavior is normal.           Assessment & Plan:   1. Viral URI with cough Supportive care.  Anticipatory guidance.  RTC if symptoms worsen/persist. OTC cough medication and albuterol inhaler PRN.     Fara Chute, PA-C Physician Assistant-Certified [facility]

## 2015-02-11 IMAGING — US US ABDOMEN COMPLETE
1 series · 14 of 25 positions shown · non-contrast
Comparison: 12/31/2009

CLINICAL DATA: Right upper quadrant pain

EXAM:
ULTRASOUND ABDOMEN COMPLETE

[Series 1: us abdomen complete · 0.23mm/px · 14 of 69 slices shown]
[im 1/69]
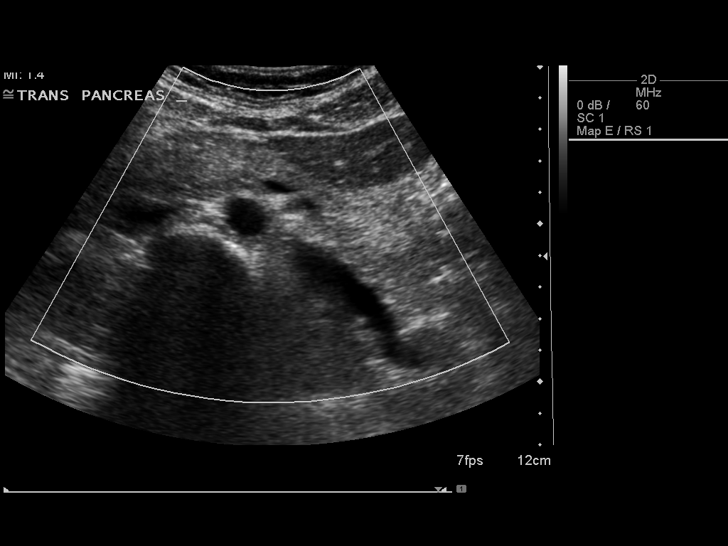
[im 6/69]
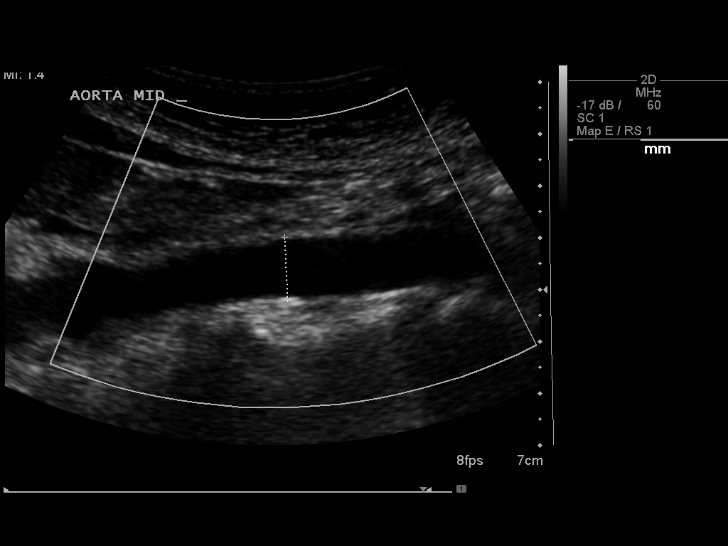
[im 12/69]
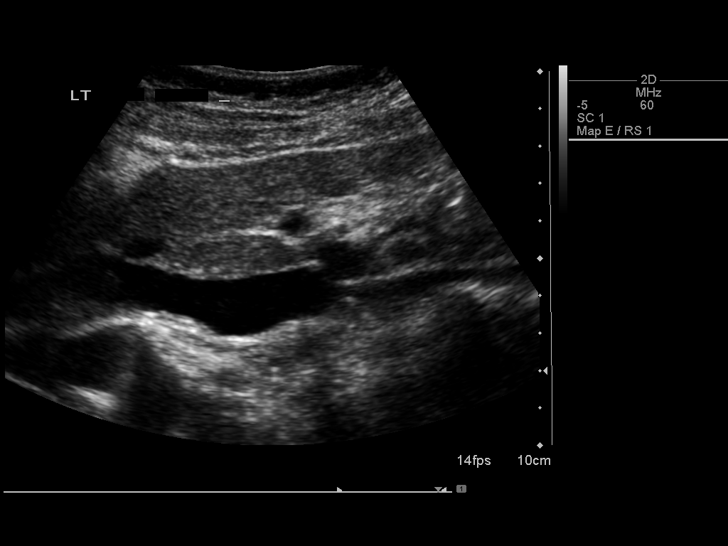
[im 18/69]
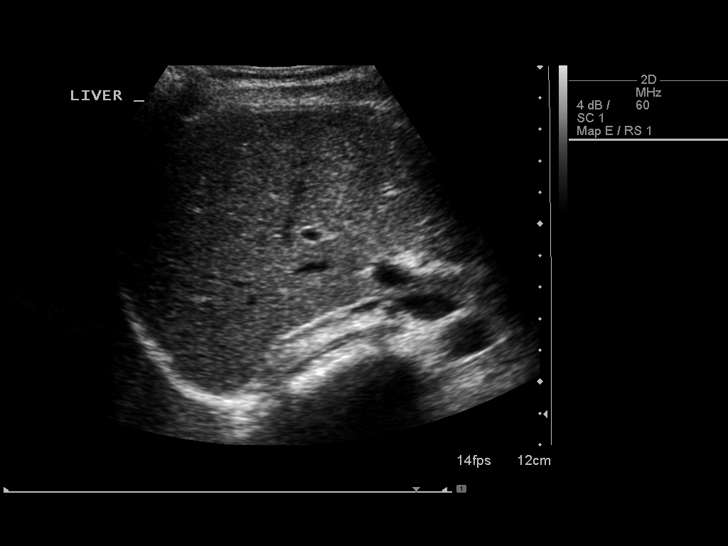
[im 23/69]
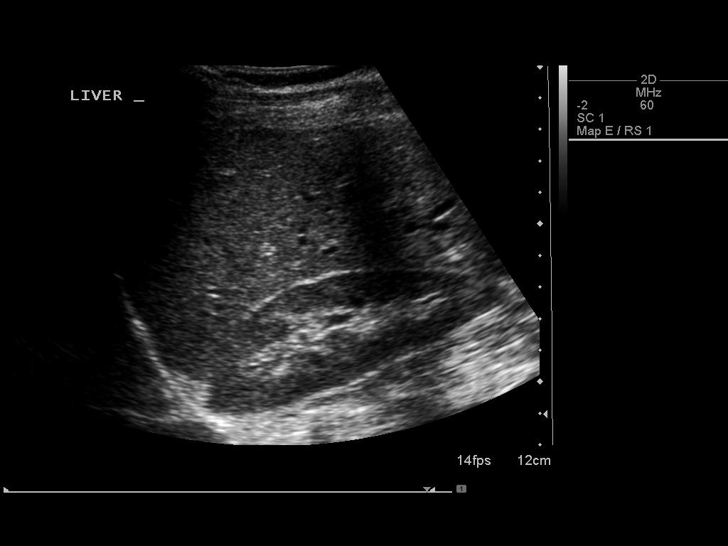
[im 26/69]
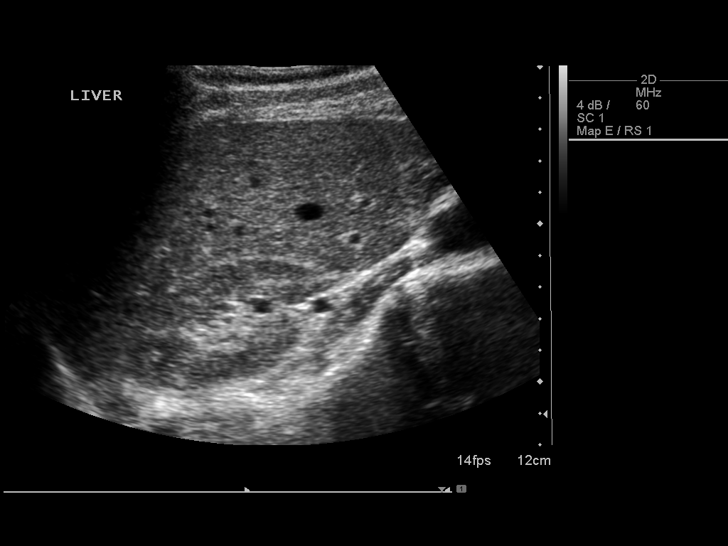
[im 32/69]
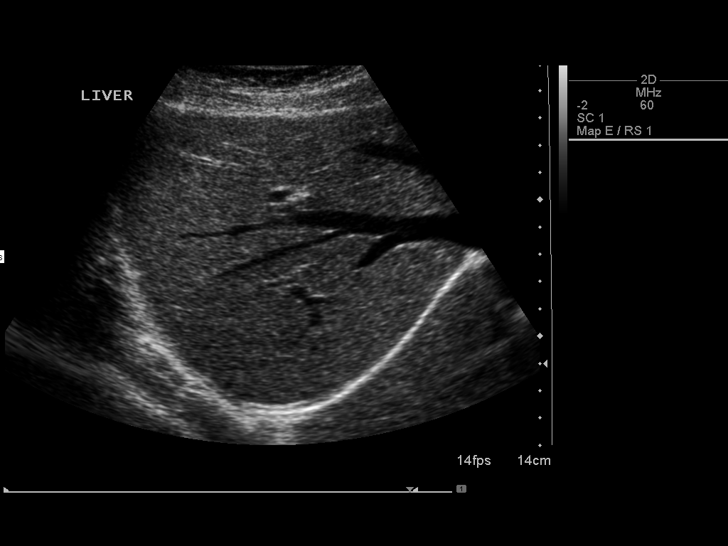
[im 37/69]
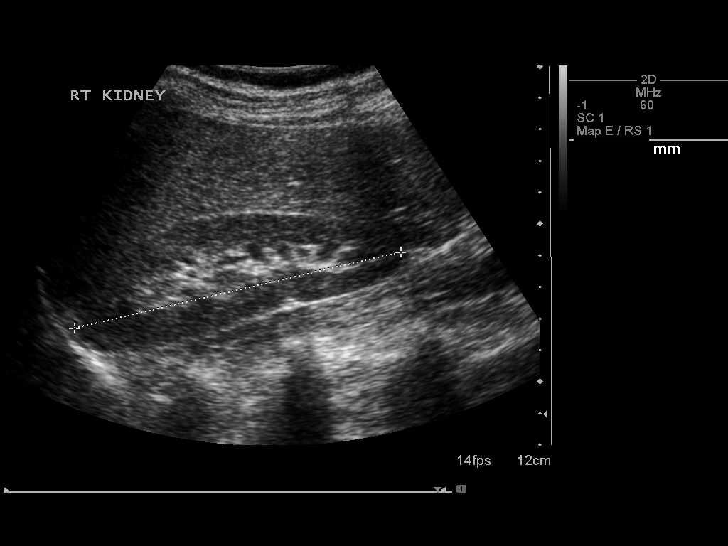
[im 43/69]
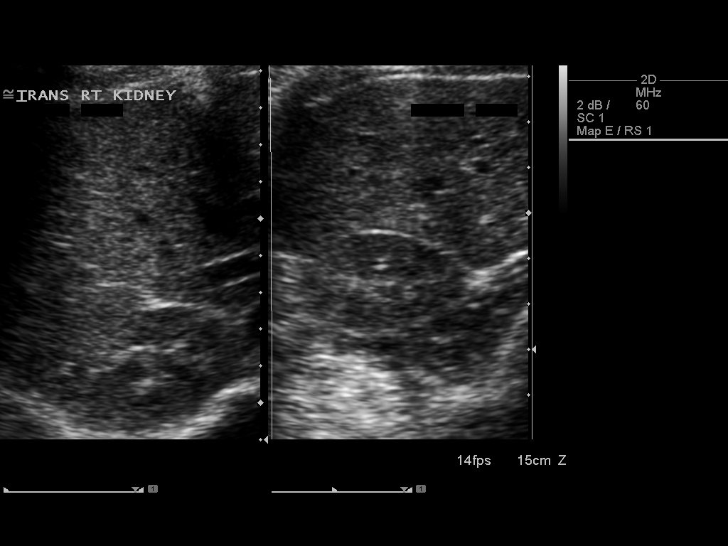
[im 46/69]
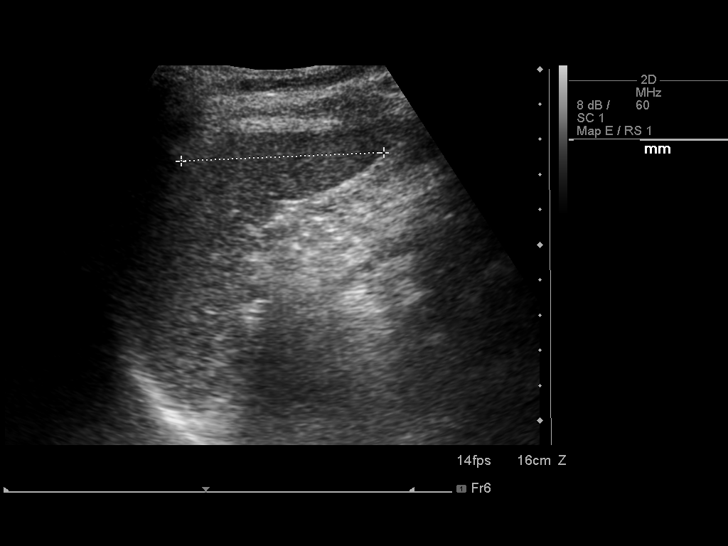
[im 52/69]
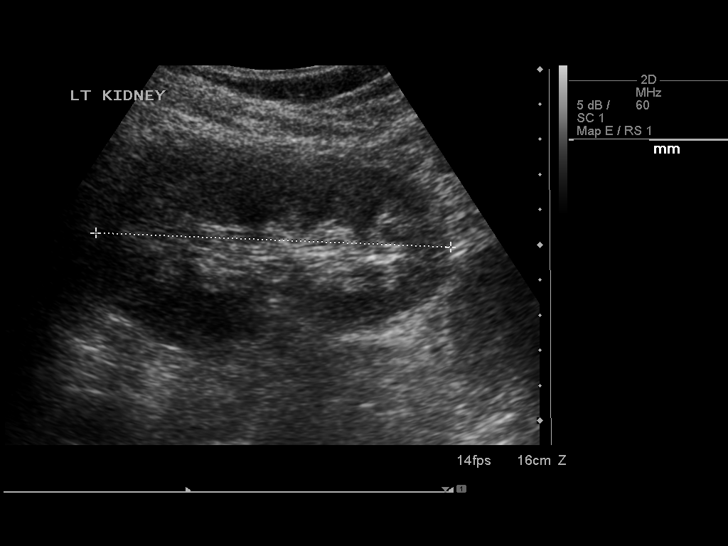
[im 57/69]
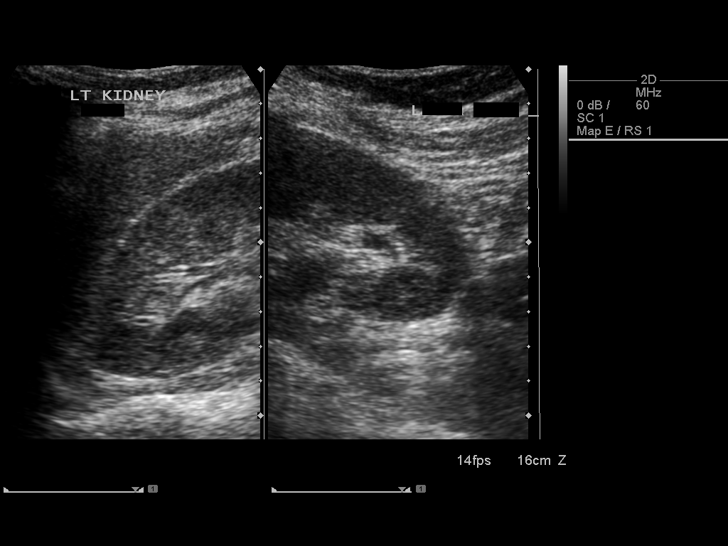
[im 63/69]
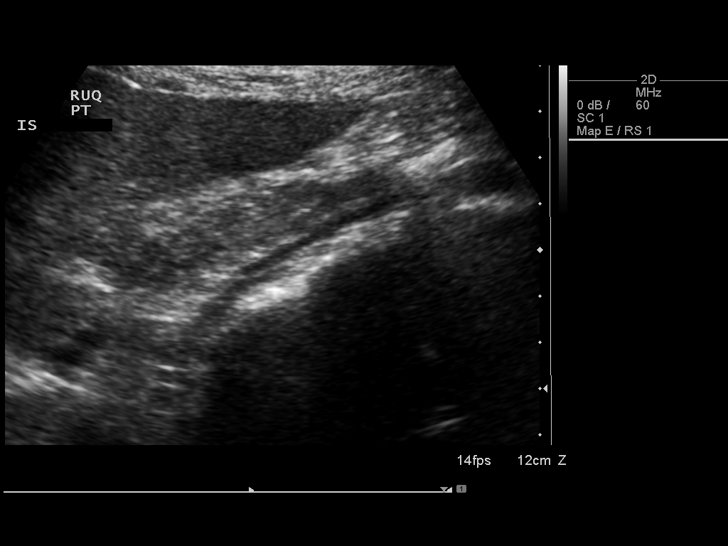
[im 69/69]
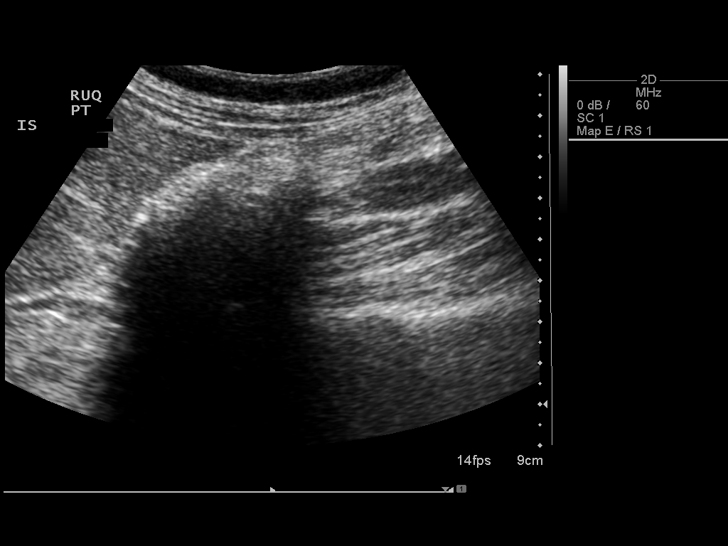

[14 of 25 positions shown; findings below may reference images not displayed]

FINDINGS: Gallbladder:

Surgically absent

Common bile duct:

Diameter: 2.5 mm in diameter within normal limits.

Liver:

No focal lesion identified. Within normal limits in parenchymal
echogenicity.

IVC:

No abnormality visualized.

Pancreas:

Visualized portion unremarkable.

Spleen:

Size and appearance within normal limits.  5.8 cm length

Right Kidney:

Length: 10.6 cm. Echogenicity within normal limits. No mass or
hydronephrosis visualized.

Left Kidney:

Length: 10.1 cm. Echogenicity within normal limits. No mass or
hydronephrosis visualized.

Abdominal aorta:

No aneurysm visualized.  Measures up to 1.7 cm in diameter.

Other findings:

No mass or hernia is identified in right upper quadrant.  .
IMPRESSION: Surgically absent gallbladder. Normal CBD. Unremarkable abdominal
ultrasound.

## 2015-04-16 ENCOUNTER — Other Ambulatory Visit: Payer: Self-pay | Admitting: Family Medicine

## 2015-04-16 NOTE — Telephone Encounter (Signed)
Med filled, pt needs a CPE.

## 2015-07-14 ENCOUNTER — Other Ambulatory Visit: Payer: Self-pay | Admitting: Obstetrics and Gynecology

## 2015-07-14 DIAGNOSIS — R1900 Intra-abdominal and pelvic swelling, mass and lump, unspecified site: Secondary | ICD-10-CM

## 2015-07-19 ENCOUNTER — Other Ambulatory Visit: Payer: Self-pay

## 2015-07-19 ENCOUNTER — Ambulatory Visit
Admission: RE | Admit: 2015-07-19 | Discharge: 2015-07-19 | Disposition: A | Discharge status: Home / Self Care | Payer: Managed Care, Other (non HMO) | Source: Ambulatory Visit | Attending: Obstetrics and Gynecology | Admitting: Obstetrics and Gynecology

## 2015-07-19 DIAGNOSIS — R1900 Intra-abdominal and pelvic swelling, mass and lump, unspecified site: Secondary | ICD-10-CM

## 2015-11-01 ENCOUNTER — Ambulatory Visit (INDEPENDENT_AMBULATORY_CARE_PROVIDER_SITE_OTHER): Payer: Managed Care, Other (non HMO) | Admitting: Family Medicine

## 2015-11-01 VITALS — BP 110/78 | HR 90 | Temp 98.4°F | Ht 65.0 in | Wt 130.0 lb

## 2015-11-01 DIAGNOSIS — J069 Acute upper respiratory infection, unspecified: Secondary | ICD-10-CM | POA: Diagnosis not present

## 2015-11-01 DIAGNOSIS — J029 Acute pharyngitis, unspecified: Secondary | ICD-10-CM | POA: Diagnosis not present

## 2015-11-01 DIAGNOSIS — B9789 Other viral agents as the cause of diseases classified elsewhere: Secondary | ICD-10-CM

## 2015-11-01 LAB — POCT RAPID STREP A (OFFICE): RAPID STREP A SCREEN: NEGATIVE

## 2015-11-01 MED ORDER — PREDNISONE 20 MG PO TABS: ORAL_TABLET | ORAL | Status: DC

## 2015-11-01 NOTE — Patient Instructions (Signed)
I will be in touch with your throat culture asap.   Use the prednisone for your swollen uvula. Benadryl may also help.   While you are on the prednisone avoid NSAID medications like ibuprofen or aleve but tylenol is ok  Let me know if you do not feel better in the next couple of days- Sooner if worse.

## 2015-11-01 NOTE — Progress Notes (Signed)
[facility] [address] [place] [address] [phone]  Date:  11/01/2015   Name:  Alexandra [name]   DOB:  1976/07/31   MRN:  [id number]  PCP:  Annye Asa, MD    Chief Complaint: Cough; Sore Throat; and Ear Pain   History of Present Illness:  Alexandra [name] is a 40 y.o. very pleasant female patient who presents with the following:  History of asthma and melanoma.  Here today with complaint of illness.  She noted onset of a tickle in her throat about 5 days ago- this got worse and she has felt very tired.  She has a dry cough.    The am she felt like her throat was swollen.  She took a shower and then blew her nose.  The sweling seemed to go down in her throat more on the left side.   She has pain in her ears, worse on the left side.  She did have a fever to 100.5 on Saturday, none on Sunday. Today is Monday She is using theraflu.   Her sx are more in her throat, but she did have some body aches Saturday.  She also tried some Emergen-C.   She did not take any antipyretics so far today She did have some loose stools but thought this was due to her medications.  No vomiting.   LMP last week Her asthma has not acted up in a couple of years- she has not noted any wheezing and has not needed her inhaler  Patient Active Problem List   Diagnosis Date Noted  . GERD (gastroesophageal reflux disease) 05/14/2014  . RUQ pain 12/08/2013  . Anxiety and depression 10/23/2013  . Chest pain, atypical 12/30/2012  . Malignant melanoma of lower leg - posterior left leg - .49 mm, superficial spreading 09/13/2012  . Hand weakness 08/29/2012  . Melanoma of lower leg ([place]) 08/29/2012  . Physical exam, annual 05/27/2012  . Atypical nevus 05/27/2012  . History of gastroesophageal reflux (GERD) 10/24/2011  . Family history of malignant neoplasm of gastrointestinal tract 10/24/2011  . Anal skin tag 10/24/2011  . Bleeding hemorrhoids 10/11/2011  . Viral URI  09/09/2011  . Preterm labor 06/09/2011  . DE QUERVAIN'S TENOSYNOVITIS 09/23/2010  . LOW BACK PAIN SYNDROME 11/15/2009  . WRIST PAIN, RIGHT 01/21/2009  . ADJUSTMENT DISORDER 01/04/2009  . ASTHMA 12/18/2008  . COCCYGEAL PAIN 12/18/2008    Past Medical History  Diagnosis Date  . Gallbladder attack   . Gallstone   . Abnormal Pap smear     cryo 1999  . Family history of malignant neoplasm of gastrointestinal tract   . Hx: UTI (urinary tract infection)   . Hemorrhoids   . Melanoma of lower limb ([place])   . Asthma     also has current cough,last attack <yr  . Allergy   . Anxiety   . GERD (gastroesophageal reflux disease)     Past Surgical History  Procedure Laterality Date  . Cholecystectomy    . Cryotherapy      cervical  . Melanoma excision  09/17/2012    Procedure: MELANOMA EXCISION;  Surgeon: Imogene Burn. Georgette Dover, MD;  Location: [facility];  Service: General;  Laterality: Left;  wide excision of meanoma-posterior left leg    Social History  Substance Use Topics  . Smoking status: Never Smoker   . Smokeless tobacco: Never Used  . Alcohol Use: No    Family History  Problem Relation Age of Onset  .  Hypertension Father   . Diabetes Father   . Hyperlipidemia Father   . Breast cancer Maternal Grandmother   . Heart disease Maternal Grandfather   . Colon cancer      Paternal [employer]   . Diabetes Paternal Grandfather   . Mental illness Mother     multiple personality disorder  . Depression Mother     Allergies  Allergen Reactions  . Sulfamethoxazole-Trimethoprim     REACTION: hives  . Adhesive [Tape] Rash    Steri strips    Medication list has been reviewed and updated.  Current Outpatient Prescriptions on File Prior to Visit  Medication Sig Dispense Refill  . albuterol (PROAIR HFA) 108 (90 BASE) MCG/ACT inhaler Inhale 2 puffs into the lungs every 6 (six) hours as needed for wheezing. 1 Inhaler 1  . cetirizine (ZYRTEC) 10 MG tablet Take 10 mg by mouth daily.  Reported on 11/01/2015    . naproxen sodium (ANAPROX) 220 MG tablet Take 220 mg by mouth 2 (two) times daily with a meal. Reported on 11/01/2015    . pantoprazole (PROTONIX) 40 MG tablet TAKE 1 TABLET BY MOUTH EVERY DAY (Patient not taking: Reported on 11/01/2015) 30 tablet 0   No current facility-administered medications on file prior to visit.    Review of Systems:  As per HPI- otherwise negative.   Physical Examination: Filed Vitals:   11/01/15 0838  BP: 110/78  Pulse: 117  Temp: 98.4 F (36.9 C)   Filed Vitals:   11/01/15 0838  Height: 5\' 5"  (1.651 m)  Weight: 130 lb (58.968 kg)   Body mass index is 21.63 kg/(m^2). Ideal Body Weight: Weight in (lb) to have BMI = 25: 149.9  GEN: WDWN, NAD, Non-toxic, A & O x 3, looks well, slim build HEENT: Atraumatic, Normocephalic. Neck supple. No masses, No LAD.  Bilateral TM wnl, oropharynx shows mild swelling of the uvula and ulceration on the left tonsil.  PEERL,EOMI.   Ears and Nose: No external deformity. CV: RRR, No M/G/R. No JVD. No thrill. No extra heart sounds. PULM: CTA B, no wheezes, crackles, rhonchi. No retractions. No resp. distress. No accessory muscle use. ABD: S, NT, ND EXTR: No c/c/e NEURO Normal gait.  PSYCH: Normally interactive. Conversant. Not depressed or anxious appearing.  Calm demeanor.   Results for orders placed or performed in visit on 11/01/15  POCT rapid strep A  Result Value Ref Range   Rapid Strep A Screen Negative Negative    Assessment and Plan: Sore throat - Plan: POCT rapid strep A, Culture, Group A Strep, predniSONE (DELTASONE) 20 MG tablet  Viral URI with cough  Treat with a short course of prednisone for her swollen uvula. Counseled that I do not expect her to have any airway compromise but if she does feel like she cannot breathe please seek immediate care  Signed Lamar Blinks, MD

## 2015-11-02 LAB — CULTURE, GROUP A STREP: ORGANISM ID, BACTERIA: NORMAL

## 2015-11-24 ENCOUNTER — Encounter: Payer: Self-pay | Admitting: Family Medicine

## 2015-11-25 ENCOUNTER — Ambulatory Visit (INDEPENDENT_AMBULATORY_CARE_PROVIDER_SITE_OTHER): Payer: Managed Care, Other (non HMO) | Admitting: Family Medicine

## 2015-11-25 VITALS — BP 108/66 | HR 84 | Temp 98.8°F | Resp 18 | Ht 65.5 in | Wt 129.6 lb

## 2015-11-25 DIAGNOSIS — H6502 Acute serous otitis media, left ear: Secondary | ICD-10-CM | POA: Diagnosis not present

## 2015-11-25 DIAGNOSIS — J029 Acute pharyngitis, unspecified: Secondary | ICD-10-CM | POA: Diagnosis not present

## 2015-11-25 DIAGNOSIS — J4520 Mild intermittent asthma, uncomplicated: Secondary | ICD-10-CM | POA: Diagnosis not present

## 2015-11-25 MED ORDER — ALBUTEROL SULFATE HFA 108 (90 BASE) MCG/ACT IN AERS: 2.0000 | INHALATION_SPRAY | RESPIRATORY_TRACT | Status: DC | PRN

## 2015-11-25 NOTE — Patient Instructions (Addendum)
Your exam is reassuring today. I suspect your sore throat is due to a virus or less likely allergies. Over the counter allegra or claritin if needed, cepacol or ricola, and drink plenty of fluids.  If sore throat and swollen lymph node not resolving in next 2 weeks - return for recheck.  Return to the clinic or go to the nearest emergency room if any of your symptoms worsen or new symptoms occur.  New albuterol prescription sent to your pharmacy if needed.   See info below about serous otitis. Can try sudafed over the counter if needed, or if traveling on plane or into mountains - Afrin nasal spray for a few days only if needed.    Serous Otitis Media Serous otitis media is fluid in the middle ear space. This space contains the bones for hearing and air. Air in the middle ear space helps to transmit sound.  The air gets there through the eustachian tube. This tube goes from the back of the nose (nasopharynx) to the middle ear space. It keeps the pressure in the middle ear the same as the outside world. It also helps to drain fluid from the middle ear space. CAUSES  Serous otitis media occurs when the eustachian tube gets blocked. Blockage can come from:  Ear infections.  Colds and other upper respiratory infections.  Allergies.  Irritants such as cigarette smoke.  Sudden changes in air pressure (such as descending in an airplane).  Enlarged adenoids.  A mass in the nasopharynx. During colds and upper respiratory infections, the middle ear space can become temporarily filled with fluid. This can happen after an ear infection also. Once the infection clears, the fluid will generally drain out of the ear through the eustachian tube. If it does not, then serous otitis media occurs. SIGNS AND SYMPTOMS   Hearing loss.  A feeling of fullness in the ear, without pain.  Young children may not show any symptoms but may show slight behavioral changes, such as agitation, ear pulling, or  crying. DIAGNOSIS  Serous otitis media is diagnosed by an ear exam. Tests may be done to check on the movement of the eardrum. Hearing exams may also be done. TREATMENT  The fluid most often goes away without treatment. If allergy is the cause, allergy treatment may be helpful. Fluid that persists for several months may require minor surgery. A small tube is placed in the eardrum to:  Drain the fluid.  Restore the air in the middle ear space. In certain situations, antibiotic medicines are used to avoid surgery. Surgery may be done to remove enlarged adenoids (if this is the cause). HOME CARE INSTRUCTIONS   Keep children away from tobacco smoke.  Keep all follow-up visits as directed by your health care provider. SEEK MEDICAL CARE IF:   Your hearing is not better in 3 months.  Your hearing is worse.  You have ear pain.  You have drainage from the ear.  You have dizziness.  You have serous otitis media only in one ear or have any bleeding from your nose (epistaxis).  You notice a lump on your neck. MAKE SURE YOU:  Understand these instructions.   Will watch your condition.   Will get help right away if you are not doing well or get worse.    This information is not intended to replace advice given to you by your health care provider. Make sure you discuss any questions you have with your health care provider.   Document  Released: 12/16/2003 Document Revised: 10/16/2014 Document Reviewed: 04/22/2013 Elsevier Interactive Patient Education 2016 Elsevier Inc.   Sore Throat A sore throat is pain, burning, irritation, or scratchiness of the throat. There is often pain or tenderness when swallowing or talking. A sore throat may be accompanied by other symptoms, such as coughing, sneezing, fever, and swollen neck glands. A sore throat is often the first sign of another sickness, such as a cold, flu, strep throat, or mononucleosis (commonly known as mono). Most sore throats go  away without medical treatment. CAUSES  The most common causes of a sore throat include:  A viral infection, such as a cold, flu, or mono.  A bacterial infection, such as strep throat, tonsillitis, or whooping cough.  Seasonal allergies.  Dryness in the air.  Irritants, such as smoke or pollution.  Gastroesophageal reflux disease (GERD). HOME CARE INSTRUCTIONS   Only take over-the-counter medicines as directed by your caregiver.  Drink enough fluids to keep your urine clear or pale yellow.  Rest as needed.  Try using throat sprays, lozenges, or sucking on hard candy to ease any pain (if older than 4 years or as directed).  Sip warm liquids, such as broth, herbal tea, or warm water with honey to relieve pain temporarily. You may also eat or drink cold or frozen liquids such as frozen ice pops.  Gargle with salt water (mix 1 tsp salt with 8 oz of water).  Do not smoke and avoid secondhand smoke.  Put a cool-mist humidifier in your bedroom at night to moisten the air. You can also turn on a hot shower and sit in the bathroom with the door closed for 5-10 minutes. SEEK IMMEDIATE MEDICAL CARE IF:  You have difficulty breathing.  You are unable to swallow fluids, soft foods, or your saliva.  You have increased swelling in the throat.  Your sore throat does not get better in 7 days.  You have nausea and vomiting.  You have a fever or persistent symptoms for more than 2-3 days.  You have a fever and your symptoms suddenly get worse. MAKE SURE YOU:   Understand these instructions.  Will watch your condition.  Will get help right away if you are not doing well or get worse.   This information is not intended to replace advice given to you by your health care provider. Make sure you discuss any questions you have with your health care provider.   Document Released: 11/02/2004 Document Revised: 10/16/2014 Document Reviewed: 06/02/2012 Elsevier Interactive Patient  Education [employer].

## 2015-11-25 NOTE — Progress Notes (Signed)
Subjective:  By signing my name below, I, Rawaa Al Rifaie, attest that this documentation has been prepared under the direction and in the presence of Merri Ray, MD.  Leandra Kern, Medical Scribe. 11/25/2015.  8:57 AM.   Patient ID: Alexandra [name], female    DOB: Mar 07, 1976, 40 y.o.   MRN: [id number]  Chief Complaint  Patient presents with  . Sore Throat    x1 month    HPI HPI Comments: Alexandra [name] is a 40 y.o. female who presents to [facility] complaining of sore throat, onset 1 month ago.  Pt reports that the pt was seen here in January for sore throat and ear ache. She indicates that strep test was negative, and she was prescribed prednisone. Pt states that her symptoms initially improved, however she indicates the symptoms did worsen within the past 2 days with the associated symptoms of blisters in the back of her throat, fluid in the ear with popping sensation, dry cough, improving chest congestion (this is not present today). She took Tylenol yesterday which she indicates had improved her symptoms. Pt reports sick contact with her daughter. She denies ear drainage, hearing difficulty, rhinorrhea. Pt notes that she had the stomach bug about 4 days ago, when she had fever and diarrhea, however no vomiting. Otherwise pt did not have another fever symptoms. Pt has a history of asthma for which she uses an albuterol inhaler. Pt is requesting a refill for this since it has expired about 3 years ago. She reports no recent flare ups. She is not currently taking any medications for allergies. Pt decline blood testing or strep throat test due to noting that her symptoms are not severe enough.  Pt's husband has recently had a heart transplant, and she is concerned whether her symptoms can affect him.    Patient Active Problem List   Diagnosis Date Noted  . GERD (gastroesophageal reflux disease) 05/14/2014  . RUQ pain 12/08/2013  . Anxiety and depression  10/23/2013  . Chest pain, atypical 12/30/2012  . Malignant melanoma of lower leg - posterior left leg - .49 mm, superficial spreading 09/13/2012  . Hand weakness 08/29/2012  . Melanoma of lower leg ([place]) 08/29/2012  . Physical exam, annual 05/27/2012  . Atypical nevus 05/27/2012  . History of gastroesophageal reflux (GERD) 10/24/2011  . Family history of malignant neoplasm of gastrointestinal tract 10/24/2011  . Anal skin tag 10/24/2011  . Bleeding hemorrhoids 10/11/2011  . Viral URI 09/09/2011  . Preterm labor 06/09/2011  . DE QUERVAIN'S TENOSYNOVITIS 09/23/2010  . LOW BACK PAIN SYNDROME 11/15/2009  . WRIST PAIN, RIGHT 01/21/2009  . ADJUSTMENT DISORDER 01/04/2009  . ASTHMA 12/18/2008  . COCCYGEAL PAIN 12/18/2008   Past Medical History  Diagnosis Date  . Gallbladder attack   . Gallstone   . Abnormal Pap smear     cryo 1999  . Family history of malignant neoplasm of gastrointestinal tract   . Hx: UTI (urinary tract infection)   . Hemorrhoids   . Melanoma of lower limb ([place])   . Asthma     also has current cough,last attack <yr  . Allergy   . Anxiety   . GERD (gastroesophageal reflux disease)    Past Surgical History  Procedure Laterality Date  . Cholecystectomy    . Cryotherapy      cervical  . Melanoma excision  09/17/2012    Procedure: MELANOMA EXCISION;  Surgeon: Imogene Burn. Georgette Dover, MD;  Location: [facility];  Service: General;  Laterality: Left;  wide excision of meanoma-posterior left leg   Allergies  Allergen Reactions  . Sulfamethoxazole-Trimethoprim     REACTION: hives  . Adhesive [Tape] Rash    Steri strips   Prior to Admission medications   Medication Sig Start Date End Date Taking? Authorizing Provider  ranitidine (ZANTAC) 150 MG capsule Take 150 mg by mouth 2 (two) times daily. Reported on 11/25/2015   Yes Historical Provider, MD  albuterol (PROAIR HFA) 108 (90 BASE) MCG/ACT inhaler Inhale 2 puffs into the lungs every 6 (six) hours as needed for wheezing.  12/28/11 12/27/12  Rosalita Chessman, DO  cetirizine (ZYRTEC) 10 MG tablet Take 10 mg by mouth daily. Reported on 11/25/2015    Historical Provider, MD  naproxen sodium (ANAPROX) 220 MG tablet Take 220 mg by mouth 2 (two) times daily with a meal. Reported on 11/25/2015    Historical Provider, MD  predniSONE (DELTASONE) 20 MG tablet Take 2 pills a day for 3 days, then 1 pill a day for 3 days Patient not taking: Reported on 11/25/2015 11/01/15   Darreld Mclean, MD   Social History   Social History  . Marital Status: Married    Spouse Name: Rodman Key  . Number of Children: 2  . Years of Education: college   Occupational History  . Photographer     self-employed (weddings)   Social History Main Topics  . Smoking status: Never Smoker   . Smokeless tobacco: Never Used  . Alcohol Use: No  . Drug Use: No  . Sexual Activity:    Partners: Male    Birth Control/ Protection: None     Comment: Husband s/p vasectomy   Other Topics Concern  . Not on file   Social History Narrative   No caffeine.    Lives with her husband and their two children.   Minimal contact with her mother.   Father lives in [place], [place].   One brother is in [place], [place], and one in [place], [place].    Review of Systems  Constitutional: Negative for fever.  HENT: Positive for congestion, ear pain and sore throat. Negative for ear discharge, hearing loss and rhinorrhea.   Respiratory: Positive for cough.       Objective:   Physical Exam  Constitutional: She is oriented to person, place, and time. She appears well-developed and well-nourished. No distress.  HENT:  Head: Normocephalic and atraumatic.  Right Ear: Hearing, tympanic membrane, external ear and ear canal normal.  Left Ear: Hearing, tympanic membrane, external ear and ear canal normal.  Nose: Nose normal.  Mouth/Throat: Oropharynx is clear and moist. No oropharyngeal exudate.  Minimal clear fluid at the base of the right TM. No erythema, canal is clear  Clear  fluid at the left TM. No erythema. Canal is clear  Moist oral mucosa.  No oral lesions.  No exudate at the tonsil. No vesicle. No tonsillar erythema.  Eyes: Conjunctivae and EOM are normal. Pupils are equal, round, and reactive to light.  Neck: Neck supple.  Tender AC node, right greater than left. Slightly enlarges. No other lymphadenopathy.   Cardiovascular: Normal rate, regular rhythm, normal heart sounds and intact distal pulses.   No murmur heard. Pulmonary/Chest: Effort normal and breath sounds normal. No respiratory distress. She has no wheezes. She has no rhonchi.  Abdominal: There is no hepatosplenomegaly. There is no tenderness.  Neurological: She is alert and oriented to person, place, and time. No cranial nerve deficit.  Skin:  Skin is warm and dry. No rash noted.  Psychiatric: She has a normal mood and affect. Her behavior is normal.  Nursing note and vitals reviewed.    Filed Vitals:   11/25/15 0828  BP: 108/66  Pulse: 84  Temp: 98.8 F (37.1 C)  TempSrc: Oral  Resp: 18  Height: 5' 5.5" (1.664 m)  Weight: 129 lb 9.6 oz (58.786 kg)  SpO2: 99%      Assessment & Plan:   Alexandra [name] is a 40 y.o. female Asthma, mild intermittent, uncomplicated - Plan: albuterol (PROAIR HFA) 108 (90 Base) MCG/ACT inhaler  Sore throat  Acute serous otitis media of left ear, recurrence not specified Suspected viral pharyngitis, had improved from previous infection, then some recurrent sore throat. No exudate on tonsils, mildly enlarged/tender AC node, but no other lymphadenopathy,  Afebrile,  And slight cough, suspected viral illness.  - Offered EBV and CMV testing, but deferred at this time. Also offered repeat strep test, but also deferred, and her symptoms and exam do not appear to be strep today.  -Symptomatic care was Cepacol or other cough drops, fluids, Tylenol as needed.  -Serous otitis from previous viral infection likely. Symptomatic care discussed and handout given.    -RTC precautions if sore throat persists next 2 weeks, or lymphadenopathy is not resolving.  Mild intermittent asthma, currently asymptomatic. Albuterol prescription given if needed. RTC precautions given.   Meds ordered this encounter  Medications  . albuterol (PROAIR HFA) 108 (90 Base) MCG/ACT inhaler    Sig: Inhale 2 puffs into the lungs every 4 (four) hours as needed for wheezing.    Dispense:  1 Inhaler    Refill:  0   Patient Instructions  Your exam is reassuring today. I suspect your sore throat is due to a virus or less likely allergies. Over the counter allegra or claritin if needed, cepacol or ricola, and drink plenty of fluids.  If sore throat and swollen lymph node not resolving in next 2 weeks - return for recheck.  Return to the clinic or go to the nearest emergency room if any of your symptoms worsen or new symptoms occur.  New albuterol prescription sent to your pharmacy if needed.   See info below about serous otitis. Can try sudafed over the counter if needed, or if traveling on plane or into mountains - Afrin nasal spray for a few days only if needed.    Serous Otitis Media Serous otitis media is fluid in the middle ear space. This space contains the bones for hearing and air. Air in the middle ear space helps to transmit sound.  The air gets there through the eustachian tube. This tube goes from the back of the nose (nasopharynx) to the middle ear space. It keeps the pressure in the middle ear the same as the outside world. It also helps to drain fluid from the middle ear space. CAUSES  Serous otitis media occurs when the eustachian tube gets blocked. Blockage can come from:  Ear infections.  Colds and other upper respiratory infections.  Allergies.  Irritants such as cigarette smoke.  Sudden changes in air pressure (such as descending in an airplane).  Enlarged adenoids.  A mass in the nasopharynx. During colds and upper respiratory infections, the  middle ear space can become temporarily filled with fluid. This can happen after an ear infection also. Once the infection clears, the fluid will generally drain out of the ear through the eustachian tube. If it does not,  then serous otitis media occurs. SIGNS AND SYMPTOMS   Hearing loss.  A feeling of fullness in the ear, without pain.  Young children may not show any symptoms but may show slight behavioral changes, such as agitation, ear pulling, or crying. DIAGNOSIS  Serous otitis media is diagnosed by an ear exam. Tests may be done to check on the movement of the eardrum. Hearing exams may also be done. TREATMENT  The fluid most often goes away without treatment. If allergy is the cause, allergy treatment may be helpful. Fluid that persists for several months may require minor surgery. A small tube is placed in the eardrum to:  Drain the fluid.  Restore the air in the middle ear space. In certain situations, antibiotic medicines are used to avoid surgery. Surgery may be done to remove enlarged adenoids (if this is the cause). HOME CARE INSTRUCTIONS   Keep children away from tobacco smoke.  Keep all follow-up visits as directed by your health care provider. SEEK MEDICAL CARE IF:   Your hearing is not better in 3 months.  Your hearing is worse.  You have ear pain.  You have drainage from the ear.  You have dizziness.  You have serous otitis media only in one ear or have any bleeding from your nose (epistaxis).  You notice a lump on your neck. MAKE SURE YOU:  Understand these instructions.   Will watch your condition.   Will get help right away if you are not doing well or get worse.    This information is not intended to replace advice given to you by your health care provider. Make sure you discuss any questions you have with your health care provider.   Document Released: 12/16/2003 Document Revised: 10/16/2014 Document Reviewed: 04/22/2013 Elsevier Interactive  Patient Education 2016 Elsevier Inc.   Sore Throat A sore throat is pain, burning, irritation, or scratchiness of the throat. There is often pain or tenderness when swallowing or talking. A sore throat may be accompanied by other symptoms, such as coughing, sneezing, fever, and swollen neck glands. A sore throat is often the first sign of another sickness, such as a cold, flu, strep throat, or mononucleosis (commonly known as mono). Most sore throats go away without medical treatment. CAUSES  The most common causes of a sore throat include:  A viral infection, such as a cold, flu, or mono.  A bacterial infection, such as strep throat, tonsillitis, or whooping cough.  Seasonal allergies.  Dryness in the air.  Irritants, such as smoke or pollution.  Gastroesophageal reflux disease (GERD). HOME CARE INSTRUCTIONS   Only take over-the-counter medicines as directed by your caregiver.  Drink enough fluids to keep your urine clear or pale yellow.  Rest as needed.  Try using throat sprays, lozenges, or sucking on hard candy to ease any pain (if older than 4 years or as directed).  Sip warm liquids, such as broth, herbal tea, or warm water with honey to relieve pain temporarily. You may also eat or drink cold or frozen liquids such as frozen ice pops.  Gargle with salt water (mix 1 tsp salt with 8 oz of water).  Do not smoke and avoid secondhand smoke.  Put a cool-mist humidifier in your bedroom at night to moisten the air. You can also turn on a hot shower and sit in the bathroom with the door closed for 5-10 minutes. SEEK IMMEDIATE MEDICAL CARE IF:  You have difficulty breathing.  You are unable to swallow fluids,  soft foods, or your saliva.  You have increased swelling in the throat.  Your sore throat does not get better in 7 days.  You have nausea and vomiting.  You have a fever or persistent symptoms for more than 2-3 days.  You have a fever and your symptoms suddenly  get worse. MAKE SURE YOU:   Understand these instructions.  Will watch your condition.  Will get help right away if you are not doing well or get worse.   This information is not intended to replace advice given to you by your health care provider. Make sure you discuss any questions you have with your health care provider.   Document Released: 11/02/2004 Document Revised: 10/16/2014 Document Reviewed: 06/02/2012 Elsevier Interactive Patient Education [employer].     I personally performed the services described in this documentation, which was scribed in my presence. The recorded information has been reviewed and considered, and addended by me as needed.

## 2015-12-14 ENCOUNTER — Telehealth: Payer: Self-pay | Admitting: Family Medicine

## 2015-12-14 NOTE — Telephone Encounter (Signed)
LVM inquiring if patient received flu shot  °

## 2016-07-11 LAB — HEPATIC FUNCTION PANEL
ALT: 21 U/L (ref 7–35)
AST: 18 U/L (ref 13–35)
Alkaline Phosphatase: 66 U/L (ref 25–125)
BILIRUBIN, TOTAL: 0.4 mg/dL

## 2016-07-11 LAB — BASIC METABOLIC PANEL
BUN: 12 mg/dL (ref 4–21)
CREATININE: 0.7 mg/dL (ref 0.5–1.1)
GLUCOSE: 101 mg/dL
POTASSIUM: 5 mmol/L (ref 3.4–5.3)
Sodium: 141 mmol/L (ref 137–147)

## 2016-07-11 LAB — CBC AND DIFFERENTIAL
HCT: 39 % (ref 36–46)
Hemoglobin: 13.6 g/dL (ref 12.0–16.0)
Platelets: 281 10*3/uL (ref 150–399)
WBC: 6.3 10*3/mL

## 2016-07-11 LAB — HEMOGLOBIN A1C: HEMOGLOBIN A1C: 5.4

## 2016-07-11 LAB — TSH: TSH: 1.59 u[IU]/mL (ref 0.41–5.90)

## 2016-07-11 LAB — LIPID PANEL
Cholesterol: 204 mg/dL — AB (ref 0–200)
HDL: 57 mg/dL (ref 35–70)
LDL Cholesterol: 122 mg/dL
Triglycerides: 124 mg/dL (ref 40–160)

## 2016-08-25 ENCOUNTER — Other Ambulatory Visit: Payer: Self-pay | Admitting: Family Medicine

## 2016-08-25 DIAGNOSIS — J4520 Mild intermittent asthma, uncomplicated: Secondary | ICD-10-CM

## 2016-08-29 ENCOUNTER — Encounter: Payer: Self-pay | Admitting: Family Medicine

## 2016-08-29 ENCOUNTER — Ambulatory Visit (INDEPENDENT_AMBULATORY_CARE_PROVIDER_SITE_OTHER): Payer: Managed Care, Other (non HMO) | Admitting: Family Medicine

## 2016-08-29 VITALS — BP 106/80 | HR 79 | Temp 98.1°F | Resp 16 | Ht 66.0 in | Wt 136.1 lb

## 2016-08-29 DIAGNOSIS — R0781 Pleurodynia: Secondary | ICD-10-CM | POA: Diagnosis not present

## 2016-08-29 MED ORDER — MELOXICAM 15 MG PO TABS: 15.0000 mg | ORAL_TABLET | Freq: Every day | ORAL | 1 refills | Status: DC

## 2016-08-29 NOTE — Progress Notes (Signed)
   Subjective:    Patient ID: Alexandra [name], female    DOB: [birth year], 40 y.o.   MRN: [id number]  HPI Rib pain- pt started yoga in September and developed pain in R side.  Pain will wax and wane- Sunday was a 7 and today is a 4.  Pain is located at lower R ribs and TTP.  Pt asked at GYN last month and was told pain was musculoskeletal and to use heating pad and ibuprofen but this was ineffective.  Pain is described as 'someone shoved a spoon in there'.     Review of Systems For ROS see HPI     Objective:   Physical Exam  Constitutional: She is oriented to person, place, and time. She appears well-developed and well-nourished. No distress.  HENT:  Head: Normocephalic and atraumatic.  Pulmonary/Chest: Effort normal. No respiratory distress.  Abdominal: Soft. She exhibits no distension and no mass. There is tenderness (TTP over R false ribs at connection to true ribs in mid-clavicular line and around to mid-axillary line.  no hepatomegaly or liver TTP). There is no rebound and no guarding.  Neurological: She is alert and oriented to person, place, and time.  Skin: Skin is warm and dry.  Psychiatric: She has a normal mood and affect. Her behavior is normal. Thought content normal.  Vitals reviewed.         Assessment & Plan:  R rib pain- new.  Pain localized over false ribs and particularly at their cartilaginous attachment to true ribs.  Start scheduled NSAID.  Refer to Chiropractic care.  Reviewed supportive care and red flags that should prompt return.  Pt expressed understanding and is in agreement w/ plan.

## 2016-08-29 NOTE — Progress Notes (Signed)
Pre visit review using our clinic review tool, if applicable. No additional management support is needed unless otherwise documented below in the visit note. 

## 2016-08-29 NOTE — Patient Instructions (Signed)
Follow up as needed- particularly if no improvement Start the Meloxicam once daily- take w/ food- for 2 weeks and then as needed I put in a referral for [place] but if you want to call and schedule on your own- you can do that Continue to use a heating pad for pain relief No additional anti-inflammatories while on the Mobic but you can add tylenol Call with any questions or concerns Happy Holidays!!!

## 2016-09-05 ENCOUNTER — Encounter: Payer: Self-pay | Admitting: General Practice

## 2016-09-18 IMAGING — US US PELVIS LIMITED
1 series · 9 of 9 positions shown · non-contrast
Comparison: None.

CLINICAL DATA: Palpable right groin mass

EXAM:
US PELVIS LIMITED
TECHNIQUE: Ultrasound examination of the pelvic soft tissues was performed in
the area of clinical concern.

[Series 1: us pelvis limited · 0.06mm/px · 9 of 9 slices shown]
[im 1/9]
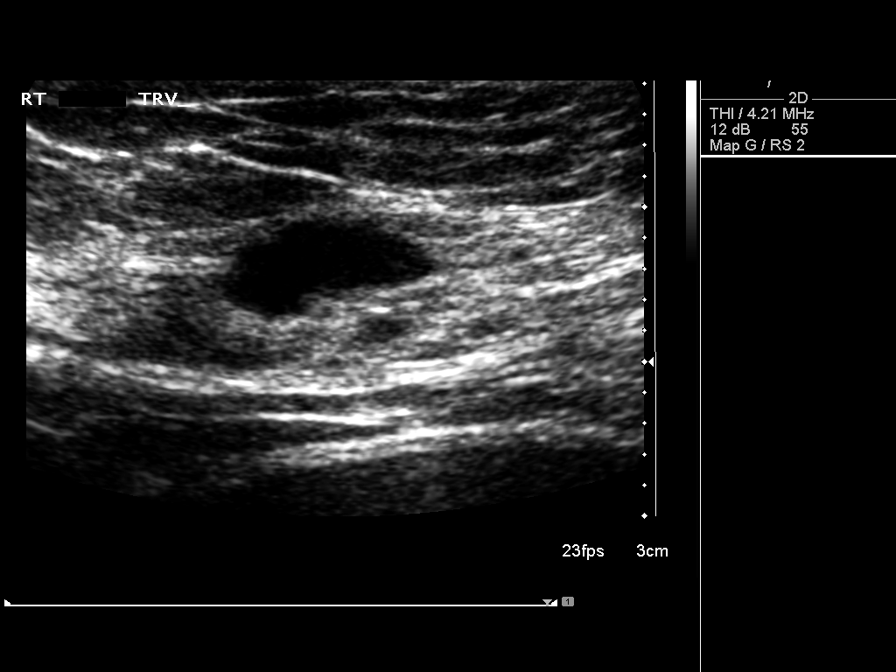
[im 2/9]
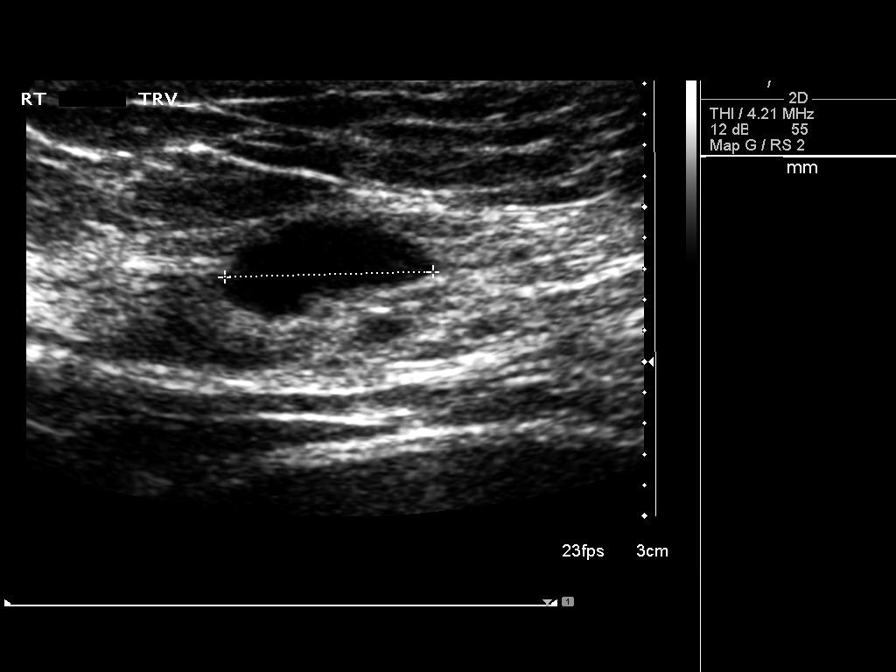
[im 3/9]
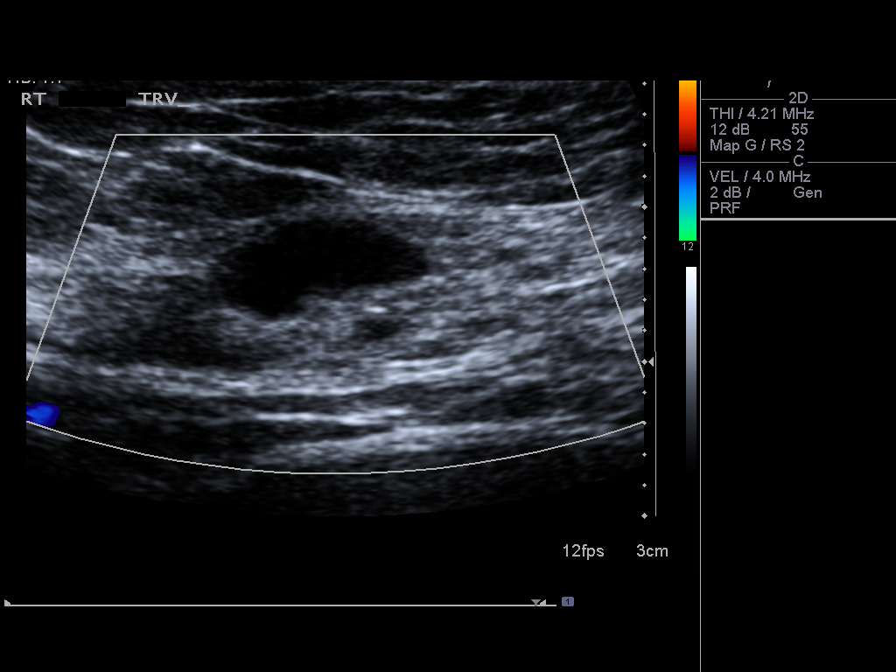
[im 4/9]
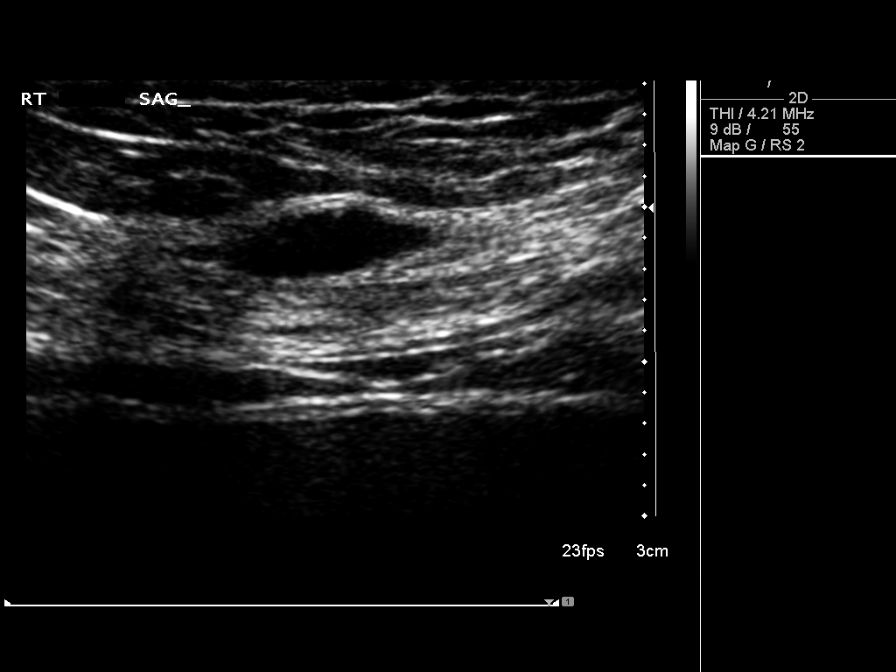
[im 5/9]
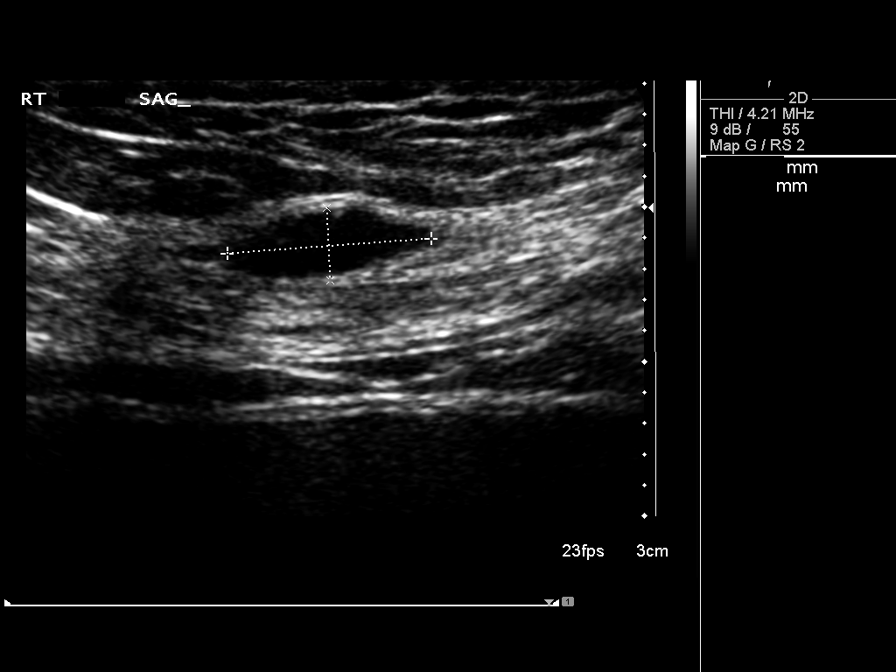
[im 6/9]
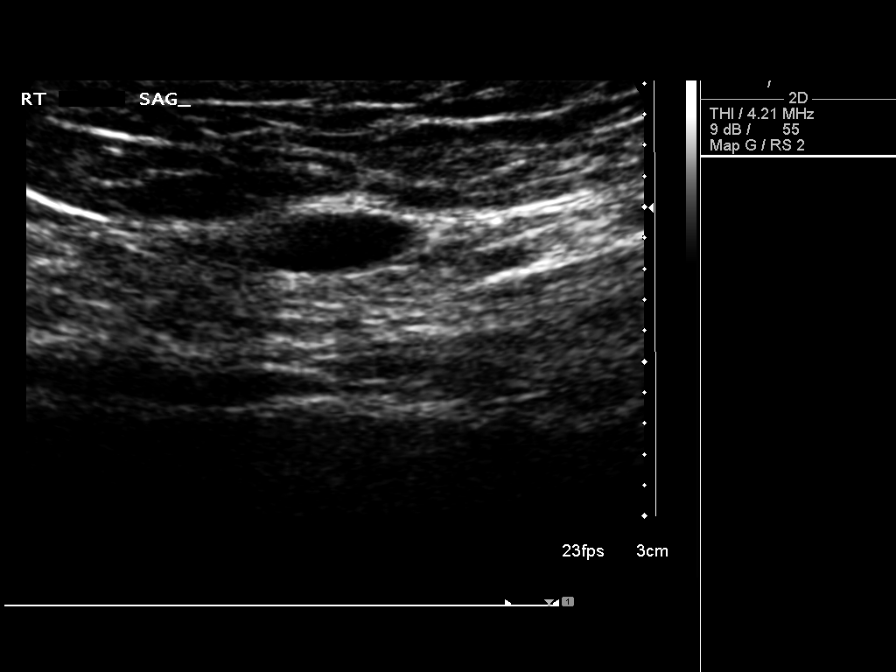
[im 7/9]
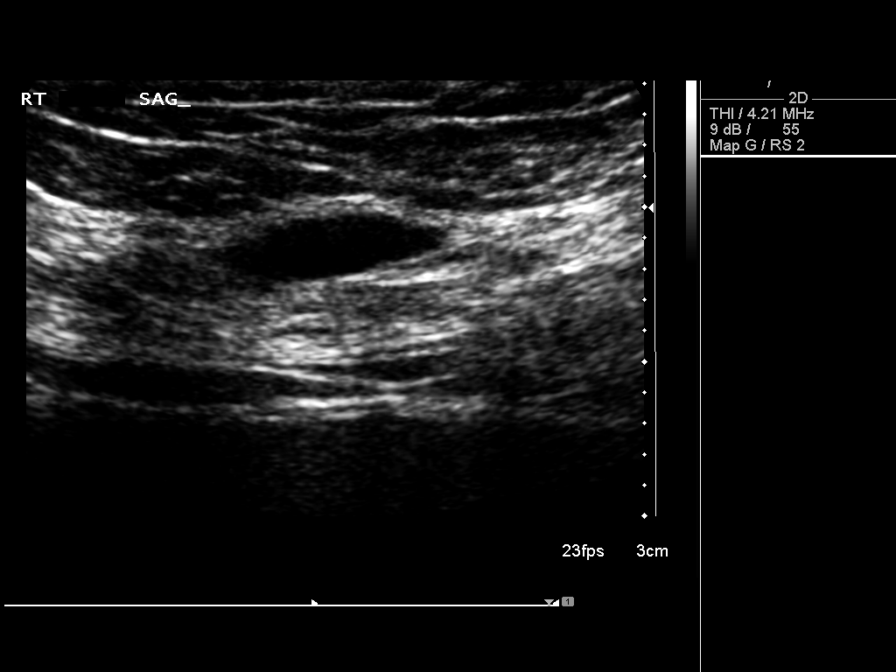
[im 8/9]
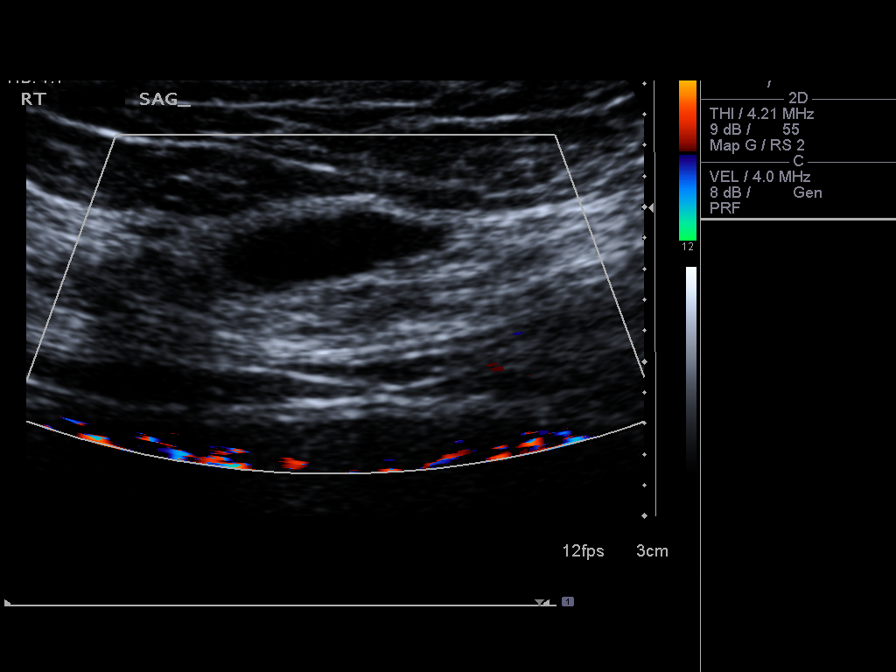
[im 9/9]
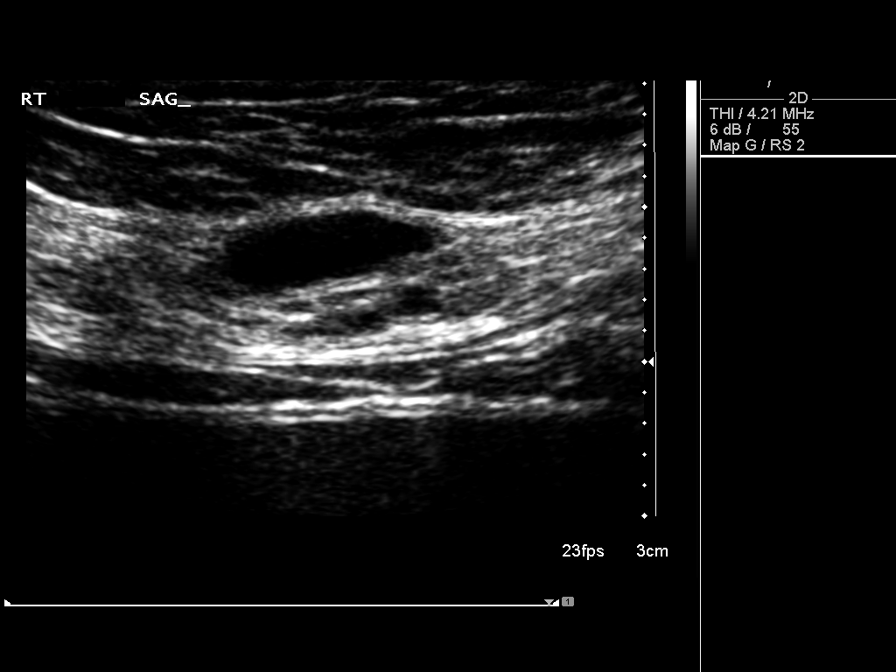

[9 of 9 positions shown; findings below may reference images not displayed]

FINDINGS: Ultrasound was performed in the area of concern within the right
groin. There is a small cystic structure measuring 1.3 x 1.4 x
cm. The wall appears thin. No internal septations or echoes. No
other soft tissue abnormality noted.
IMPRESSION: Small cyst within the right groin measuring up to 1.4 cm of unknown
etiology. Recommend follow-up clinically.

## 2016-10-17 ENCOUNTER — Encounter: Payer: Self-pay | Admitting: Family Medicine

## 2016-10-17 MED ORDER — ALBUTEROL SULFATE HFA 108 (90 BASE) MCG/ACT IN AERS: 2.0000 | INHALATION_SPRAY | RESPIRATORY_TRACT | 0 refills | Status: DC | PRN

## 2016-12-27 ENCOUNTER — Encounter: Payer: Self-pay | Admitting: Family Medicine

## 2016-12-27 ENCOUNTER — Ambulatory Visit (INDEPENDENT_AMBULATORY_CARE_PROVIDER_SITE_OTHER): Payer: Managed Care, Other (non HMO) | Admitting: Family Medicine

## 2016-12-27 VITALS — BP 105/80 | HR 103 | Temp 99.6°F | Resp 16 | Ht 66.0 in | Wt 139.5 lb

## 2016-12-27 DIAGNOSIS — R509 Fever, unspecified: Secondary | ICD-10-CM | POA: Diagnosis not present

## 2016-12-27 LAB — POCT INFLUENZA A: Rapid Influenza A Ag: POSITIVE

## 2016-12-27 MED ORDER — AMOXICILLIN 875 MG PO TABS: 875.0000 mg | ORAL_TABLET | Freq: Two times a day (BID) | ORAL | 0 refills | Status: DC

## 2016-12-27 MED ORDER — OSELTAMIVIR PHOSPHATE 75 MG PO CAPS: 75.0000 mg | ORAL_CAPSULE | Freq: Two times a day (BID) | ORAL | 0 refills | Status: DC

## 2016-12-27 NOTE — Patient Instructions (Signed)
Follow up as needed You have both the flu and a sinus infection Start the Tamiflu twice daily x5 days Start the Amoxicillin twice daily x10 days- take w/ food Drink plenty of fluids REST! Alternate tylenol and ibuprofen as needed for pain/fever Call with any questions or concerns Hang in there!!!

## 2016-12-27 NOTE — Progress Notes (Signed)
Pre visit review using our clinic review tool, if applicable. No additional management support is needed unless otherwise documented below in the visit note. 

## 2016-12-27 NOTE — Progress Notes (Signed)
   Subjective:    Patient ID: Alexandra [name], female    DOB: 18-Apr-1976, 41 y.o.   MRN: [id number]  HPI URI- sxs started Monday w/ fever, cough, nasal congestion.  Has developed severe joint pain over the last few days.  + sick contacts- kids had cough and fever.  + facial pain, tooth pain.  Mild ear pain.  No N/V.     Review of Systems For ROS see HPI     Objective:   Physical Exam  Constitutional: She is oriented to person, place, and time. She appears well-developed and well-nourished. No distress.  HENT:  Head: Normocephalic and atraumatic.  Right Ear: Tympanic membrane normal.  Left Ear: Tympanic membrane normal.  Nose: Mucosal edema and rhinorrhea present. Right sinus exhibits maxillary sinus tenderness and frontal sinus tenderness. Left sinus exhibits maxillary sinus tenderness and frontal sinus tenderness.  Mouth/Throat: Uvula is midline and mucous membranes are normal. Posterior oropharyngeal erythema present. No oropharyngeal exudate.  Eyes: Conjunctivae and EOM are normal. Pupils are equal, round, and reactive to light.  Neck: Normal range of motion. Neck supple.  Cardiovascular: Normal rate, regular rhythm and normal heart sounds.   Pulmonary/Chest: Effort normal and breath sounds normal. No respiratory distress. She has no wheezes.  Lymphadenopathy:    She has no cervical adenopathy.  Neurological: She is alert and oriented to person, place, and time.  Skin: Skin is warm and dry. No rash noted.  Psychiatric: She has a normal mood and affect. Her behavior is normal. Thought content normal.  Vitals reviewed.         Assessment & Plan:  Fever- pt has + flu test in office and sxs also consistent w/ sinus infxn.  Start Tamiflu and Amoxicillin.  Reviewed supportive care and red flags that should prompt return.  Pt expressed understanding and is in agreement w/ plan.

## 2017-04-26 ENCOUNTER — Encounter: Payer: Self-pay | Admitting: Family Medicine

## 2017-07-16 ENCOUNTER — Encounter: Payer: Self-pay | Admitting: Physician Assistant

## 2017-07-16 ENCOUNTER — Ambulatory Visit (INDEPENDENT_AMBULATORY_CARE_PROVIDER_SITE_OTHER): Payer: Managed Care, Other (non HMO) | Admitting: Physician Assistant

## 2017-07-16 VITALS — BP 120/70 | HR 91 | Temp 98.6°F | Ht 66.0 in | Wt 135.4 lb

## 2017-07-16 DIAGNOSIS — J020 Streptococcal pharyngitis: Secondary | ICD-10-CM | POA: Diagnosis not present

## 2017-07-16 DIAGNOSIS — K120 Recurrent oral aphthae: Secondary | ICD-10-CM | POA: Diagnosis not present

## 2017-07-16 LAB — POCT RAPID STREP A (OFFICE): RAPID STREP A SCREEN: POSITIVE — AB

## 2017-07-16 MED ORDER — AMOXICILLIN 875 MG PO TABS: 875.0000 mg | ORAL_TABLET | Freq: Two times a day (BID) | ORAL | 0 refills | Status: DC

## 2017-07-16 NOTE — Patient Instructions (Signed)
It was great to meet you!  Alternate tylenol 500 mg and ibuprofen 400 mg every 4 to 6 hours to help with your pain/inflammation of your throat.  Push fluids.  Start the antibiotic and take with food.

## 2017-07-16 NOTE — Progress Notes (Signed)
Alexandra [name] is a 41 y.o. female here for a new problem.  I acted as a Education administrator for [employer], PA-C Anselmo Pickler, LPN  History of Present Illness:   Chief Complaint  Patient presents with  . Sore Throat    Sore Throat   This is a new problem. Episode onset: Started on Friday. The problem has been unchanged. The pain is worse on the left (hurts worse than right) side. There has been no fever. The pain is at a severity of 5/10. The pain is moderate. Associated symptoms include congestion, coughing, ear pain, headaches, a hoarse voice and swollen glands. Pertinent negatives include no abdominal pain, diarrhea, plugged ear sensation, neck pain, shortness of breath or trouble swallowing. Associated symptoms comments: Coughing and expectorating thick white sputum yesterday.. She has had exposure to strep. Exposure to: Son diagnosed on Oct 1st . She has tried cool liquids, acetaminophen, NSAIDs and gargles (Hot tea with honey) for the symptoms. The treatment provided mild relief.    Past Medical History:  Diagnosis Date  . Abnormal Pap smear    cryo 1999  . Allergy   . Anxiety   . Asthma    also has current cough,last attack <yr  . Family history of malignant neoplasm of gastrointestinal tract   . Gallbladder attack   . Gallstone   . GERD (gastroesophageal reflux disease)   . Hemorrhoids   . Hx: UTI (urinary tract infection)   . Melanoma of lower limb [facility])      Social History   Social History  . Marital status: Married    Spouse name: Alexandra [name]  . Number of children: 2  . Years of education: college   Occupational History  . Photographer     self-employed (weddings)   Social History Main Topics  . Smoking status: Never Smoker  . Smokeless tobacco: Never Used  . Alcohol use No  . Drug use: No  . Sexual activity: Yes    Partners: Male    Birth control/ protection: None     Comment: Husband s/p vasectomy   Other Topics Concern  . Not on file   Social History  Narrative   No caffeine.    Lives with her husband and their two children.   Minimal contact with her mother.   Father lives in [place], [place].   One brother is in [place], [place], and one in [place], [place].    Past Surgical History:  Procedure Laterality Date  . CHOLECYSTECTOMY    . CRYOTHERAPY     cervical  . MELANOMA EXCISION  09/17/2012   Procedure: MELANOMA EXCISION;  Surgeon: Alexandra [name]. Alexandra Dover, MD;  Location: [facility];  Service: General;  Laterality: Left;  wide excision of meanoma-posterior left leg    Family History  Problem Relation Age of Onset  . Hypertension Father   . Diabetes Father   . Hyperlipidemia Father   . Breast cancer Maternal Grandmother   . Heart disease Maternal Grandfather   . Diabetes Paternal Grandfather   . Mental illness Mother        multiple personality disorder  . Depression Mother   . Colon cancer Unknown        Paternal Great Uncle     Allergies  Allergen Reactions  . Sulfamethoxazole-Trimethoprim     REACTION: hives  . Adhesive [Tape] Rash    Steri strips    Current Medications:   Current Outpatient Prescriptions:  .  albuterol (PROAIR HFA) 108 (90 Base) MCG/ACT inhaler,  Inhale 2 puffs into the lungs every 4 (four) hours as needed for wheezing., Disp: 1 Inhaler, Rfl: 0 .  loratadine (CLARITIN) 10 MG tablet, Take 10 mg by mouth daily., Disp: , Rfl:  .  amoxicillin (AMOXIL) 875 MG tablet, Take 1 tablet (875 mg total) by mouth 2 (two) times daily., Disp: 20 tablet, Rfl: 0   Review of Systems:   Review of Systems  HENT: Positive for congestion, ear pain and hoarse voice. Negative for trouble swallowing.   Respiratory: Positive for cough. Negative for shortness of breath.   Gastrointestinal: Negative for abdominal pain and diarrhea.  Musculoskeletal: Negative for neck pain.  Neurological: Positive for headaches.    Vitals:   Vitals:   07/16/17 1041  BP: 120/70  Pulse: 91  Temp: 98.6 F (37 C)  TempSrc: Oral  SpO2: 97%  Weight: 135  lb 6.1 oz (61.4 kg)  Height: 5\' 6"  (1.676 m)     Body mass index is 21.85 kg/m.  Physical Exam:   Physical Exam  Constitutional: She appears well-developed. She is cooperative.  Non-toxic appearance. She does not have a sickly appearance. She does not appear ill. No distress.  HENT:  Head: Normocephalic and atraumatic.  Right Ear: Tympanic membrane, external ear and ear canal normal. Tympanic membrane is not erythematous, not retracted and not bulging.  Left Ear: Tympanic membrane, external ear and ear canal normal. Tympanic membrane is not erythematous, not retracted and not bulging.  Nose: Nose normal. Right sinus exhibits no maxillary sinus tenderness and no frontal sinus tenderness. Left sinus exhibits no maxillary sinus tenderness and no frontal sinus tenderness.  Mouth/Throat: Uvula is midline. No posterior oropharyngeal edema or posterior oropharyngeal erythema. Tonsils are 1+ on the right. Tonsils are 1+ on the left. No tonsillar exudate.  Aphthous ulcer under left side of mid-tongue  Eyes: Conjunctivae and lids are normal.  Neck: Trachea normal.  Cardiovascular: Normal rate, regular rhythm, S1 normal, S2 normal and normal heart sounds.   Pulmonary/Chest: Effort normal and breath sounds normal. She has no decreased breath sounds. She has no wheezes. She has no rhonchi. She has no rales.  Lymphadenopathy:    She has no cervical adenopathy.  Neurological: She is alert.  Skin: Skin is warm, dry and intact.  Psychiatric: She has a normal mood and affect. Her speech is normal and behavior is normal.  Nursing note and vitals reviewed.   Results for orders placed or performed in visit on 07/16/17  POCT rapid strep A  Result Value Ref Range   Rapid Strep A Screen Positive (A) Negative     Assessment and Plan:    [name] was seen today for sore throat.  Diagnoses and all orders for this visit:  Strep throat Rapid strep test is positive. Start amoxicillin per orders.  Recommended alternating tylenol and ibuprofen for her pain/inflammation. Follow-up if symptoms worsen or persist. -     POCT rapid strep A  Aphthous ulcer of tongue May apply orajel prn. Discussed avoiding foods that may worsen pain -- such as spicy, citrus or salty foods.  Other orders -     amoxicillin (AMOXIL) 875 MG tablet; Take 1 tablet (875 mg total) by mouth 2 (two) times daily.    . Reviewed expectations re: course of current medical issues. . Discussed self-management of symptoms. . Outlined signs and symptoms indicating need for more acute intervention. . Patient verbalized understanding and all questions were answered. . See orders for this visit as documented in the  electronic medical record. . Patient received an After-Visit Summary.  CMA or LPN served as scribe during this visit. History, Physical, and Plan performed by medical provider. Documentation and orders reviewed and attested to.  Inda Coke, PA-C

## 2017-08-16 LAB — HM MAMMOGRAPHY: HM Mammogram: NORMAL (ref 0–4)

## 2017-08-22 LAB — HM PAP SMEAR

## 2017-12-20 ENCOUNTER — Other Ambulatory Visit: Payer: Self-pay | Admitting: General Practice

## 2017-12-20 ENCOUNTER — Ambulatory Visit (INDEPENDENT_AMBULATORY_CARE_PROVIDER_SITE_OTHER): Payer: Managed Care, Other (non HMO) | Admitting: Family Medicine

## 2017-12-20 ENCOUNTER — Encounter: Payer: Self-pay | Admitting: Family Medicine

## 2017-12-20 ENCOUNTER — Other Ambulatory Visit: Payer: Self-pay

## 2017-12-20 VITALS — BP 118/74 | HR 103 | Temp 98.2°F | Resp 16 | Ht 66.0 in | Wt 147.0 lb

## 2017-12-20 DIAGNOSIS — Z111 Encounter for screening for respiratory tuberculosis: Secondary | ICD-10-CM

## 2017-12-20 DIAGNOSIS — Z0184 Encounter for antibody response examination: Secondary | ICD-10-CM

## 2017-12-20 DIAGNOSIS — Z0000 Encounter for general adult medical examination without abnormal findings: Secondary | ICD-10-CM

## 2017-12-20 LAB — LIPID PANEL
CHOL/HDL RATIO: 4
Cholesterol: 225 mg/dL — ABNORMAL HIGH (ref 0–200)
HDL: 57.3 mg/dL (ref >39.00)
LDL Cholesterol: 141 mg/dL — ABNORMAL HIGH (ref 0–99)
NonHDL: 167.82
Triglycerides: 132 mg/dL (ref 0.0–149.0)
VLDL: 26.4 mg/dL (ref 0.0–40.0)

## 2017-12-20 LAB — BASIC METABOLIC PANEL
BUN: 15 mg/dL (ref 6–23)
CHLORIDE: 102 meq/L (ref 96–112)
CO2: 28 meq/L (ref 19–32)
CREATININE: 0.63 mg/dL (ref 0.40–1.20)
Calcium: 9.6 mg/dL (ref 8.4–10.5)
GFR: 109.68 mL/min (ref >60.00)
Glucose, Bld: 78 mg/dL (ref 70–99)
Potassium: 4.4 mEq/L (ref 3.5–5.1)
Sodium: 137 mEq/L (ref 135–145)

## 2017-12-20 LAB — TSH: TSH: 1.87 u[IU]/mL (ref 0.35–4.50)

## 2017-12-20 LAB — CBC WITH DIFFERENTIAL/PLATELET
Basophils Absolute: 0.1 10*3/uL (ref 0.0–0.1)
Basophils Relative: 0.7 % (ref 0.0–3.0)
Eosinophils Absolute: 0.1 10*3/uL (ref 0.0–0.7)
Eosinophils Relative: 1.3 % (ref 0.0–5.0)
HCT: 40.4 % (ref 36.0–46.0)
HEMOGLOBIN: 13.5 g/dL (ref 12.0–15.0)
LYMPHS PCT: 31.2 % (ref 12.0–46.0)
Lymphs Abs: 2.8 10*3/uL (ref 0.7–4.0)
MCHC: 33.4 g/dL (ref 30.0–36.0)
MCV: 90.9 fl (ref 78.0–100.0)
MONO ABS: 0.7 10*3/uL (ref 0.1–1.0)
Monocytes Relative: 7.8 % (ref 3.0–12.0)
Neutro Abs: 5.4 10*3/uL (ref 1.4–7.7)
Neutrophils Relative %: 59 % (ref 43.0–77.0)
Platelets: 298 10*3/uL (ref 150.0–400.0)
RBC: 4.44 Mil/uL (ref 3.87–5.11)
RDW: 13.4 % (ref 11.5–15.5)
WBC: 9.1 10*3/uL (ref 4.0–10.5)

## 2017-12-20 LAB — HEPATIC FUNCTION PANEL
ALBUMIN: 4.7 g/dL (ref 3.5–5.2)
ALT: 22 U/L (ref 0–35)
AST: 15 U/L (ref 0–37)
Alkaline Phosphatase: 70 U/L (ref 39–117)
Bilirubin, Direct: 0.1 mg/dL (ref 0.0–0.3)
Total Bilirubin: 0.4 mg/dL (ref 0.2–1.2)
Total Protein: 7.2 g/dL (ref 6.0–8.3)

## 2017-12-20 LAB — VITAMIN D 25 HYDROXY (VIT D DEFICIENCY, FRACTURES): VITD: 16.7 ng/mL — AB (ref 30.00–100.00)

## 2017-12-20 MED ORDER — VITAMIN D (ERGOCALCIFEROL) 1.25 MG (50000 UNIT) PO CAPS: 50000.0000 [IU] | ORAL_CAPSULE | ORAL | 0 refills | Status: DC

## 2017-12-20 NOTE — Assessment & Plan Note (Signed)
Pt's PE WNL.  UTD on GYN, Tdap.  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  Check labs.  Anticipatory guidance provided.

## 2017-12-20 NOTE — Progress Notes (Signed)
   Subjective:    Patient ID: Alexandra [name], female    DOB: 1975-10-31, 42 y.o.   MRN: [id number]  HPI CPE- UTD on pap, mammo.  UTD on Tdap.  Needs titers- MMR, Varicella, Hep B, Quantiferon- for substitute teaching   Review of Systems Patient reports no vision/ hearing changes, adenopathy,fever, weight change,  persistant/recurrent hoarseness , swallowing issues, chest pain, palpitations, edema, persistant/recurrent cough, hemoptysis, dyspnea (rest/exertional/paroxysmal nocturnal), gastrointestinal bleeding (melena, rectal bleeding), abdominal pain, significant heartburn, bowel changes, GU symptoms (dysuria, hematuria, incontinence), Gyn symptoms (abnormal  bleeding, pain),  syncope, focal weakness, memory loss, numbness & tingling, skin/hair/nail changes, abnormal bruising or bleeding, anxiety, or depression.     Objective:   Physical Exam General Appearance:    Alert, cooperative, no distress, appears stated age  Head:    Normocephalic, without obvious abnormality, atraumatic  Eyes:    PERRL, conjunctiva/corneas clear, EOM's intact, fundi    benign, both eyes  Ears:    Normal TM's and external ear canals, both ears  Nose:   Nares normal, septum midline, mucosa normal, no drainage    or sinus tenderness  Throat:   Lips, mucosa, and tongue normal; teeth and gums normal  Neck:   Supple, symmetrical, trachea midline, no adenopathy;    Thyroid: no enlargement/tenderness/nodules  Back:     Symmetric, no curvature, ROM normal, no CVA tenderness  Lungs:     Clear to auscultation bilaterally, respirations unlabored  Chest Wall:    No tenderness or deformity   Heart:    Regular rate and rhythm, S1 and S2 normal, no murmur, rub   or gallop  Breast Exam:    Deferred to GYN  Abdomen:     Soft, non-tender, bowel sounds active all four quadrants,    no masses, no organomegaly  Genitalia:    Deferred to GYN  Rectal:    Extremities:   Extremities normal, atraumatic, no cyanosis or edema    Pulses:   2+ and symmetric all extremities  Skin:   Skin color, texture, turgor normal, no rashes or lesions  Lymph nodes:   Cervical, supraclavicular, and axillary nodes normal  Neurologic:   CNII-XII intact, normal strength, sensation and reflexes    throughout          Assessment & Plan:

## 2017-12-20 NOTE — Patient Instructions (Signed)
Follow up in 1 year or as needed We'll notify you of your lab results and make any changes if needed Continue to work on healthy diet and regular exercise- you can do it! Bring any forms for [place] to fill out- we're happy to fill them out Call with any questions or concerns [place]!!!

## 2017-12-21 LAB — MEASLES/MUMPS/RUBELLA IMMUNITY
Mumps IgG: 83.1 AU/mL
Rubella: 2.54 index
Rubeola IgG: >300 AU/mL

## 2017-12-21 LAB — VARICELLA ZOSTER ANTIBODY, IGG: VARICELLA IGG: 1830 {index}

## 2017-12-21 LAB — HEPATITIS B SURFACE ANTIBODY,QUALITATIVE: HEP B S AB: NONREACTIVE

## 2017-12-22 LAB — QUANTIFERON-TB GOLD PLUS
NIL: 0.05 [IU]/mL
QUANTIFERON-TB GOLD PLUS: NEGATIVE
TB1-NIL: 0 [IU]/mL
TB2-NIL: 0.01 [IU]/mL

## 2017-12-24 ENCOUNTER — Encounter: Payer: Self-pay | Admitting: Family Medicine

## 2017-12-24 ENCOUNTER — Encounter: Payer: Self-pay | Admitting: General Practice

## 2017-12-25 ENCOUNTER — Ambulatory Visit (INDEPENDENT_AMBULATORY_CARE_PROVIDER_SITE_OTHER): Payer: Managed Care, Other (non HMO) | Admitting: *Deleted

## 2017-12-25 DIAGNOSIS — Z23 Encounter for immunization: Secondary | ICD-10-CM

## 2018-01-28 ENCOUNTER — Ambulatory Visit (INDEPENDENT_AMBULATORY_CARE_PROVIDER_SITE_OTHER): Payer: Managed Care, Other (non HMO) | Admitting: *Deleted

## 2018-01-28 DIAGNOSIS — Z23 Encounter for immunization: Secondary | ICD-10-CM | POA: Diagnosis not present

## 2018-05-08 ENCOUNTER — Encounter: Payer: Managed Care, Other (non HMO) | Admitting: Family Medicine

## 2018-06-27 ENCOUNTER — Ambulatory Visit: Payer: Managed Care, Other (non HMO)

## 2018-07-15 ENCOUNTER — Ambulatory Visit (INDEPENDENT_AMBULATORY_CARE_PROVIDER_SITE_OTHER): Payer: 59 | Admitting: Psychology

## 2018-07-15 DIAGNOSIS — F4323 Adjustment disorder with mixed anxiety and depressed mood: Secondary | ICD-10-CM

## 2018-07-16 LAB — HM MAMMOGRAPHY: HM Mammogram: NORMAL (ref 0–4)

## 2018-08-21 ENCOUNTER — Ambulatory Visit (INDEPENDENT_AMBULATORY_CARE_PROVIDER_SITE_OTHER): Payer: 59 | Admitting: Psychology

## 2018-08-21 DIAGNOSIS — F4323 Adjustment disorder with mixed anxiety and depressed mood: Secondary | ICD-10-CM | POA: Diagnosis not present

## 2018-09-02 ENCOUNTER — Ambulatory Visit (INDEPENDENT_AMBULATORY_CARE_PROVIDER_SITE_OTHER): Payer: 59 | Admitting: Psychology

## 2018-09-02 DIAGNOSIS — F4323 Adjustment disorder with mixed anxiety and depressed mood: Secondary | ICD-10-CM | POA: Diagnosis not present

## 2018-09-16 ENCOUNTER — Ambulatory Visit (INDEPENDENT_AMBULATORY_CARE_PROVIDER_SITE_OTHER): Payer: 59 | Admitting: Psychology

## 2018-09-16 DIAGNOSIS — F4323 Adjustment disorder with mixed anxiety and depressed mood: Secondary | ICD-10-CM

## 2018-11-06 ENCOUNTER — Ambulatory Visit (INDEPENDENT_AMBULATORY_CARE_PROVIDER_SITE_OTHER): Payer: 59 | Admitting: Psychology

## 2018-11-06 DIAGNOSIS — F4323 Adjustment disorder with mixed anxiety and depressed mood: Secondary | ICD-10-CM | POA: Diagnosis not present

## 2018-11-27 ENCOUNTER — Ambulatory Visit: Payer: 59 | Admitting: Psychology

## 2019-01-04 ENCOUNTER — Other Ambulatory Visit: Payer: Self-pay | Admitting: Family Medicine

## 2019-01-06 MED ORDER — ALBUTEROL SULFATE HFA 108 (90 BASE) MCG/ACT IN AERS: 2.0000 | INHALATION_SPRAY | RESPIRATORY_TRACT | 0 refills | Status: DC | PRN

## 2019-01-07 ENCOUNTER — Encounter: Payer: Self-pay | Admitting: Family Medicine

## 2019-01-07 ENCOUNTER — Ambulatory Visit: Payer: 59 | Admitting: Psychology

## 2019-01-24 ENCOUNTER — Encounter: Payer: Self-pay | Admitting: Family Medicine

## 2019-04-16 ENCOUNTER — Other Ambulatory Visit: Payer: Self-pay

## 2019-04-16 ENCOUNTER — Encounter: Payer: Self-pay | Admitting: Family Medicine

## 2019-04-16 ENCOUNTER — Ambulatory Visit (INDEPENDENT_AMBULATORY_CARE_PROVIDER_SITE_OTHER): Payer: Managed Care, Other (non HMO) | Admitting: Family Medicine

## 2019-04-16 VITALS — BP 115/74 | HR 71 | Temp 98.0°F | Resp 17 | Ht 66.0 in | Wt 152.2 lb

## 2019-04-16 DIAGNOSIS — N898 Other specified noninflammatory disorders of vagina: Secondary | ICD-10-CM | POA: Diagnosis not present

## 2019-04-16 DIAGNOSIS — E559 Vitamin D deficiency, unspecified: Secondary | ICD-10-CM | POA: Diagnosis not present

## 2019-04-16 DIAGNOSIS — E785 Hyperlipidemia, unspecified: Secondary | ICD-10-CM | POA: Insufficient documentation

## 2019-04-16 DIAGNOSIS — Z0000 Encounter for general adult medical examination without abnormal findings: Secondary | ICD-10-CM | POA: Diagnosis not present

## 2019-04-16 DIAGNOSIS — F339 Major depressive disorder, recurrent, unspecified: Secondary | ICD-10-CM | POA: Diagnosis not present

## 2019-04-16 MED ORDER — BUPROPION HCL 75 MG PO TABS: 75.0000 mg | ORAL_TABLET | Freq: Two times a day (BID) | ORAL | 3 refills | Status: DC

## 2019-04-16 NOTE — Assessment & Plan Note (Signed)
Pt has hx of this.  Check labs and replete prn. 

## 2019-04-16 NOTE — Assessment & Plan Note (Signed)
Pt's PE WNL.  UTD on GYN, immunizations.  Check labs.  Anticipatory guidance provided.  

## 2019-04-16 NOTE — Assessment & Plan Note (Signed)
Chronic problem.  Has been attempting to control w/ diet and exercise.  Check labs.  Determine if medication needed.  Will follow.

## 2019-04-16 NOTE — Patient Instructions (Signed)
Follow up in 4-6 weeks to recheck mood We'll notify you of your lab results and make any changes if needed START the Wellbutrin twice daily Make sure you are using lube for sex Continue to work on healthy diet and regular exercise- this will improve your energy level Call with any questions or concerns Hang in there!

## 2019-04-16 NOTE — Assessment & Plan Note (Signed)
Deteriorated.  Pt has hx of this and was treated previously w/ Prozac and Citalopram.  Neither of which was effective.  Given low libido and low motivation, will start Wellbutrin and monitor for improvement.  Pt expressed understanding and is in agreement w/ plan.

## 2019-04-16 NOTE — Progress Notes (Signed)
   Subjective:    Patient ID: Alexandra [name], female    DOB: 1975/12/13, 43 y.o.   MRN: [id number]  HPI CPE- UTD on pap, mammo, immunizations.   Review of Systems Patient reports no vision/ hearing changes, adenopathy,fever, weight change,  persistant/recurrent hoarseness , swallowing issues, chest pain, palpitations, edema, persistant/recurrent cough, hemoptysis, dyspnea (rest/exertional/paroxysmal nocturnal), gastrointestinal bleeding (melena, rectal bleeding), abdominal pain, significant heartburn, bowel changes, GU symptoms (dysuria, hematuria, incontinence), syncope, focal weakness, memory loss, numbness & tingling, skin/hair/nail changes, abnormal bruising or bleeding.   + depression- PHQ=6.  Pt reports she has very low motivation 'to do anything'.  'I don't know if i'm just bored'.  Decreased libido.  'once i'm going i'm productive'. + vaginal dryness, increased irritability before menses.  Cycles are still regular.    Objective:   Physical Exam General Appearance:    Alert, cooperative, no distress, appears stated age  Head:    Normocephalic, without obvious abnormality, atraumatic  Eyes:    PERRL, conjunctiva/corneas clear, EOM's intact, fundi    benign, both eyes  Ears:    Normal TM's and external ear canals, both ears  Nose:   Deferred due to COVID  Throat:   Neck:   Supple, symmetrical, trachea midline, no adenopathy;    Thyroid: no enlargement/tenderness/nodules  Back:     Symmetric, no curvature, ROM normal, no CVA tenderness  Lungs:     Clear to auscultation bilaterally, respirations unlabored  Chest Wall:    No tenderness or deformity   Heart:    Regular rate and rhythm, S1 and S2 normal, no murmur, rub   or gallop  Breast Exam:    Deferred to GYN  Abdomen:     Soft, non-tender, bowel sounds active all four quadrants,    no masses, no organomegaly  Genitalia:    Deferred to GYN  Rectal:    Extremities:   Extremities normal, atraumatic, no cyanosis or edema   Pulses:   2+ and symmetric all extremities  Skin:   Skin color, texture, turgor normal, no rashes or lesions  Lymph nodes:   Cervical, supraclavicular, and axillary nodes normal  Neurologic:   CNII-XII intact, normal strength, sensation and reflexes    throughout          Assessment & Plan:

## 2019-04-17 LAB — BASIC METABOLIC PANEL
BUN: 10 mg/dL (ref 7–25)
CO2: 23 mmol/L (ref 20–32)
Calcium: 9.3 mg/dL (ref 8.6–10.2)
Chloride: 102 mmol/L (ref 98–110)
Creat: 0.66 mg/dL (ref 0.50–1.10)
Glucose, Bld: 89 mg/dL (ref 65–99)
Potassium: 4 mmol/L (ref 3.5–5.3)
Sodium: 136 mmol/L (ref 135–146)

## 2019-04-17 LAB — LIPID PANEL
Cholesterol: 224 mg/dL — ABNORMAL HIGH (ref <200)
HDL: 52 mg/dL (ref >=50)
LDL Cholesterol (Calc): 152 mg/dL (calc) — ABNORMAL HIGH
Non-HDL Cholesterol (Calc): 172 mg/dL (calc) — ABNORMAL HIGH (ref <130)
Total CHOL/HDL Ratio: 4.3 (calc) (ref <5.0)
Triglycerides: 91 mg/dL (ref <150)

## 2019-04-17 LAB — TSH: TSH: 2.39 mIU/L

## 2019-04-17 LAB — LUTEINIZING HORMONE: LH: 2.8 m[IU]/mL

## 2019-04-17 LAB — CBC WITH DIFFERENTIAL/PLATELET
Absolute Monocytes: 862 cells/uL (ref 200–950)
Basophils Absolute: 67 cells/uL (ref 0–200)
Basophils Relative: 0.6 %
Eosinophils Absolute: 168 cells/uL (ref 15–500)
Eosinophils Relative: 1.5 %
HCT: 40.5 % (ref 35.0–45.0)
Hemoglobin: 13.6 g/dL (ref 11.7–15.5)
Lymphs Abs: 3931 cells/uL — ABNORMAL HIGH (ref 850–3900)
MCH: 29.6 pg (ref 27.0–33.0)
MCHC: 33.6 g/dL (ref 32.0–36.0)
MCV: 88.2 fL (ref 80.0–100.0)
MPV: 11.2 fL (ref 7.5–12.5)
Monocytes Relative: 7.7 %
Neutro Abs: 6171 cells/uL (ref 1500–7800)
Neutrophils Relative %: 55.1 %
Platelets: 312 10*3/uL (ref 140–400)
RBC: 4.59 10*6/uL (ref 3.80–5.10)
RDW: 13 % (ref 11.0–15.0)
Total Lymphocyte: 35.1 %
WBC: 11.2 10*3/uL — ABNORMAL HIGH (ref 3.8–10.8)

## 2019-04-17 LAB — HEPATIC FUNCTION PANEL
AG Ratio: 1.7 (calc) (ref 1.0–2.5)
ALT: 14 U/L (ref 6–29)
AST: 14 U/L (ref 10–30)
Albumin: 4.7 g/dL (ref 3.6–5.1)
Alkaline phosphatase (APISO): 80 U/L (ref 31–125)
Bilirubin, Direct: 0.1 mg/dL (ref 0.0–0.2)
Globulin: 2.7 g/dL (calc) (ref 1.9–3.7)
Indirect Bilirubin: 0.3 mg/dL (calc) (ref 0.2–1.2)
Total Bilirubin: 0.4 mg/dL (ref 0.2–1.2)
Total Protein: 7.4 g/dL (ref 6.1–8.1)

## 2019-04-17 LAB — VITAMIN D 25 HYDROXY (VIT D DEFICIENCY, FRACTURES): Vit D, 25-Hydroxy: 32 ng/mL (ref 30–100)

## 2019-04-17 LAB — FOLLICLE STIMULATING HORMONE: FSH: 3.2 m[IU]/mL

## 2019-05-12 ENCOUNTER — Encounter: Payer: Self-pay | Admitting: Family Medicine

## 2019-05-12 ENCOUNTER — Ambulatory Visit (INDEPENDENT_AMBULATORY_CARE_PROVIDER_SITE_OTHER): Payer: Managed Care, Other (non HMO) | Admitting: Family Medicine

## 2019-05-12 ENCOUNTER — Ambulatory Visit: Payer: Self-pay | Admitting: Family Medicine

## 2019-05-12 ENCOUNTER — Other Ambulatory Visit: Payer: Self-pay

## 2019-05-12 VITALS — BP 112/77 | HR 98 | Temp 97.8°F | Ht 65.0 in | Wt 147.5 lb

## 2019-05-12 DIAGNOSIS — F339 Major depressive disorder, recurrent, unspecified: Secondary | ICD-10-CM

## 2019-05-12 MED ORDER — BUPROPION HCL ER (XL) 150 MG PO TB24: 150.0000 mg | ORAL_TABLET | Freq: Every day | ORAL | 3 refills | Status: DC

## 2019-05-12 NOTE — Progress Notes (Signed)
   Virtual Visit via Video   I connected with patient on 05/12/19 at  4:15 PM EDT by a video enabled telemedicine application and verified that I am speaking with the correct person using two identifiers.  Location patient: Home Location provider: Acupuncturist, Office Persons participating in the virtual visit: Patient, Provider, [place] (Jess B)  I discussed the limitations of evaluation and management by telemedicine and the availability of in person appointments. The patient expressed understanding and agreed to proceed.  Subjective:   HPI:   Mood- pt was started on Bupropion 75mg  BID on 7/8.  Pt feels that meds were helpful but just recently feels like she did prior to starting them.  Mood has again worsened 'just in the past week'.  Semester starts Wednesday.  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  Again has low motivation, low energy.  At first had some insomnia.  Medication has decreased appetite.  ROS:   See pertinent positives and negatives per HPI.  Patient Active Problem List   Diagnosis Date Noted  . Hyperlipidemia 04/16/2019  . Vitamin D deficiency 04/16/2019  . Depression, recurrent ([place]) 04/16/2019  . GERD (gastroesophageal reflux disease) 05/14/2014  . History of melanoma 08/29/2012  . Physical exam 05/27/2012  . Family history of malignant neoplasm of gastrointestinal tract 10/24/2011  . ASTHMA 12/18/2008    Social History   Tobacco Use  . Smoking status: Never Smoker  . Smokeless tobacco: Never Used  Substance Use Topics  . Alcohol use: No    Alcohol/week: 0.0 standard drinks    Current Outpatient Medications:  .  albuterol (PROAIR HFA) 108 (90 Base) MCG/ACT inhaler, Inhale 2 puffs into the lungs every 4 (four) hours as needed for wheezing., Disp: 1 Inhaler, Rfl: 0 .  buPROPion (WELLBUTRIN) 75 MG tablet, Take 1 tablet (75 mg total) by mouth 2 (two) times daily., Disp: 60 tablet, Rfl: 3 .  loratadine (CLARITIN) 10 MG tablet, Take 10 mg by mouth  daily., Disp: , Rfl:   Allergies  Allergen Reactions  . Sulfamethoxazole-Trimethoprim     REACTION: hives  . Adhesive [Tape] Rash    Steri strips    Objective:   BP 112/77   Pulse 98   Temp 97.8 F (36.6 C) (Tympanic)   Ht 5\' 5"  (1.651 m)   Wt 147 lb 8 oz (66.9 kg)   BMI 24.55 kg/m  AAOx3, NAD NCAT, EOMI No obvious CN deficits Coloring WNL Pt is able to speak clearly, coherently without shortness of breath or increased work of breathing.  Thought process is linear.  Mood is appropriate.   Assessment and Plan:   Depression- ongoing.  Pt initially felt she improved w/ addition of Wellbutrin but has since back slid.  Will switch to Wellbutrin XL 150mg  daily as her current sxs are mostly lack of energy and low motivation.  Pt expressed understanding and is in agreement w/ plan.    Annye Asa, MD 05/12/2019

## 2019-05-12 NOTE — Progress Notes (Signed)
I have discussed the procedure for the virtual visit with the patient who has given consent to proceed with assessment and treatment.   Pt states that in the last week she feels as though the medication is not working.   Davis Gourd, CMA

## 2019-05-27 ENCOUNTER — Encounter: Payer: Self-pay | Admitting: Family Medicine

## 2019-06-11 ENCOUNTER — Ambulatory Visit: Payer: Managed Care, Other (non HMO) | Admitting: Family Medicine

## 2019-06-24 ENCOUNTER — Ambulatory Visit (INDEPENDENT_AMBULATORY_CARE_PROVIDER_SITE_OTHER): Payer: 59 | Admitting: Psychology

## 2019-06-24 DIAGNOSIS — F321 Major depressive disorder, single episode, moderate: Secondary | ICD-10-CM | POA: Diagnosis not present

## 2019-07-02 ENCOUNTER — Ambulatory Visit (INDEPENDENT_AMBULATORY_CARE_PROVIDER_SITE_OTHER): Payer: 59 | Admitting: Psychology

## 2019-07-02 DIAGNOSIS — F321 Major depressive disorder, single episode, moderate: Secondary | ICD-10-CM | POA: Diagnosis not present

## 2019-07-16 ENCOUNTER — Encounter: Payer: Self-pay | Admitting: Family Medicine

## 2019-07-16 ENCOUNTER — Ambulatory Visit (INDEPENDENT_AMBULATORY_CARE_PROVIDER_SITE_OTHER): Payer: 59 | Admitting: Psychology

## 2019-07-16 DIAGNOSIS — F321 Major depressive disorder, single episode, moderate: Secondary | ICD-10-CM | POA: Diagnosis not present

## 2019-07-17 ENCOUNTER — Ambulatory Visit (INDEPENDENT_AMBULATORY_CARE_PROVIDER_SITE_OTHER): Payer: Managed Care, Other (non HMO) | Admitting: Family Medicine

## 2019-07-17 ENCOUNTER — Other Ambulatory Visit: Payer: Self-pay

## 2019-07-17 ENCOUNTER — Encounter: Payer: Self-pay | Admitting: Family Medicine

## 2019-07-17 VITALS — BP 97/66 | HR 75 | Temp 97.5°F | Ht 65.0 in | Wt 145.0 lb

## 2019-07-17 DIAGNOSIS — F339 Major depressive disorder, recurrent, unspecified: Secondary | ICD-10-CM

## 2019-07-17 NOTE — Progress Notes (Signed)
Virtual Visit via Video   I connected with patient on 07/17/19 at  8:30 AM EDT by a video enabled telemedicine application and verified that I am speaking with the correct person using two identifiers.  Location patient: Home Location provider: Acupuncturist, Office Persons participating in the virtual visit: Patient, Provider, [place] (Jess B)  I discussed the limitations of evaluation and management by telemedicine and the availability of in person appointments. The patient expressed understanding and agreed to proceed.  Subjective:   HPI:   Depression- 'i'm doing ok'.  Overwhelmed w/ school- both w/ the kids and her school.  Finds that it takes longer to read and comprehend.  Difficulty w/ concentration.  Increased distractibility.  Spoke w/ advisor at school after getting Ds on her first exams.  Stopped Wellbutrin due to HAs and 'brain fog'.  Since stopping medicine, 'my moods are crazy'.  Is currently on counseling, walking regularly, using light box.  Previously on Fluoxetine which caused excessive sleepiness.  Pt is leery about starting meds in the middle of the semester due to possible side effects.  Pt is hoping to get some accommodations at school (extra time for assignments and tests) due to her ongoing depression and the difficulty w/ home schooling the kids this year.  ROS:   See pertinent positives and negatives per HPI.  Patient Active Problem List   Diagnosis Date Noted  . Hyperlipidemia 04/16/2019  . Vitamin D deficiency 04/16/2019  . Depression, recurrent ([place]) 04/16/2019  . GERD (gastroesophageal reflux disease) 05/14/2014  . History of melanoma 08/29/2012  . Physical exam 05/27/2012  . Family history of malignant neoplasm of gastrointestinal tract 10/24/2011  . ASTHMA 12/18/2008    Social History   Tobacco Use  . Smoking status: Never Smoker  . Smokeless tobacco: Never Used  Substance Use Topics  . Alcohol use: No    Alcohol/week: 0.0 standard drinks     Current Outpatient Medications:  .  albuterol (PROAIR HFA) 108 (90 Base) MCG/ACT inhaler, Inhale 2 puffs into the lungs every 4 (four) hours as needed for wheezing., Disp: 1 Inhaler, Rfl: 0 .  loratadine (CLARITIN) 10 MG tablet, Take 10 mg by mouth daily., Disp: , Rfl:  .  metroNIDAZOLE (METROCREAM) 0.75 % cream, APPLY A SMALL AMOUNT TO SKIN  TWICE A DAY ON FACE, Disp: , Rfl:  .  buPROPion (WELLBUTRIN) 75 MG tablet, , Disp: , Rfl:   Allergies  Allergen Reactions  . Sulfamethoxazole-Trimethoprim     REACTION: hives  . Adhesive [Tape] Rash    Steri strips    Objective:   BP 97/66   Pulse 75   Temp (!) 97.5 F (36.4 C) (Tympanic)   Ht 5\' 5"  (1.651 m)   Wt 145 lb (65.8 kg)   BMI 24.13 kg/m   AAOx3, NAD NCAT, EOMI No obvious CN deficits Coloring WNL Pt is able to speak clearly, coherently without shortness of breath or increased work of breathing.  Thought process is linear.  Mood is appropriate.   Assessment and Plan:   Depression- recurrent problem for pt, currently worse than before.  Has increased stress due to COVID and home schooling her children while attempting to do her own school work.  She was not able to tolerate medication due to HAs and 'brain fog' and is now worried about possible side effects if we were to start a medication in the middle of the semester.  She is open to trying medication once school is on break in  Dec and Jan.  She is currently in counseling- which I applaud.  She is hoping that she will be able to work with her school for accommodations for increased test and assignment time due to her difficulty w/ focus and task completion.  Will attach a letter to today's office visit.  Annye Asa, MD 07/17/2019

## 2019-07-17 NOTE — Progress Notes (Signed)
I have discussed the procedure for the virtual visit with the patient who has given consent to proceed with assessment and treatment.   Suhaan Perleberg L Mahlik Lenn, CMA     

## 2019-07-30 ENCOUNTER — Ambulatory Visit (INDEPENDENT_AMBULATORY_CARE_PROVIDER_SITE_OTHER): Payer: 59 | Admitting: Psychology

## 2019-07-30 DIAGNOSIS — F321 Major depressive disorder, single episode, moderate: Secondary | ICD-10-CM | POA: Diagnosis not present

## 2019-08-28 ENCOUNTER — Ambulatory Visit (INDEPENDENT_AMBULATORY_CARE_PROVIDER_SITE_OTHER): Payer: 59 | Admitting: Psychology

## 2019-08-28 DIAGNOSIS — F321 Major depressive disorder, single episode, moderate: Secondary | ICD-10-CM

## 2019-09-22 LAB — HM MAMMOGRAPHY

## 2019-09-23 ENCOUNTER — Ambulatory Visit (INDEPENDENT_AMBULATORY_CARE_PROVIDER_SITE_OTHER): Payer: 59 | Admitting: Psychology

## 2019-09-23 DIAGNOSIS — F4323 Adjustment disorder with mixed anxiety and depressed mood: Secondary | ICD-10-CM | POA: Diagnosis not present

## 2019-10-07 ENCOUNTER — Encounter: Payer: Self-pay | Admitting: Family Medicine

## 2019-10-07 ENCOUNTER — Telehealth (INDEPENDENT_AMBULATORY_CARE_PROVIDER_SITE_OTHER): Payer: Managed Care, Other (non HMO) | Admitting: Family Medicine

## 2019-10-07 DIAGNOSIS — H6981 Other specified disorders of Eustachian tube, right ear: Secondary | ICD-10-CM

## 2019-10-07 DIAGNOSIS — H9201 Otalgia, right ear: Secondary | ICD-10-CM

## 2019-10-07 NOTE — Progress Notes (Signed)
Virtual Visit via Video Note   I connected with Alexandra Squyres on 10/07/19 by a video enabled telemedicine application and verified that I am speaking with the correct person using two identifiers.  Location patient: home Location provider:work office Persons participating in the virtual visit: patient, provider  I discussed the limitations of evaluation and management by telemedicine and the availability of in person appointments. The patient expressed understanding and agreed to proceed.  Chief Complaint  Patient presents with  . Ear Pain  . Sore Throat    HPI: Alexandra [name] is a 43 yo female with hx of asthma,allergies, and GERD who is c/o 2 days of right earache and right-sided sore throat (right side of neck).  Pain is constant.7-8/10. She has not identified exacerbating or alleviating factors. No recent URI. Stuffy nose a few weeks ago attributed to allergies.  Hx of allergic rhinitis and asthma.  Negative for fever,chills,unusual fatigue,body aches,facial pain or edema,dysphagia,stridor,cough,wheezing,SOB,N/V,or skin rash. Sneezing occasionally. No changes in smell or taste. No sick contact.  + Tinnitus,which she has had before. No hearing changes or ear drainage. Problem is stable. She has not tried OTC medications. + Hot tea.   ROS: See pertinent positives and negatives per HPI.  Past Medical History:  Diagnosis Date  . Abnormal Pap smear    cryo 1999  . Allergy   . Anxiety   . Asthma    also has current cough,last attack <yr  . Family history of malignant neoplasm of gastrointestinal tract   . Gallbladder attack   . Gallstone   . GERD (gastroesophageal reflux disease)   . Hemorrhoids   . Hx: UTI (urinary tract infection)   . Melanoma of lower limb [facility])     Past Surgical History:  Procedure Laterality Date  . CHOLECYSTECTOMY    . CRYOTHERAPY     cervical  . MELANOMA EXCISION  09/17/2012   Procedure: MELANOMA EXCISION;  Surgeon: Imogene Burn. Georgette Dover, MD;   Location: [facility] OR;  Service: General;  Laterality: Left;  wide excision of meanoma-posterior left leg    Family History  Problem Relation Age of Onset  . Hypertension Father   . Diabetes Father   . Hyperlipidemia Father   . Breast cancer Maternal Grandmother   . Heart disease Maternal Grandfather   . Diabetes Paternal Grandfather   . Mental illness Mother        multiple personality disorder  . Depression Mother   . Colon cancer Other        Paternal Great Uncle     Social History   Socioeconomic History  . Marital status: Married    Spouse name: Rodman Key  . Number of children: 2  . Years of education: college  . Highest education level: Not on file  Occupational History  . 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 Smoking status: Never Smoker  . Smokeless tobacco: Never Used  Substance and Sexual Activity  . Alcohol use: No    Alcohol/week: 0.0 standard drinks  . Drug use: No  . Sexual activity: Yes    Partners: Male    Birth control/protection: None    Comment: Husband s/p vasectomy  Other Topics Concern  . Not on file  Social History Narrative   No caffeine.    Lives with her husband and their two children.   Minimal contact with her mother.   Father lives in [place], [place].   One brother is in [place], [place], and one in  [place], [place].   Social Determinants of Health   Financial Resource Strain:   . Difficulty of Paying Living Expenses: Not on file  Food Insecurity:   . Worried About Charity fundraiser in the Last Year: Not on file  . Ran Out of Food in the Last Year: Not on file  Transportation Needs:   . Lack of Transportation (Medical): Not on file  . Lack of Transportation (Non-Medical): Not on file  Physical Activity:   . Days of Exercise per Week: Not on file  . Minutes of Exercise per Session: Not on file  Stress:   . Feeling of Stress : Not on file  Social Connections:   . Frequency of Communication with Friends  and Family: Not on file  . Frequency of Social Gatherings with Friends and Family: Not on file  . Attends Religious Services: Not on file  . Active Member of Clubs or Organizations: Not on file  . Attends Archivist Meetings: Not on file  . Marital Status: Not on file  Intimate Partner Violence:   . Fear of Current or Ex-Partner: Not on file  . Emotionally Abused: Not on file  . Physically Abused: Not on file  . Sexually Abused: Not on file    Current Outpatient Medications:  .  albuterol (PROAIR HFA) 108 (90 Base) MCG/ACT inhaler, Inhale 2 puffs into the lungs every 4 (four) hours as needed for wheezing., Disp: 1 Inhaler, Rfl: 0 .  loratadine (CLARITIN) 10 MG tablet, Take 10 mg by mouth daily., Disp: , Rfl:  .  metroNIDAZOLE (METROCREAM) 0.75 % cream, APPLY A SMALL AMOUNT TO SKIN  TWICE A DAY ON FACE, Disp: , Rfl:   EXAM:  VITALS per patient if applicable:N/A  GENERAL: alert, oriented, appears well and in no acute distress  HEENT: atraumatic, conjunttiva clear, no obvious abnormalities on inspection of external nose and ears With auto inflation maneuvers she cannot open right eustachian tube, it does not elicit pain. No pain when she presses tragus or pull hellix (auricula). Inspection on video I do not appreciate pharyngeal erythema ,edema,or exudate. Uvula is centered.  NECK: normal movements of the head and neck When palpating her neck,she does not feel enlarged glands.  LUNGS: on inspection no signs of respiratory distress, breathing rate appears normal, no obvious gross SOB, gasping or wheezing  CV: no obvious cyanosis  PSYCH/NEURO: pleasant and cooperative, no obvious depression or anxiety, speech and thought processing grossly intact  ASSESSMENT AND PLAN:  Discussed the following assessment and plan:  Earache on right We discussed possible etiologies. Explained that even if TM is erythematous (OM), given the fact problem has been going for 2 days,  recommendations are not to start antibiotics at this time. Sore throat seems to be referred pain.  Monitor for new symptoms. Instructed about warning signs.  If ear ache is still present in 4 to 5 days or if pain gets worse, she was instructed to let me know in which case oral antibiotic will be considered.  Dysfunction of right eustachian tube Allergies can be a contributing factor. OTC decongestants may help. Nasal irrigations with saline prn and Flonase daily for a couple weeks. Auto inflation maneuvers a few times through the day.    I discussed the assessment and treatment plan with the patient. She was provided an opportunity to ask questions and all were answered. She agreed with the plan and demonstrated an understanding of the instructions.   The patient was advised to  call back or seek an in-person evaluation if the symptoms worsen or if the condition fails to improve as anticipated.  Return if symptoms worsen or fail to improve.    Alexandra Andrzejewski [place], MD

## 2019-10-10 HISTORY — PX: BASAL CELL CARCINOMA EXCISION: SHX1214

## 2019-11-19 DIAGNOSIS — M9903 Segmental and somatic dysfunction of lumbar region: Secondary | ICD-10-CM | POA: Diagnosis not present

## 2019-11-19 DIAGNOSIS — M9902 Segmental and somatic dysfunction of thoracic region: Secondary | ICD-10-CM | POA: Diagnosis not present

## 2019-11-19 DIAGNOSIS — M9908 Segmental and somatic dysfunction of rib cage: Secondary | ICD-10-CM | POA: Diagnosis not present

## 2019-11-19 DIAGNOSIS — M5386 Other specified dorsopathies, lumbar region: Secondary | ICD-10-CM | POA: Diagnosis not present

## 2019-12-22 DIAGNOSIS — M9908 Segmental and somatic dysfunction of rib cage: Secondary | ICD-10-CM | POA: Diagnosis not present

## 2019-12-22 DIAGNOSIS — M9902 Segmental and somatic dysfunction of thoracic region: Secondary | ICD-10-CM | POA: Diagnosis not present

## 2019-12-22 DIAGNOSIS — M9903 Segmental and somatic dysfunction of lumbar region: Secondary | ICD-10-CM | POA: Diagnosis not present

## 2019-12-22 DIAGNOSIS — M5386 Other specified dorsopathies, lumbar region: Secondary | ICD-10-CM | POA: Diagnosis not present

## 2019-12-23 DIAGNOSIS — C44311 Basal cell carcinoma of skin of nose: Secondary | ICD-10-CM | POA: Diagnosis not present

## 2020-02-16 DIAGNOSIS — M9902 Segmental and somatic dysfunction of thoracic region: Secondary | ICD-10-CM | POA: Diagnosis not present

## 2020-02-16 DIAGNOSIS — M9908 Segmental and somatic dysfunction of rib cage: Secondary | ICD-10-CM | POA: Diagnosis not present

## 2020-02-16 DIAGNOSIS — M5386 Other specified dorsopathies, lumbar region: Secondary | ICD-10-CM | POA: Diagnosis not present

## 2020-02-16 DIAGNOSIS — M9903 Segmental and somatic dysfunction of lumbar region: Secondary | ICD-10-CM | POA: Diagnosis not present

## 2020-03-15 DIAGNOSIS — M9902 Segmental and somatic dysfunction of thoracic region: Secondary | ICD-10-CM | POA: Diagnosis not present

## 2020-03-15 DIAGNOSIS — M9903 Segmental and somatic dysfunction of lumbar region: Secondary | ICD-10-CM | POA: Diagnosis not present

## 2020-03-15 DIAGNOSIS — M5386 Other specified dorsopathies, lumbar region: Secondary | ICD-10-CM | POA: Diagnosis not present

## 2020-03-15 DIAGNOSIS — M9908 Segmental and somatic dysfunction of rib cage: Secondary | ICD-10-CM | POA: Diagnosis not present

## 2020-04-03 ENCOUNTER — Encounter: Payer: Self-pay | Admitting: Family Medicine

## 2020-04-03 DIAGNOSIS — R0683 Snoring: Secondary | ICD-10-CM

## 2020-04-05 DIAGNOSIS — M5386 Other specified dorsopathies, lumbar region: Secondary | ICD-10-CM | POA: Diagnosis not present

## 2020-04-05 DIAGNOSIS — M9908 Segmental and somatic dysfunction of rib cage: Secondary | ICD-10-CM | POA: Diagnosis not present

## 2020-04-05 DIAGNOSIS — M9903 Segmental and somatic dysfunction of lumbar region: Secondary | ICD-10-CM | POA: Diagnosis not present

## 2020-04-05 DIAGNOSIS — M9902 Segmental and somatic dysfunction of thoracic region: Secondary | ICD-10-CM | POA: Diagnosis not present

## 2020-04-19 ENCOUNTER — Other Ambulatory Visit: Payer: Self-pay

## 2020-04-19 ENCOUNTER — Ambulatory Visit (INDEPENDENT_AMBULATORY_CARE_PROVIDER_SITE_OTHER): Payer: BC Managed Care – PPO | Admitting: Family Medicine

## 2020-04-19 ENCOUNTER — Encounter: Payer: Self-pay | Admitting: Family Medicine

## 2020-04-19 VITALS — BP 101/74 | HR 85 | Temp 97.9°F | Resp 16 | Ht 65.0 in | Wt 156.4 lb

## 2020-04-19 DIAGNOSIS — F339 Major depressive disorder, recurrent, unspecified: Secondary | ICD-10-CM | POA: Diagnosis not present

## 2020-04-19 DIAGNOSIS — E559 Vitamin D deficiency, unspecified: Secondary | ICD-10-CM | POA: Diagnosis not present

## 2020-04-19 DIAGNOSIS — Z0000 Encounter for general adult medical examination without abnormal findings: Secondary | ICD-10-CM | POA: Diagnosis not present

## 2020-04-19 DIAGNOSIS — E785 Hyperlipidemia, unspecified: Secondary | ICD-10-CM

## 2020-04-19 LAB — HEPATIC FUNCTION PANEL
ALT: 18 U/L (ref 0–35)
AST: 19 U/L (ref 0–37)
Albumin: 4.8 g/dL (ref 3.5–5.2)
Alkaline Phosphatase: 78 U/L (ref 39–117)
Bilirubin, Direct: 0.1 mg/dL (ref 0.0–0.3)
Total Bilirubin: 0.4 mg/dL (ref 0.2–1.2)
Total Protein: 7.4 g/dL (ref 6.0–8.3)

## 2020-04-19 LAB — CBC WITH DIFFERENTIAL/PLATELET
Basophils Absolute: 0.1 10*3/uL (ref 0.0–0.1)
Basophils Relative: 0.9 % (ref 0.0–3.0)
Eosinophils Absolute: 0.2 10*3/uL (ref 0.0–0.7)
Eosinophils Relative: 2.1 % (ref 0.0–5.0)
HCT: 41.1 % (ref 36.0–46.0)
Hemoglobin: 13.8 g/dL (ref 12.0–15.0)
Lymphocytes Relative: 36.8 % (ref 12.0–46.0)
Lymphs Abs: 3 10*3/uL (ref 0.7–4.0)
MCHC: 33.5 g/dL (ref 30.0–36.0)
MCV: 89.8 fl (ref 78.0–100.0)
Monocytes Absolute: 0.6 10*3/uL (ref 0.1–1.0)
Monocytes Relative: 7.8 % (ref 3.0–12.0)
Neutro Abs: 4.3 10*3/uL (ref 1.4–7.7)
Neutrophils Relative %: 52.4 % (ref 43.0–77.0)
Platelets: 300 10*3/uL (ref 150.0–400.0)
RBC: 4.58 Mil/uL (ref 3.87–5.11)
RDW: 13.6 % (ref 11.5–15.5)
WBC: 8.1 10*3/uL (ref 4.0–10.5)

## 2020-04-19 LAB — BASIC METABOLIC PANEL
BUN: 12 mg/dL (ref 6–23)
CO2: 28 mEq/L (ref 19–32)
Calcium: 9.1 mg/dL (ref 8.4–10.5)
Chloride: 100 mEq/L (ref 96–112)
Creatinine, Ser: 0.64 mg/dL (ref 0.40–1.20)
GFR: 100.26 mL/min (ref >60.00)
Glucose, Bld: 86 mg/dL (ref 70–99)
Potassium: 4.3 mEq/L (ref 3.5–5.1)
Sodium: 136 mEq/L (ref 135–145)

## 2020-04-19 LAB — LIPID PANEL
Cholesterol: 195 mg/dL (ref 0–200)
HDL: 51.2 mg/dL (ref >39.00)
LDL Cholesterol: 108 mg/dL — ABNORMAL HIGH (ref 0–99)
NonHDL: 143.68
Total CHOL/HDL Ratio: 4
Triglycerides: 176 mg/dL — ABNORMAL HIGH (ref 0.0–149.0)
VLDL: 35.2 mg/dL (ref 0.0–40.0)

## 2020-04-19 LAB — TSH: TSH: 2.03 u[IU]/mL (ref 0.35–4.50)

## 2020-04-19 LAB — VITAMIN D 25 HYDROXY (VIT D DEFICIENCY, FRACTURES): VITD: 35.17 ng/mL (ref 30.00–100.00)

## 2020-04-19 NOTE — Assessment & Plan Note (Addendum)
Pt reports this is much better than at last visit.  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

## 2020-04-19 NOTE — Assessment & Plan Note (Signed)
Pt started Red Yeast Rice and Co-Q10.  Check labs.  Adjust meds prn

## 2020-04-19 NOTE — Patient Instructions (Signed)
Follow up in 1 year or as needed We'll notify you of your lab results and make any changes if needed Continue to work on healthy diet and regular exercise- you can do it! Please have GYN send me a copy of pap and mammo Call with any questions or concerns Have a great summer!!!

## 2020-04-19 NOTE — Assessment & Plan Note (Signed)
Pt is on daily supplement.  Check labs and replete prn.

## 2020-04-19 NOTE — Progress Notes (Signed)
   Subjective:    Patient ID: Alexandra [name], female    DOB: 13-Jun-1976, 44 y.o.   MRN: [id number]  HPI CPE- UTD on mammo, pap, immunizations (including COVID).  Pt has gained 11 lbs since last visit.  Started Red Yeast Rice after last visit w/ Co-Q10 and Vit D.   Review of Systems Patient reports no vision/ hearing changes, adenopathy,fever, persistant/recurrent hoarseness , swallowing issues, chest pain, palpitations, edema, persistant/recurrent cough, hemoptysis, dyspnea (rest/exertional/paroxysmal nocturnal), gastrointestinal bleeding (melena, rectal bleeding), abdominal pain, significant heartburn, bowel changes, GU symptoms (dysuria, hematuria, incontinence), Gyn symptoms (abnormal  bleeding, pain),  syncope, focal weakness, memory loss, numbness & tingling, skin/hair/nail changes, abnormal bruising or bleeding, anxiety, or depression.   This visit occurred during the SARS-CoV-2 public health emergency.  Safety protocols were in place, including screening questions prior to the visit, additional usage of staff PPE, and extensive cleaning of exam room while observing appropriate contact time as indicated for disinfecting solutions.       Objective:   Physical Exam General Appearance:    Alert, cooperative, no distress, appears stated age  Head:    Normocephalic, without obvious abnormality, atraumatic  Eyes:    PERRL, conjunctiva/corneas clear, EOM's intact, fundi    benign, both eyes  Ears:    Normal TM's and external ear canals, both ears  Nose:   Deferred due to COVID  Throat:   Neck:   Supple, symmetrical, trachea midline, no adenopathy;    Thyroid: no enlargement/tenderness/nodules  Back:     Symmetric, no curvature, ROM normal, no CVA tenderness  Lungs:     Clear to auscultation bilaterally, respirations unlabored  Chest Wall:    No tenderness or deformity   Heart:    Regular rate and rhythm, S1 and S2 normal, no murmur, rub   or gallop  Breast Exam:    Deferred to GYN    Abdomen:     Soft, non-tender, bowel sounds active all four quadrants,    no masses, no organomegaly  Genitalia:    Deferred to GYN  Rectal:    Extremities:   Extremities normal, atraumatic, no cyanosis or edema  Pulses:   2+ and symmetric all extremities  Skin:   Skin color, texture, turgor normal, no rashes or lesions  Lymph nodes:   Cervical, supraclavicular, and axillary nodes normal  Neurologic:   CNII-XII intact, normal strength, sensation and reflexes    throughout          Assessment & Plan:

## 2020-04-19 NOTE — Assessment & Plan Note (Signed)
Pt's PE WNL.  UTD on pap, mammo, immunizations.  Check labs.  Anticipatory guidance provided.

## 2020-04-20 ENCOUNTER — Encounter: Payer: Self-pay | Admitting: General Practice

## 2020-04-26 DIAGNOSIS — M9908 Segmental and somatic dysfunction of rib cage: Secondary | ICD-10-CM | POA: Diagnosis not present

## 2020-04-26 DIAGNOSIS — M9902 Segmental and somatic dysfunction of thoracic region: Secondary | ICD-10-CM | POA: Diagnosis not present

## 2020-04-26 DIAGNOSIS — M9903 Segmental and somatic dysfunction of lumbar region: Secondary | ICD-10-CM | POA: Diagnosis not present

## 2020-04-26 DIAGNOSIS — M5386 Other specified dorsopathies, lumbar region: Secondary | ICD-10-CM | POA: Diagnosis not present

## 2020-05-26 ENCOUNTER — Institutional Professional Consult (permissible substitution): Payer: Managed Care, Other (non HMO) | Admitting: Pulmonary Disease

## 2020-05-28 ENCOUNTER — Encounter: Payer: Self-pay | Admitting: Family Medicine

## 2020-06-15 DIAGNOSIS — M9908 Segmental and somatic dysfunction of rib cage: Secondary | ICD-10-CM | POA: Diagnosis not present

## 2020-06-15 DIAGNOSIS — M5386 Other specified dorsopathies, lumbar region: Secondary | ICD-10-CM | POA: Diagnosis not present

## 2020-06-15 DIAGNOSIS — M9903 Segmental and somatic dysfunction of lumbar region: Secondary | ICD-10-CM | POA: Diagnosis not present

## 2020-06-15 DIAGNOSIS — M9902 Segmental and somatic dysfunction of thoracic region: Secondary | ICD-10-CM | POA: Diagnosis not present

## 2020-07-01 DIAGNOSIS — Z20822 Contact with and (suspected) exposure to covid-19: Secondary | ICD-10-CM | POA: Diagnosis not present

## 2020-07-05 DIAGNOSIS — L905 Scar conditions and fibrosis of skin: Secondary | ICD-10-CM | POA: Diagnosis not present

## 2020-07-05 DIAGNOSIS — Z85820 Personal history of malignant melanoma of skin: Secondary | ICD-10-CM | POA: Diagnosis not present

## 2020-07-05 DIAGNOSIS — L814 Other melanin hyperpigmentation: Secondary | ICD-10-CM | POA: Diagnosis not present

## 2020-07-05 DIAGNOSIS — D225 Melanocytic nevi of trunk: Secondary | ICD-10-CM | POA: Diagnosis not present

## 2020-07-13 ENCOUNTER — Ambulatory Visit (INDEPENDENT_AMBULATORY_CARE_PROVIDER_SITE_OTHER): Payer: BC Managed Care – PPO | Admitting: Pulmonary Disease

## 2020-07-13 ENCOUNTER — Other Ambulatory Visit: Payer: Self-pay

## 2020-07-13 ENCOUNTER — Encounter: Payer: Self-pay | Admitting: Pulmonary Disease

## 2020-07-13 VITALS — BP 110/58 | HR 93 | Temp 97.8°F | Ht 64.0 in | Wt 155.6 lb

## 2020-07-13 DIAGNOSIS — R0683 Snoring: Secondary | ICD-10-CM | POA: Diagnosis not present

## 2020-07-13 NOTE — Patient Instructions (Signed)
Will arrange for sleep study Will call to arrange for follow up after sleep study reviewed 

## 2020-07-13 NOTE — Progress Notes (Signed)
[facility]  Chief Complaint  Patient presents with   Consult    sleep consult    Constitutional:  BP (!) 110/58 (BP Location: Right Arm, Cuff Size: Normal)    Pulse 93    Temp 97.8 F (36.6 C) (Other (Comment)) Comment (Src): wrist   Ht 5\' 4"  (1.626 m)    Wt 155 lb 9.6 oz (70.6 kg)    SpO2 96% Comment: room air   BMI 26.71 kg/m   Past Medical History:  Allergies, Anxiety, Asthma, Gallstones, GERD, Melanoma  Past Surgical History:  Her  has a past surgical history that includes Cholecystectomy; Cryotherapy; and Melanoma excision (09/17/2012).  Brief Summary:  Alexandra [name] is a 44 y.o. female with snoring.      Subjective:   Her husband has been worried about her snoring, and says she stops breathing while asleep.  She will wake up hearing herself snore or feel like she is having trouble with her breathing.  Several of her family members have sleep apnea.  She gets episodes of reflux at night.  She has to use a mouthguard for teeth grinding.  She is prone to getting headaches.  She will take naps in the afternoon.  She goes to sleep at 11 pm.  She falls asleep in 15 minutes.  She wakes up 1 or 2 times to use the bathroom.  She gets out of bed at 730 am.  She feels tired in the morning.  She does not use anything to help her fall sleep or stay awake.  She denies sleep walking, sleep talking, bruxism, or nightmares.  There is no history of restless legs.  She denies sleep hallucinations, sleep paralysis, or cataplexy.  The Epworth score is 9 out of 24.   Physical Exam:   Appearance - well kempt   ENMT - no sinus tenderness, no oral exudate, no LAN, Mallampati 3 airway, no stridor, scalloped tongue, elongated uvula  Respiratory - equal breath sounds bilaterally, no wheezing or rales  CV - s1s2 regular rate and rhythm, no murmurs  Ext - no clubbing, no edema  Skin - no rashes  Psych - normal mood and affect   Sleep  Tests:    Social History:  She  reports that she has never smoked. She has never used smokeless tobacco. She reports that she does not drink alcohol and does not use drugs.  Family History:  Her family history includes Breast cancer in her maternal grandmother; Colon cancer in an other family member; Depression in her mother; Diabetes in her father and paternal grandfather; Heart disease in her maternal grandfather; Hyperlipidemia in her father; Hypertension in her father; Mental illness in her mother.    Discussion:  She has snoring, sleep disruption, daytime sleepiness, and apnea.  She has history of mood disorder and reflux at night.  She has family history of sleep apnea. I am concerned she could have obstructive sleep apnea also.  Assessment/Plan:   Snoring with excessive daytime sleepiness. - will need to arrange for a home sleep study  GERD. - explained how sleep apnea can impact this - discussed sleep positioning to help minimize impact of reflux, specifically to raise the entire head of her bed rather than sleeping on extra pillows  Obesity. - discussed how weight can impact sleep and risk for sleep disordered breathing - discussed options to assist with weight loss: combination of diet modification, cardiovascular and strength training exercises  Cardiovascular risk. -  had an extensive discussion regarding the adverse health consequences related to untreated sleep disordered breathing - specifically discussed the risks for hypertension, coronary artery disease, cardiac dysrhythmias, cerebrovascular disease, and diabetes - lifestyle modification discussed  Safe driving practices. - discussed how sleep disruption can increase risk of accidents, particularly when driving - safe driving practices were discussed  Therapies for obstructive sleep apnea. - if the sleep study shows significant sleep apnea, then various therapies for treatment were reviewed: CPAP, oral appliance,  and surgical interventions  Time Spent Involved in Patient Care on Day of Examination:  32 minutes  Follow up:  Patient Instructions  Will arrange for sleep study Will call to arrange for follow up after sleep study reviewed    Medication List:   Allergies as of 07/13/2020      Reactions   Sulfamethoxazole-trimethoprim    REACTION: hives   Adhesive [tape] Rash   Steri strips      Medication List       Accurate as of July 13, 2020 11:57 AM. If you have any questions, ask your nurse or doctor.        albuterol 108 (90 Base) MCG/ACT inhaler Commonly known as: ProAir HFA Inhale 2 puffs into the lungs every 4 (four) hours as needed for wheezing.   loratadine 10 MG tablet Commonly known as: CLARITIN Take 10 mg by mouth daily.       Signature:  Chesley Mires, MD [place] Pager - [phone] 07/13/2020, 11:57 AM

## 2020-07-14 DIAGNOSIS — M9908 Segmental and somatic dysfunction of rib cage: Secondary | ICD-10-CM | POA: Diagnosis not present

## 2020-07-14 DIAGNOSIS — M9902 Segmental and somatic dysfunction of thoracic region: Secondary | ICD-10-CM | POA: Diagnosis not present

## 2020-07-14 DIAGNOSIS — M9903 Segmental and somatic dysfunction of lumbar region: Secondary | ICD-10-CM | POA: Diagnosis not present

## 2020-07-14 DIAGNOSIS — M5386 Other specified dorsopathies, lumbar region: Secondary | ICD-10-CM | POA: Diagnosis not present

## 2020-07-23 ENCOUNTER — Encounter: Payer: Self-pay | Admitting: Family Medicine

## 2020-08-04 ENCOUNTER — Other Ambulatory Visit: Payer: Self-pay

## 2020-08-04 ENCOUNTER — Ambulatory Visit: Payer: BC Managed Care – PPO

## 2020-08-04 DIAGNOSIS — G4733 Obstructive sleep apnea (adult) (pediatric): Secondary | ICD-10-CM | POA: Diagnosis not present

## 2020-08-04 DIAGNOSIS — R0683 Snoring: Secondary | ICD-10-CM

## 2020-08-10 ENCOUNTER — Telehealth: Payer: Self-pay | Admitting: Pulmonary Disease

## 2020-08-10 DIAGNOSIS — G4733 Obstructive sleep apnea (adult) (pediatric): Secondary | ICD-10-CM | POA: Diagnosis not present

## 2020-08-10 NOTE — Telephone Encounter (Signed)
HST 08/04/20 >> AHI 12.2, SpO2 low 89%   Please inform her that her sleep study shows mild obstructive sleep apnea.  Please arrange for ROV with me or NP to discuss treatment options.

## 2020-08-11 ENCOUNTER — Other Ambulatory Visit: Payer: Self-pay

## 2020-08-11 ENCOUNTER — Ambulatory Visit (INDEPENDENT_AMBULATORY_CARE_PROVIDER_SITE_OTHER): Payer: BC Managed Care – PPO

## 2020-08-11 ENCOUNTER — Telehealth: Payer: Self-pay | Admitting: Pulmonary Disease

## 2020-08-11 DIAGNOSIS — Z23 Encounter for immunization: Secondary | ICD-10-CM

## 2020-08-11 NOTE — Progress Notes (Signed)
Alexandra [name], 44 y.o. female presents to the office today for her 3rd Hepatitis B vaccine per PCP orders. HEPLISAV-B was administered IM in the left deltoid. Patient tolerated well and left the office in good condition. Fritz Pickerel, LPN

## 2020-08-11 NOTE — Telephone Encounter (Signed)
Results were stated to pt by Willeen Cass, RN. Nothing further needed.

## 2020-08-11 NOTE — Telephone Encounter (Signed)
Called and left message on voicemail to please return phone call to go over results. Contact number provided. 

## 2020-08-11 NOTE — Telephone Encounter (Signed)
Returned missed call and went over HST results per Dr Halford Chessman. All questions answered and patient expressed full understanding. Scheduled an office visit with an NP for Friday, 08/13/2020 at [place] at the [facility] office. Patient agreeable to time, date and location and had no other questions and/or concerns. Nothing further needed at this time.

## 2020-08-13 ENCOUNTER — Ambulatory Visit (INDEPENDENT_AMBULATORY_CARE_PROVIDER_SITE_OTHER): Payer: BC Managed Care – PPO | Admitting: Pulmonary Disease

## 2020-08-13 ENCOUNTER — Other Ambulatory Visit: Payer: Self-pay

## 2020-08-13 ENCOUNTER — Encounter: Payer: Self-pay | Admitting: Pulmonary Disease

## 2020-08-13 VITALS — BP 112/72 | HR 98 | Temp 98.3°F | Ht 63.0 in | Wt 154.0 lb

## 2020-08-13 DIAGNOSIS — Z0000 Encounter for general adult medical examination without abnormal findings: Secondary | ICD-10-CM | POA: Diagnosis not present

## 2020-08-13 DIAGNOSIS — G4733 Obstructive sleep apnea (adult) (pediatric): Secondary | ICD-10-CM | POA: Diagnosis not present

## 2020-08-13 NOTE — Progress Notes (Signed)
Reviewed and agree with assessment/plan.   Chesley Mires, MD [facility] Pulmonary/Critical Care 08/13/2020, 10:07 AM Pager:  [phone]

## 2020-08-13 NOTE — Assessment & Plan Note (Signed)
Up-to-date with COVID-19 vaccinations Would recommend seasonal flu vaccine, patient is aware to receive this

## 2020-08-13 NOTE — Patient Instructions (Addendum)
You were seen today by Lauraine Rinne, NP  for:   1. OSA (obstructive sleep apnea)  New CPAP start DME: [employer] [facility] Mask of choice Supplies   We recommend that you start using your CPAP daily >>>Keep up the hard work using your device >>> Goal should be wearing this for the entire night that you are sleeping, at least 4 to 6 hours  Remember:   Do not drive or operate heavy machinery if tired or drowsy.   Please notify the supply company and office if you are unable to use your device regularly due to missing supplies or machine being broken.   Work on maintaining a healthy weight and following your recommended nutrition plan   Maintain proper daily exercise and movement   Maintaining proper use of your device can also help improve management of other chronic illnesses such as: Blood pressure, blood sugars, and weight management.   BiPAP/ CPAP Cleaning:  >>>Clean weekly, with Dawn soap, and bottle brush.  Set up to air dry. >>> Wipe mask out daily with wet wipe or towelette    Follow Up:    Return in about 2 months (around 10/13/2020), or if symptoms worsen or fail to improve, for Follow up with Dr. Halford Chessman, Follow up with Wyn Quaker FNP-C.   Notification of test results are managed in the following manner: If there are  any recommendations or changes to the  plan of care discussed in office today,  we will contact you and let you know what they are. If you do not hear from [place], then your results are normal and you can view them through your  MyChart account , or a letter will be sent to you. Thank you again for trusting [place] with your care  - Thank you, [facility]    It is flu season:   >>> Best ways to protect herself from the flu: Receive the yearly flu vaccine, practice good hand hygiene washing with soap and also using hand sanitizer when available, eat a nutritious meals, get adequate rest, hydrate appropriately       Please contact the office if your symptoms worsen  or you have concerns that you are not improving.   Thank you for choosing [facility] for your healthcare, and for allowing [place] to partner with you on your healthcare journey. I am thankful to be able to provide care to you today.   Wyn Quaker FNP-C    Sleep Apnea Sleep apnea affects breathing during sleep. It causes breathing to stop for a short time or to become shallow. It can also increase the risk of:  Heart attack.  Stroke.  Being very overweight (obese).  Diabetes.  Heart failure.  Irregular heartbeat. The goal of treatment is to help you breathe normally again. What are the causes? There are three kinds of sleep apnea:  Obstructive sleep apnea. This is caused by a blocked or collapsed airway.  Central sleep apnea. This happens when the brain does not send the right signals to the muscles that control breathing.  Mixed sleep apnea. This is a combination of obstructive and central sleep apnea. The most common cause of this condition is a collapsed or blocked airway. This can happen if:  Your throat muscles are too relaxed.  Your tongue and tonsils are too large.  You are overweight.  Your airway is too small. What increases the risk?  Being overweight.  Smoking.  Having a small airway.  Being older.  Being female.  Drinking alcohol.  Taking medicines to calm yourself (sedatives or tranquilizers).  Having family members with the condition. What are the signs or symptoms?  Trouble staying asleep.  Being sleepy or tired during the day.  Getting angry a lot.  Loud snoring.  Headaches in the morning.  Not being able to focus your mind (concentrate).  Forgetting things.  Less interest in sex.  Mood swings.  Personality changes.  Feelings of sadness (depression).  Waking up a lot during the night to pee (urinate).  Dry mouth.  Sore throat. How is this diagnosed?  Your medical history.  A physical exam.  A test that is  done when you are sleeping (sleep study). The test is most often done in a sleep lab but may also be done at home. How is this treated?   Sleeping on your side.  Using a medicine to get rid of mucus in your nose (decongestant).  Avoiding the use of alcohol, medicines to help you relax, or certain pain medicines (narcotics).  Losing weight, if needed.  Changing your diet.  Not smoking.  Using a machine to open your airway while you sleep, such as: ? An oral appliance. This is a mouthpiece that shifts your lower jaw forward. ? A CPAP device. This device blows air through a mask when you breathe out (exhale). ? An EPAP device. This has valves that you put in each nostril. ? A BPAP device. This device blows air through a mask when you breathe in (inhale) and breathe out.  Having surgery if other treatments do not work. It is important to get treatment for sleep apnea. Without treatment, it can lead to:  High blood pressure.  Coronary artery disease.  In men, not being able to have an erection (impotence).  Reduced thinking ability. Follow these instructions at home: Lifestyle  Make changes that your doctor recommends.  Eat a healthy diet.  Lose weight if needed.  Avoid alcohol, medicines to help you relax, and some pain medicines.  Do not use any products that contain nicotine or tobacco, such as cigarettes, e-cigarettes, and chewing tobacco. If you need help quitting, ask your doctor. General instructions  Take over-the-counter and prescription medicines only as told by your doctor.  If you were given a machine to use while you sleep, use it only as told by your doctor.  If you are having surgery, make sure to tell your doctor you have sleep apnea. You may need to bring your device with you.  Keep all follow-up visits as told by your doctor. This is important. Contact a doctor if:  The machine that you were given to use during sleep bothers you or does not seem to  be working.  You do not get better.  You get worse. Get help right away if:  Your chest hurts.  You have trouble breathing in enough air.  You have an uncomfortable feeling in your back, arms, or stomach.  You have trouble talking.  One side of your body feels weak.  A part of your face is hanging down. These symptoms may be an emergency. Do not wait to see if the symptoms will go away. Get medical help right away. Call your local emergency services (911 in the [place] Do not drive yourself to the hospital. Summary  This condition affects breathing during sleep.  The most common cause is a collapsed or blocked airway.  The goal of treatment is to help you breathe normally while  you sleep. This information is not intended to replace advice given to you by your health care provider. Make sure you discuss any questions you have with your health care provider. Document Revised: 07/12/2018 Document Reviewed: 05/21/2018 Elsevier Patient Education  [place].

## 2020-08-13 NOTE — Assessment & Plan Note (Signed)
Discussed treatment options such as oral appliance or CPAP therapy Patient would like to proceed forward with CPAP therapy  Plan: Start CPAP therapy Follow-up in 2 months

## 2020-08-13 NOTE — Progress Notes (Signed)
@Patient  ID: Alexandra [name], female    DOB: 04-Sep-1976, 44 y.o.   MRN: [id number]  Chief Complaint  Patient presents with  . Follow-up    Snoring, HST results    Referring provider: Midge Minium, MD  HPI:  44 year old female never smoker followed in our office for mild obstructive sleep apnea  PMH: Asthma, GERD, hyperlipidemia, depression Smoker/ Smoking History: Never smoker Maintenance: None Pt of: Dr.Sood   08/13/2020  - Visit   44 year old female never smoker followed in our office for mild obstructive sleep apnea.  She recently completed a home sleep study that showed an AHI of 12.2.  She is presenting today to discuss his results.  She is wanting to start treatment.  She is thought about an oral appliance as well CPAP therapy.  She would like to proceed forward with CPAP therapy.  Her father and brother both have obstructive sleep apnea and are managed on CPAP therapy.  Patient reports that she is very symptomatic with daytime fatigue and sleepiness.  She takes naps daily.  Usually between 30 minutes to 1 hour.  She also has 2 young kids and her symptoms are affecting her day-to-day life with managing them.  She is up-to-date with her COVID-19 vaccinations.  She has not received the seasonal flu vaccine yet.  She plans on receiving it.  She would not like it today.  Questionaires / Pulmonary Flowsheets:   ACT:  No flowsheet data found.  MMRC: No flowsheet data found.  Epworth:  Results of the Epworth flowsheet 07/13/2020  Sitting and reading 1  Watching TV 1  Sitting, inactive in a public place (e.g. a theatre or a meeting) 1  As a passenger in a car for an hour without a break 3  Lying down to rest in the afternoon when circumstances permit 3  Sitting and talking to someone 0  Sitting quietly after a lunch without alcohol 0  In a car, while stopped for a few minutes in traffic 0  Total score 9    Tests:   HST 08/04/20 >> AHI 12.2, SpO2 low  89%  FENO:  No results found for: NITRICOXIDE  PFT: No flowsheet data found.  WALK:  No flowsheet data found.  Imaging: No results found.  Lab Results:  CBC    Component Value Date/Time   WBC 8.1 04/19/2020 0847   RBC 4.58 04/19/2020 0847   HGB 13.8 04/19/2020 0847   HCT 41.1 04/19/2020 0847   PLT 300.0 04/19/2020 0847   MCV 89.8 04/19/2020 0847   MCV 90.1 05/08/2014 1423   MCH 29.6 04/16/2019 1611   MCHC 33.5 04/19/2020 0847   RDW 13.6 04/19/2020 0847   LYMPHSABS 3.0 04/19/2020 0847   MONOABS 0.6 04/19/2020 0847   EOSABS 0.2 04/19/2020 0847   BASOSABS 0.1 04/19/2020 0847    BMET    Component Value Date/Time   NA 136 04/19/2020 0847   NA 141 07/11/2016 0000   K 4.3 04/19/2020 0847   CL 100 04/19/2020 0847   CO2 28 04/19/2020 0847   GLUCOSE 86 04/19/2020 0847   BUN 12 04/19/2020 0847   BUN 12 07/11/2016 0000   CREATININE 0.64 04/19/2020 0847   CREATININE 0.66 04/16/2019 1611   CALCIUM 9.1 04/19/2020 0847   GFRNONAA >90 09/16/2012 0833   GFRAA >90 09/16/2012 0833    BNP No results found for: BNP  ProBNP No results found for: PROBNP  Specialty Problems      Pulmonary Problems  OSA (obstructive sleep apnea)      Allergies  Allergen Reactions  . Sulfamethoxazole-Trimethoprim     REACTION: hives  . Adhesive [Tape] Rash    Steri strips    Immunization History  Administered Date(s) Administered  . Hepatitis B, adult 12/25/2017, 01/28/2018  . Hepb-cpg 08/11/2020  . PFIZER SARS-COV-2 Vaccination 12/22/2019, 01/13/2020  . Rho (D) Immune Globulin 07/09/2011  . Tdap 12/08/2013    Past Medical History:  Diagnosis Date  . Abnormal Pap smear    cryo 1999  . Allergy   . Anxiety   . Asthma    also has current cough,last attack <yr  . Family history of malignant neoplasm of gastrointestinal tract   . Gallbladder attack   . Gallstone   . GERD (gastroesophageal reflux disease)   . Hemorrhoids   . Hx: UTI (urinary tract infection)   .  Melanoma of lower limb ([place])     Tobacco History: Social History   Tobacco Use  Smoking Status Never Smoker  Smokeless Tobacco Never Used   Counseling given: Not Answered   Continue to not smoke  Outpatient Encounter Medications as of 08/13/2020  Medication Sig  . famotidine (PEPCID) 10 MG tablet Take 10 mg by mouth at bedtime.  Marland Kitchen loratadine (CLARITIN) 10 MG tablet Take 10 mg by mouth daily.  . [DISCONTINUED] albuterol (PROAIR HFA) 108 (90 Base) MCG/ACT inhaler Inhale 2 puffs into the lungs every 4 (four) hours as needed for wheezing. (Patient not taking: Reported on 04/19/2020)   No facility-administered encounter medications on file as of 08/13/2020.     Review of Systems  Review of Systems  Constitutional: Positive for fatigue. Negative for activity change and fever.  HENT: Negative for sinus pressure, sinus pain and sore throat.   Respiratory: Negative for cough, shortness of breath and wheezing.   Cardiovascular: Negative for chest pain and palpitations.  Gastrointestinal: Negative for diarrhea, nausea and vomiting.  Musculoskeletal: Negative for arthralgias.  Neurological: Negative for dizziness.  Psychiatric/Behavioral: Positive for sleep disturbance. The patient is not nervous/anxious.      Physical Exam  BP 112/72 (BP Location: Left Arm, Cuff Size: Normal)   Pulse 98   Temp 98.3 F (36.8 C) (Oral)   Ht 5\' 3"  (1.6 m)   Wt 154 lb (69.9 kg)   SpO2 97%   BMI 27.28 kg/m   Wt Readings from Last 5 Encounters:  08/13/20 154 lb (69.9 kg)  07/13/20 155 lb 9.6 oz (70.6 kg)  04/19/20 156 lb 6 oz (70.9 kg)  07/17/19 145 lb (65.8 kg)  05/12/19 147 lb 8 oz (66.9 kg)    BMI Readings from Last 5 Encounters:  08/13/20 27.28 kg/m  07/13/20 26.71 kg/m  04/19/20 26.02 kg/m  07/17/19 24.13 kg/m  05/12/19 24.55 kg/m     Physical Exam Vitals and nursing note reviewed.  Constitutional:      General: She is not in acute distress.    Appearance: Normal  appearance. She is normal weight.  HENT:     Head: Normocephalic and atraumatic.     Right Ear: External ear normal.     Left Ear: External ear normal.     Nose: Rhinorrhea present. No congestion.     Mouth/Throat:     Mouth: Mucous membranes are moist.     Pharynx: Oropharynx is clear.     Comments: Postnasal drip, Mallampati 2 Eyes:     Pupils: Pupils are equal, round, and reactive to light.  Cardiovascular:  Rate and Rhythm: Normal rate and regular rhythm.     Pulses: Normal pulses.     Heart sounds: Normal heart sounds. No murmur heard.   Pulmonary:     Effort: Pulmonary effort is normal. No respiratory distress.     Breath sounds: Normal breath sounds. No decreased air movement. No decreased breath sounds, wheezing or rales.  Musculoskeletal:     Cervical back: Normal range of motion.  Skin:    General: Skin is warm and dry.     Capillary Refill: Capillary refill takes less than 2 seconds.  Neurological:     General: No focal deficit present.     Mental Status: She is alert and oriented to person, place, and time. Mental status is at baseline.     Gait: Gait normal.  Psychiatric:        Mood and Affect: Mood normal.        Behavior: Behavior normal.        Thought Content: Thought content normal.        Judgment: Judgment normal.       Assessment & Plan:   OSA (obstructive sleep apnea) Discussed treatment options such as oral appliance or CPAP therapy Patient would like to proceed forward with CPAP therapy  Plan: Start CPAP therapy Follow-up in 2 months  Healthcare maintenance Up-to-date with COVID-19 vaccinations Would recommend seasonal flu vaccine, patient is aware to receive this    Return in about 2 months (around 10/13/2020), or if symptoms worsen or fail to improve, for Follow up with Dr. Halford Chessman, Follow up with Wyn Quaker FNP-C.   Lauraine Rinne, NP 08/13/2020   This appointment required 24 minutes of patient care (this includes precharting, chart  review, review of results, face-to-face care, etc.).

## 2020-08-16 DIAGNOSIS — M9902 Segmental and somatic dysfunction of thoracic region: Secondary | ICD-10-CM | POA: Diagnosis not present

## 2020-08-16 DIAGNOSIS — M9908 Segmental and somatic dysfunction of rib cage: Secondary | ICD-10-CM | POA: Diagnosis not present

## 2020-08-16 DIAGNOSIS — M9903 Segmental and somatic dysfunction of lumbar region: Secondary | ICD-10-CM | POA: Diagnosis not present

## 2020-08-16 DIAGNOSIS — M5386 Other specified dorsopathies, lumbar region: Secondary | ICD-10-CM | POA: Diagnosis not present

## 2020-08-23 NOTE — Telephone Encounter (Signed)
Happy to provide a signed prescription for the patient so she can proceed forward with starting CPAP therapy.  Another option can also be contacting additional local DME companies to see if there is a another timeframe or a different provider that would have them in stock.Ultimately ask the patient's choice.Wyn Quaker, FNP

## 2020-08-23 NOTE — Telephone Encounter (Signed)
Alexandra [name] please advise. Thanks

## 2020-08-24 NOTE — Telephone Encounter (Signed)
As stated yesterday more than happy to sign this prescription.  Please generated as outlined by the patient.  And I will sign it.Wyn Quaker FNP

## 2020-08-24 NOTE — Telephone Encounter (Signed)
Aaron Edelman, Please see patient comment for specifics of patient's CPAP machine.  She would like a script, we will let her know when it is ready for her to pick up.  Thank you.

## 2020-09-13 DIAGNOSIS — M9908 Segmental and somatic dysfunction of rib cage: Secondary | ICD-10-CM | POA: Diagnosis not present

## 2020-09-13 DIAGNOSIS — M9902 Segmental and somatic dysfunction of thoracic region: Secondary | ICD-10-CM | POA: Diagnosis not present

## 2020-09-13 DIAGNOSIS — M9903 Segmental and somatic dysfunction of lumbar region: Secondary | ICD-10-CM | POA: Diagnosis not present

## 2020-09-13 DIAGNOSIS — M5386 Other specified dorsopathies, lumbar region: Secondary | ICD-10-CM | POA: Diagnosis not present

## 2020-10-14 ENCOUNTER — Encounter: Payer: Self-pay | Admitting: Family Medicine

## 2020-10-18 ENCOUNTER — Ambulatory Visit: Payer: BC Managed Care – PPO | Admitting: Pulmonary Disease

## 2020-10-18 DIAGNOSIS — M5386 Other specified dorsopathies, lumbar region: Secondary | ICD-10-CM | POA: Diagnosis not present

## 2020-10-18 DIAGNOSIS — M9902 Segmental and somatic dysfunction of thoracic region: Secondary | ICD-10-CM | POA: Diagnosis not present

## 2020-10-18 DIAGNOSIS — M9908 Segmental and somatic dysfunction of rib cage: Secondary | ICD-10-CM | POA: Diagnosis not present

## 2020-10-18 DIAGNOSIS — M9903 Segmental and somatic dysfunction of lumbar region: Secondary | ICD-10-CM | POA: Diagnosis not present

## 2020-10-21 ENCOUNTER — Other Ambulatory Visit: Payer: Self-pay

## 2020-10-21 ENCOUNTER — Ambulatory Visit: Payer: BC Managed Care – PPO | Admitting: Pulmonary Disease

## 2020-10-21 ENCOUNTER — Encounter: Payer: Self-pay | Admitting: Pulmonary Disease

## 2020-10-21 VITALS — BP 112/74 | HR 81 | Temp 97.4°F | Ht 65.0 in | Wt 163.8 lb

## 2020-10-21 DIAGNOSIS — G4733 Obstructive sleep apnea (adult) (pediatric): Secondary | ICD-10-CM

## 2020-10-21 DIAGNOSIS — Z0000 Encounter for general adult medical examination without abnormal findings: Secondary | ICD-10-CM

## 2020-10-21 NOTE — Assessment & Plan Note (Signed)
Continue routine follow-up with primary care Continue preventative vaccinations as recommended

## 2020-10-21 NOTE — Progress Notes (Signed)
Reviewed and agree with assessment/plan.   Chesley Mires, MD [facility] 10/21/2020, 10:48 AM Pager:  [phone]

## 2020-10-21 NOTE — Assessment & Plan Note (Signed)
Plan: Continue CPAP therapy Follow-up in 1 year 

## 2020-10-21 NOTE — Patient Instructions (Addendum)
You were seen today by Coral Ceo, NP  for:   1. OSA (obstructive sleep apnea)  We recommend that you continue using your CPAP daily >>>Keep up the hard work using your device >>> Goal should be wearing this for the entire night that you are sleeping, at least 4 to 6 hours  Remember:  . Do not drive or operate heavy machinery if tired or drowsy.  . Please notify the supply company and office if you are unable to use your device regularly due to missing supplies or machine being broken.  . Work on maintaining a healthy weight and following your recommended nutrition plan  . Maintain proper daily exercise and movement  . Maintaining proper use of your device can also help improve management of other chronic illnesses such as: Blood pressure, blood sugars, and weight management.   BiPAP/ CPAP Cleaning:  >>>Clean weekly, with Dawn soap, and bottle brush.  Set up to air dry. >>> Wipe mask out daily with wet wipe or towelette   2. Healthcare maintenance  Continue regular maintenance vaccinations  Keep regular follow-up with PCP  Follow Up:    Return in about 1 year (around 10/21/2021), or if symptoms worsen or fail to improve, for Follow up with Dr. Craige Cotta.   Notification of test results are managed in the following manner: If there are  any recommendations or changes to the  plan of care discussed in office today,  we will contact you and let you know what they are. If you do not hear from [place], then your results are normal and you can view them through your  MyChart account , or a letter will be sent to you. Thank you again for trusting [place] with your care  - Thank you, [facility]    It is flu season:   >>> Best ways to protect herself from the flu: Receive the yearly flu vaccine, practice good hand hygiene washing with soap and also using hand sanitizer when available, eat a nutritious meals, get adequate rest, hydrate appropriately       Please contact the office if your  symptoms worsen or you have concerns that you are not improving.   Thank you for choosing [facility] for your healthcare, and for allowing [place] to partner with you on your healthcare journey. I am thankful to be able to provide care to you today.   Elisha Headland FNP-C   Sleep Apnea Sleep apnea affects breathing during sleep. It causes breathing to stop for a short time or to become shallow. It can also increase the risk of:  Heart attack.  Stroke.  Being very overweight (obese).  Diabetes.  Heart failure.  Irregular heartbeat. The goal of treatment is to help you breathe normally again. What are the causes? There are three kinds of sleep apnea:  Obstructive sleep apnea. This is caused by a blocked or collapsed airway.  Central sleep apnea. This happens when the brain does not send the right signals to the muscles that control breathing.  Mixed sleep apnea. This is a combination of obstructive and central sleep apnea. The most common cause of this condition is a collapsed or blocked airway. This can happen if:  Your throat muscles are too relaxed.  Your tongue and tonsils are too large.  You are overweight.  Your airway is too small.   What increases the risk?  Being overweight.  Smoking.  Having a small airway.  Being older.  Being female.  Drinking alcohol.  Taking medicines to calm yourself (sedatives or tranquilizers).  Having family members with the condition. What are the signs or symptoms?  Trouble staying asleep.  Being sleepy or tired during the day.  Getting angry a lot.  Loud snoring.  Headaches in the morning.  Not being able to focus your mind (concentrate).  Forgetting things.  Less interest in sex.  Mood swings.  Personality changes.  Feelings of sadness (depression).  Waking up a lot during the night to pee (urinate).  Dry mouth.  Sore throat. How is this diagnosed?  Your medical history.  A physical  exam.  A test that is done when you are sleeping (sleep study). The test is most often done in a sleep lab but may also be done at home. How is this treated?  Sleeping on your side.  Using a medicine to get rid of mucus in your nose (decongestant).  Avoiding the use of alcohol, medicines to help you relax, or certain pain medicines (narcotics).  Losing weight, if needed.  Changing your diet.  Not smoking.  Using a machine to open your airway while you sleep, such as: ? An oral appliance. This is a mouthpiece that shifts your lower jaw forward. ? A CPAP device. This device blows air through a mask when you breathe out (exhale). ? An EPAP device. This has valves that you put in each nostril. ? A BPAP device. This device blows air through a mask when you breathe in (inhale) and breathe out.  Having surgery if other treatments do not work. It is important to get treatment for sleep apnea. Without treatment, it can lead to:  High blood pressure.  Coronary artery disease.  In men, not being able to have an erection (impotence).  Reduced thinking ability.   Follow these instructions at home: Lifestyle  Make changes that your doctor recommends.  Eat a healthy diet.  Lose weight if needed.  Avoid alcohol, medicines to help you relax, and some pain medicines.  Do not use any products that contain nicotine or tobacco, such as cigarettes, e-cigarettes, and chewing tobacco. If you need help quitting, ask your doctor. General instructions  Take over-the-counter and prescription medicines only as told by your doctor.  If you were given a machine to use while you sleep, use it only as told by your doctor.  If you are having surgery, make sure to tell your doctor you have sleep apnea. You may need to bring your device with you.  Keep all follow-up visits as told by your doctor. This is important. Contact a doctor if:  The machine that you were given to use during sleep bothers  you or does not seem to be working.  You do not get better.  You get worse. Get help right away if:  Your chest hurts.  You have trouble breathing in enough air.  You have an uncomfortable feeling in your back, arms, or stomach.  You have trouble talking.  One side of your body feels weak.  A part of your face is hanging down. These symptoms may be an emergency. Do not wait to see if the symptoms will go away. Get medical help right away. Call your local emergency services (911 in the [place] Do not drive yourself to the hospital. Summary  This condition affects breathing during sleep.  The most common cause is a collapsed or blocked airway.  The goal of treatment is to help you breathe normally while you sleep.  This information is not intended to replace advice given to you by your health care provider. Make sure you discuss any questions you have with your health care provider. Document Revised: 07/12/2018 Document Reviewed: 05/21/2018 Elsevier Patient Education  [place].

## 2020-10-21 NOTE — Progress Notes (Signed)
@Patient  ID: Alexandra [name], female    DOB: 05-25-1976, 45 y.o.   MRN: [id number]  Chief Complaint  Patient presents with  . Follow-up    Doing well    Referring provider: Midge Minium, MD  HPI:  45 year old female never smoker followed in our office for mild obstructive sleep apnea  PMH: Asthma, GERD, hyperlipidemia, depression Smoker/ Smoking History: Never smoker Maintenance: None Pt of: Dr.Sood   10/21/2020  - Visit   45 year old female never smoker [facility] office for mild obstructive sleep apnea.  Patient presenting today status post starting CPAP therapy.  CPAP therapy is going well per patient.  She reports less daytime fatigue.  She reports significant improvement with daytime sleepiness.  CPAP compliance report listed below:  09/09/2020 - 10/20/2020- CPAP compliance report-40 one of the last 42 days use, 37 of those days greater than 4 hours, average usage 7 hours and 24 minutes, APAP setting 4-20, AHI 0.2  Patient reports that she is using nasal pillows as a mask.  She prefers this.  No significant leaks on compliance report.   Questionaires / Pulmonary Flowsheets:   ACT:  No flowsheet data found.  MMRC: No flowsheet data found.  Epworth:  Results of the Epworth flowsheet 07/13/2020  Sitting and reading 1  Watching TV 1  Sitting, inactive in a public place (e.g. a theatre or a meeting) 1  As a passenger in a car for an hour without a break 3  Lying down to rest in the afternoon when circumstances permit 3  Sitting and talking to someone 0  Sitting quietly after a lunch without alcohol 0  In a car, while stopped for a few minutes in traffic 0  Total score 9    Tests:   HST 08/04/20 >> AHI 12.2, SpO2 low 89%  FENO:  No results found for: NITRICOXIDE  PFT: No flowsheet data found.  WALK:  No flowsheet data found.  Imaging: No results found.  Lab Results:  CBC    Component Value Date/Time   WBC 8.1 04/19/2020 0847   RBC 4.58  04/19/2020 0847   HGB 13.8 04/19/2020 0847   HCT 41.1 04/19/2020 0847   PLT 300.0 04/19/2020 0847   MCV 89.8 04/19/2020 0847   MCV 90.1 05/08/2014 1423   MCH 29.6 04/16/2019 1611   MCHC 33.5 04/19/2020 0847   RDW 13.6 04/19/2020 0847   LYMPHSABS 3.0 04/19/2020 0847   MONOABS 0.6 04/19/2020 0847   EOSABS 0.2 04/19/2020 0847   BASOSABS 0.1 04/19/2020 0847    BMET    Component Value Date/Time   NA 136 04/19/2020 0847   NA 141 07/11/2016 0000   K 4.3 04/19/2020 0847   CL 100 04/19/2020 0847   CO2 28 04/19/2020 0847   GLUCOSE 86 04/19/2020 0847   BUN 12 04/19/2020 0847   BUN 12 07/11/2016 0000   CREATININE 0.64 04/19/2020 0847   CREATININE 0.66 04/16/2019 1611   CALCIUM 9.1 04/19/2020 0847   GFRNONAA >90 09/16/2012 0833   GFRAA >90 09/16/2012 0833    BNP No results found for: BNP  ProBNP No results found for: PROBNP  Specialty Problems      Pulmonary Problems   OSA (obstructive sleep apnea)      Allergies  Allergen Reactions  . Sulfamethoxazole-Trimethoprim     REACTION: hives  . Adhesive [Tape] Rash    Steri strips    Immunization History  Administered Date(s) Administered  . Hepatitis B, adult 12/25/2017, 01/28/2018  .  Hepb-cpg 08/11/2020  . PFIZER SARS-COV-2 Vaccination 12/22/2019, 01/13/2020  . Rho (D) Immune Globulin 07/09/2011  . Tdap 12/08/2013    Past Medical History:  Diagnosis Date  . Abnormal Pap smear    cryo 1999  . Allergy   . Anxiety   . Asthma    also has current cough,last attack <yr  . Family history of malignant neoplasm of gastrointestinal tract   . Gallbladder attack   . Gallstone   . GERD (gastroesophageal reflux disease)   . Hemorrhoids   . Hx: UTI (urinary tract infection)   . Melanoma of lower limb ([place])     Tobacco History: Social History   Tobacco Use  Smoking Status Never Smoker  Smokeless Tobacco Never Used   Counseling given: Not Answered   Continue to not smoke  Outpatient Encounter Medications as of  10/21/2020  Medication Sig  . loratadine (CLARITIN) 10 MG tablet Take 10 mg by mouth daily.  . [DISCONTINUED] famotidine (PEPCID) 10 MG tablet Take 10 mg by mouth at bedtime.   No facility-administered encounter medications on file as of 10/21/2020.     Review of Systems  Review of Systems  Constitutional: Negative for activity change, fatigue and fever.  HENT: Negative for sinus pressure, sinus pain and sore throat.   Respiratory: Negative for cough, shortness of breath and wheezing.   Cardiovascular: Negative for chest pain and palpitations.  Gastrointestinal: Negative for diarrhea, nausea and vomiting.  Musculoskeletal: Negative for arthralgias.  Neurological: Negative for dizziness.  Psychiatric/Behavioral: Negative for sleep disturbance. The patient is not nervous/anxious.      Physical Exam  BP 112/74 (BP Location: Left Arm, Cuff Size: Normal)   Pulse 81   Temp (!) 97.4 F (36.3 C) (Oral)   Ht 5\' 5"  (1.651 m)   Wt 163 lb 12.8 oz (74.3 kg)   SpO2 98%   BMI 27.26 kg/m   Wt Readings from Last 5 Encounters:  10/21/20 163 lb 12.8 oz (74.3 kg)  08/13/20 154 lb (69.9 kg)  07/13/20 155 lb 9.6 oz (70.6 kg)  04/19/20 156 lb 6 oz (70.9 kg)  07/17/19 145 lb (65.8 kg)    BMI Readings from Last 5 Encounters:  10/21/20 27.26 kg/m  08/13/20 27.28 kg/m  07/13/20 26.71 kg/m  04/19/20 26.02 kg/m  07/17/19 24.13 kg/m     Physical Exam Vitals and nursing note reviewed.  Constitutional:      General: She is not in acute distress.    Appearance: Normal appearance. She is normal weight.  HENT:     Head: Normocephalic and atraumatic.     Right Ear: External ear normal.     Left Ear: External ear normal.     Nose: Nose normal. No congestion.     Mouth/Throat:     Mouth: Mucous membranes are moist.     Pharynx: Oropharynx is clear.  Eyes:     Pupils: Pupils are equal, round, and reactive to light.  Cardiovascular:     Rate and Rhythm: Normal rate and regular rhythm.      Pulses: Normal pulses.     Heart sounds: Normal heart sounds. No murmur heard.   Pulmonary:     Effort: Pulmonary effort is normal. No respiratory distress.     Breath sounds: Normal breath sounds. No decreased air movement. No decreased breath sounds, wheezing or rales.  Musculoskeletal:     Cervical back: Normal range of motion.  Skin:    General: Skin is warm and dry.  Capillary Refill: Capillary refill takes less than 2 seconds.  Neurological:     General: No focal deficit present.     Mental Status: She is alert and oriented to person, place, and time. Mental status is at baseline.     Gait: Gait normal.  Psychiatric:        Mood and Affect: Mood normal.        Behavior: Behavior normal.        Thought Content: Thought content normal.        Judgment: Judgment normal.       Assessment & Plan:   OSA (obstructive sleep apnea) Plan: Continue CPAP therapy Follow-up in 1 year   Healthcare maintenance Continue routine follow-up with primary care Continue preventative vaccinations as recommended    Return in about 1 year (around 10/21/2021), or if symptoms worsen or fail to improve, for Follow up with Dr. Halford Chessman.   Lauraine Rinne, NP 10/21/2020   This appointment required 32 minutes of patient care (this includes precharting, chart review, review of results, face-to-face care, etc.).

## 2020-10-27 DIAGNOSIS — Z6826 Body mass index (BMI) 26.0-26.9, adult: Secondary | ICD-10-CM | POA: Diagnosis not present

## 2020-10-27 DIAGNOSIS — Z1231 Encounter for screening mammogram for malignant neoplasm of breast: Secondary | ICD-10-CM | POA: Diagnosis not present

## 2020-10-27 DIAGNOSIS — Z01419 Encounter for gynecological examination (general) (routine) without abnormal findings: Secondary | ICD-10-CM | POA: Diagnosis not present

## 2020-10-27 LAB — HM MAMMOGRAPHY

## 2020-10-28 ENCOUNTER — Other Ambulatory Visit: Payer: Self-pay

## 2020-10-28 ENCOUNTER — Encounter: Payer: Self-pay | Admitting: Emergency Medicine

## 2020-10-28 DIAGNOSIS — Z1211 Encounter for screening for malignant neoplasm of colon: Secondary | ICD-10-CM

## 2020-11-15 DIAGNOSIS — M5386 Other specified dorsopathies, lumbar region: Secondary | ICD-10-CM | POA: Diagnosis not present

## 2020-11-15 DIAGNOSIS — M9908 Segmental and somatic dysfunction of rib cage: Secondary | ICD-10-CM | POA: Diagnosis not present

## 2020-11-15 DIAGNOSIS — M9903 Segmental and somatic dysfunction of lumbar region: Secondary | ICD-10-CM | POA: Diagnosis not present

## 2020-11-15 DIAGNOSIS — M9902 Segmental and somatic dysfunction of thoracic region: Secondary | ICD-10-CM | POA: Diagnosis not present

## 2020-12-13 DIAGNOSIS — M9908 Segmental and somatic dysfunction of rib cage: Secondary | ICD-10-CM | POA: Diagnosis not present

## 2020-12-13 DIAGNOSIS — M5386 Other specified dorsopathies, lumbar region: Secondary | ICD-10-CM | POA: Diagnosis not present

## 2020-12-13 DIAGNOSIS — M9903 Segmental and somatic dysfunction of lumbar region: Secondary | ICD-10-CM | POA: Diagnosis not present

## 2020-12-13 DIAGNOSIS — M9902 Segmental and somatic dysfunction of thoracic region: Secondary | ICD-10-CM | POA: Diagnosis not present

## 2020-12-23 ENCOUNTER — Ambulatory Visit: Payer: BC Managed Care – PPO

## 2020-12-23 ENCOUNTER — Other Ambulatory Visit: Payer: Self-pay

## 2020-12-23 VITALS — Ht 65.0 in | Wt 160.0 lb

## 2020-12-23 DIAGNOSIS — Z1211 Encounter for screening for malignant neoplasm of colon: Secondary | ICD-10-CM

## 2020-12-23 MED ORDER — PLENVU 140 G PO SOLR: 1.0000 | ORAL | 0 refills | Status: DC

## 2020-12-23 NOTE — Progress Notes (Signed)
Virtual PV  No egg or soy allergy known to patient  No issues with past sedation with any surgeries or procedures Patient denies ever being told they had issues or difficulty with intubation  No FH of Malignant Hyperthermia No diet pills per patient No home 02 use per patient  No blood thinners per patient  Pt denies issues with constipation  No A fib or A flutter  EMMI video to pt or via [place] 19 guidelines implemented in PV today with Pt and RN  Pt is fully vaccinated  for Covid   Plenvu Coupon given to pt in PV today , Code to Pharmacy and  NO PA's for preps discussed with pt In PV today  Discussed with pt there will be an out-of-pocket cost for prep and that varies from $0 to 70 dollars   Due to the COVID-19 pandemic we are asking patients to follow certain guidelines.  Pt aware of COVID protocols and [facility] guidelines

## 2020-12-31 ENCOUNTER — Telehealth: Payer: Self-pay | Admitting: *Deleted

## 2020-12-31 DIAGNOSIS — Z1211 Encounter for screening for malignant neoplasm of colon: Secondary | ICD-10-CM

## 2020-12-31 MED ORDER — NA SULFATE-K SULFATE-MG SULF 17.5-3.13-1.6 GM/177ML PO SOLN: ORAL | 0 refills | Status: DC

## 2020-12-31 NOTE — Telephone Encounter (Signed)
Suprep Rx sent to pt's pharmacy and new prep instructions sent to pt's mychart-pt is aware.

## 2021-01-05 ENCOUNTER — Encounter: Payer: Self-pay | Admitting: Gastroenterology

## 2021-01-06 ENCOUNTER — Ambulatory Visit (AMBULATORY_SURGERY_CENTER): Payer: BC Managed Care – PPO | Admitting: Gastroenterology

## 2021-01-06 ENCOUNTER — Encounter: Payer: Self-pay | Admitting: Gastroenterology

## 2021-01-06 ENCOUNTER — Other Ambulatory Visit: Payer: Self-pay

## 2021-01-06 VITALS — BP 98/54 | HR 74 | Temp 98.6°F | Resp 13 | Ht 65.0 in | Wt 160.0 lb

## 2021-01-06 DIAGNOSIS — Z1211 Encounter for screening for malignant neoplasm of colon: Secondary | ICD-10-CM

## 2021-01-06 DIAGNOSIS — D128 Benign neoplasm of rectum: Secondary | ICD-10-CM

## 2021-01-06 DIAGNOSIS — D129 Benign neoplasm of anus and anal canal: Secondary | ICD-10-CM | POA: Diagnosis not present

## 2021-01-06 MED ORDER — SODIUM CHLORIDE 0.9 % IV SOLN: 500.0000 mL | Freq: Once | INTRAVENOUS | Status: DC

## 2021-01-06 NOTE — Op Note (Signed)
[facility] Patient Name: Alexandra [name] Procedure Date: 01/06/2021 9:52 AM MRN: [id number] Endoscopist: Mauri Pole , MD Age: 45 Referring MD:  Date of Birth: 01-Nov-1975 Gender: Female Account #: [id number] Procedure:                Colonoscopy Indications:              Screening for colorectal malignant neoplasm Medicines:                Monitored Anesthesia Care Procedure:                Pre-Anesthesia Assessment:                           - Prior to the procedure, a History and Physical                            was performed, and patient medications and                            allergies were reviewed. The patient's tolerance of                            previous anesthesia was also reviewed. The risks                            and benefits of the procedure and the sedation                            options and risks were discussed with the patient.                            All questions were answered, and informed consent                            was obtained. Prior Anticoagulants: The patient has                            taken no previous anticoagulant or antiplatelet                            agents. ASA Grade Assessment: II - A patient with                            mild systemic disease. After reviewing the risks                            and benefits, the patient was deemed in                            satisfactory condition to undergo the procedure.                           After obtaining informed consent, the colonoscope  was passed under direct vision. Throughout the                            procedure, the patient's blood pressure, pulse, and                            oxygen saturations were monitored continuously. The                            Olympus PFC-[id number] (#[id number]) Colonoscope was                            introduced through the anus and advanced to the the                            cecum,  identified by appendiceal orifice and                            ileocecal valve. The colonoscopy was performed                            without difficulty. The patient tolerated the                            procedure well. The quality of the bowel                            preparation was excellent. The ileocecal valve,                            appendiceal orifice, and rectum were photographed. Scope In: 9:54:33 AM Scope Out: 10:13:33 AM Scope Withdrawal Time: 0 hours 14 minutes 40 seconds  Total Procedure Duration: 0 hours 19 minutes 0 seconds  Findings:                 The perianal and digital rectal examinations were                            normal.                           A 10 mm polyp was found in the rectum. The polyp                            was sessile. The polyp was removed with a cold                            snare. Resection and retrieval were complete. To                            prevent bleeding after the polypectomy, one                            hemostatic clip was successfully placed (MR  conditional). There was no bleeding at the end of                            the procedure.                           A less than 1 mm polyp was found in the rectum. The                            polyp was sessile. The polyp was removed with a                            cold biopsy forceps. Resection and retrieval were                            complete.                           Non-bleeding internal hemorrhoids were found during                            retroflexion. The hemorrhoids were medium-sized.                           The exam was otherwise without abnormality. Complications:            No immediate complications. Estimated Blood Loss:     Estimated blood loss was minimal. Impression:               - One 10 mm polyp in the rectum, removed with a                            cold snare. Resected and retrieved. Clip (MR                             conditional) was placed.                           - One less than 1 mm polyp in the rectum, removed                            with a cold biopsy forceps. Resected and retrieved.                           - Non-bleeding internal hemorrhoids.                           - The examination was otherwise normal. Recommendation:           - Patient has a contact number available for                            emergencies. The signs and symptoms of potential  delayed complications were discussed with the                            patient. Return to normal activities tomorrow.                            Written discharge instructions were provided to the                            patient.                           - Resume previous diet.                           - Continue present medications.                           - Await pathology results.                           - No aspirin, ibuprofen, naproxen, or other                            non-steroidal anti-inflammatory drugs for 2 weeks.                           - Repeat colonoscopy in 3 - 5 years for                            surveillance based on pathology results. Mauri Pole, MD 01/06/2021 10:22:23 AM This report has been signed electronically.

## 2021-01-06 NOTE — Progress Notes (Signed)
To PACU< VSS. Report to [email or web address].tb 

## 2021-01-06 NOTE — Patient Instructions (Signed)
YOU HAD AN ENDOSCOPIC PROCEDURE TODAY AT [facility]:   Refer to the procedure report that was given to you for any specific questions about what was found during the examination.  If the procedure report does not answer your questions, please call your gastroenterologist to clarify.  If you requested that your care partner not be given the details of your procedure findings, then the procedure report has been included in a sealed envelope for you to review at your convenience later.  YOU SHOULD EXPECT: Some feelings of bloating in the abdomen. Passage of more gas than usual.  Walking can help get rid of the air that was put into your GI tract during the procedure and reduce the bloating. If you had a lower endoscopy (such as a colonoscopy or flexible sigmoidoscopy) you may notice spotting of blood in your stool or on the toilet paper. If you underwent a bowel prep for your procedure, you may not have a normal bowel movement for a few days.  Please Note:  You might notice some irritation and congestion in your nose or some drainage.  This is from the oxygen used during your procedure.  There is no need for concern and it should clear up in a day or so.  SYMPTOMS TO REPORT IMMEDIATELY:   Following lower endoscopy (colonoscopy or flexible sigmoidoscopy):  Excessive amounts of blood in the stool  Significant tenderness or worsening of abdominal pains  Swelling of the abdomen that is new, acute  Fever of 100F or higher   For urgent or emergent issues, a gastroenterologist can be reached at any hour by calling [phone]. Do not use MyChart messaging for urgent concerns.    DIET:  We do recommend a small meal at first, but then you may proceed to your regular diet.  Drink plenty of fluids but you should avoid alcoholic beverages for 24 hours.  ACTIVITY:  You should plan to take it easy for the rest of today and you should NOT DRIVE or use heavy machinery until tomorrow (because  of the sedation medicines used during the test).   MEDICATIONS: Continue present medications. No Aspirin, Ibuprofen, Naproxen, or other non-steroidal anti-inflammatory meds for 2 weeks. Tylenol is OK.  Please see handouts given to you by your recovery nurse, including "clip card".  Thank you for allowing [place] to provide for your healthcare needs today.,  FOLLOW UP: Our staff will call the number listed on your records 48-72 hours following your procedure to check on you and address any questions or concerns that you may have regarding the information given to you following your procedure. If we do not reach you, we will leave a message.  We will attempt to reach you two times.  During this call, we will ask if you have developed any symptoms of COVID 19. If you develop any symptoms (ie: fever, flu-like symptoms, shortness of breath, cough etc.) before then, please call [phone].  If you test positive for Covid 19 in the 2 weeks post procedure, please call and report this information to [place].    If any biopsies were taken you will be contacted by phone or by letter within the next 1-3 weeks.  Please call us at [phone] if you have not heard about the biopsies in 3 weeks.    SIGNATURES/CONFIDENTIALITY: You and/or your care partner have signed paperwork which will be entered into your electronic medical record.  These signatures attest to the fact that that the information  above on your After Visit Summary has been reviewed and is understood.  Full responsibility of the confidentiality of this discharge information lies with you and/or your care-partner. 

## 2021-01-06 NOTE — Progress Notes (Signed)
Medical history reviewed with no changes since PV. VS assessed by C.W 

## 2021-01-06 NOTE — Progress Notes (Signed)
Called to room to assist during endoscopic procedure.  Patient ID and intended procedure confirmed with present staff. Received instructions for my participation in the procedure from the performing physician.  

## 2021-01-10 ENCOUNTER — Telehealth: Payer: Self-pay

## 2021-01-10 DIAGNOSIS — M9908 Segmental and somatic dysfunction of rib cage: Secondary | ICD-10-CM | POA: Diagnosis not present

## 2021-01-10 DIAGNOSIS — M5386 Other specified dorsopathies, lumbar region: Secondary | ICD-10-CM | POA: Diagnosis not present

## 2021-01-10 DIAGNOSIS — M9902 Segmental and somatic dysfunction of thoracic region: Secondary | ICD-10-CM | POA: Diagnosis not present

## 2021-01-10 DIAGNOSIS — M9903 Segmental and somatic dysfunction of lumbar region: Secondary | ICD-10-CM | POA: Diagnosis not present

## 2021-01-10 NOTE — Telephone Encounter (Signed)
Called [phone] and left a message we tried to reach pt for a follow up call. maw

## 2021-01-10 NOTE — Telephone Encounter (Signed)
LVM

## 2021-01-14 ENCOUNTER — Encounter: Payer: Self-pay | Admitting: Gastroenterology

## 2021-02-07 DIAGNOSIS — M9908 Segmental and somatic dysfunction of rib cage: Secondary | ICD-10-CM | POA: Diagnosis not present

## 2021-02-07 DIAGNOSIS — M9902 Segmental and somatic dysfunction of thoracic region: Secondary | ICD-10-CM | POA: Diagnosis not present

## 2021-02-07 DIAGNOSIS — M9903 Segmental and somatic dysfunction of lumbar region: Secondary | ICD-10-CM | POA: Diagnosis not present

## 2021-02-07 DIAGNOSIS — M5386 Other specified dorsopathies, lumbar region: Secondary | ICD-10-CM | POA: Diagnosis not present

## 2021-03-14 DIAGNOSIS — M5386 Other specified dorsopathies, lumbar region: Secondary | ICD-10-CM | POA: Diagnosis not present

## 2021-03-14 DIAGNOSIS — M9908 Segmental and somatic dysfunction of rib cage: Secondary | ICD-10-CM | POA: Diagnosis not present

## 2021-03-14 DIAGNOSIS — M9902 Segmental and somatic dysfunction of thoracic region: Secondary | ICD-10-CM | POA: Diagnosis not present

## 2021-03-14 DIAGNOSIS — M9903 Segmental and somatic dysfunction of lumbar region: Secondary | ICD-10-CM | POA: Diagnosis not present

## 2021-04-06 ENCOUNTER — Encounter: Payer: Self-pay | Admitting: *Deleted

## 2021-04-19 DIAGNOSIS — M9903 Segmental and somatic dysfunction of lumbar region: Secondary | ICD-10-CM | POA: Diagnosis not present

## 2021-04-19 DIAGNOSIS — M9902 Segmental and somatic dysfunction of thoracic region: Secondary | ICD-10-CM | POA: Diagnosis not present

## 2021-04-19 DIAGNOSIS — M5386 Other specified dorsopathies, lumbar region: Secondary | ICD-10-CM | POA: Diagnosis not present

## 2021-04-19 DIAGNOSIS — M9908 Segmental and somatic dysfunction of rib cage: Secondary | ICD-10-CM | POA: Diagnosis not present

## 2021-04-21 ENCOUNTER — Encounter: Payer: BC Managed Care – PPO | Admitting: Family Medicine

## 2021-04-26 ENCOUNTER — Ambulatory Visit (INDEPENDENT_AMBULATORY_CARE_PROVIDER_SITE_OTHER): Payer: BC Managed Care – PPO | Admitting: Family Medicine

## 2021-04-26 ENCOUNTER — Other Ambulatory Visit: Payer: Self-pay

## 2021-04-26 ENCOUNTER — Encounter: Payer: Self-pay | Admitting: Family Medicine

## 2021-04-26 VITALS — BP 102/70 | HR 81 | Temp 98.6°F | Resp 19 | Ht 66.0 in | Wt 162.8 lb

## 2021-04-26 DIAGNOSIS — Z0000 Encounter for general adult medical examination without abnormal findings: Secondary | ICD-10-CM | POA: Diagnosis not present

## 2021-04-26 DIAGNOSIS — F339 Major depressive disorder, recurrent, unspecified: Secondary | ICD-10-CM | POA: Diagnosis not present

## 2021-04-26 DIAGNOSIS — E559 Vitamin D deficiency, unspecified: Secondary | ICD-10-CM | POA: Diagnosis not present

## 2021-04-26 DIAGNOSIS — E785 Hyperlipidemia, unspecified: Secondary | ICD-10-CM

## 2021-04-26 LAB — HEPATIC FUNCTION PANEL
ALT: 18 U/L (ref 0–35)
AST: 13 U/L (ref 0–37)
Albumin: 4.4 g/dL (ref 3.5–5.2)
Alkaline Phosphatase: 80 U/L (ref 39–117)
Bilirubin, Direct: 0.1 mg/dL (ref 0.0–0.3)
Total Bilirubin: 0.4 mg/dL (ref 0.2–1.2)
Total Protein: 6.8 g/dL (ref 6.0–8.3)

## 2021-04-26 LAB — BASIC METABOLIC PANEL
BUN: 11 mg/dL (ref 6–23)
CO2: 26 mEq/L (ref 19–32)
Calcium: 8.9 mg/dL (ref 8.4–10.5)
Chloride: 101 mEq/L (ref 96–112)
Creatinine, Ser: 0.69 mg/dL (ref 0.40–1.20)
GFR: 104.22 mL/min (ref >60.00)
Glucose, Bld: 90 mg/dL (ref 70–99)
Potassium: 4 mEq/L (ref 3.5–5.1)
Sodium: 136 mEq/L (ref 135–145)

## 2021-04-26 LAB — CBC WITH DIFFERENTIAL/PLATELET
Basophils Absolute: 0.1 10*3/uL (ref 0.0–0.1)
Basophils Relative: 0.8 % (ref 0.0–3.0)
Eosinophils Absolute: 0.2 10*3/uL (ref 0.0–0.7)
Eosinophils Relative: 2.1 % (ref 0.0–5.0)
HCT: 38.5 % (ref 36.0–46.0)
Hemoglobin: 13.2 g/dL (ref 12.0–15.0)
Lymphocytes Relative: 36.5 % (ref 12.0–46.0)
Lymphs Abs: 3.1 10*3/uL (ref 0.7–4.0)
MCHC: 34.3 g/dL (ref 30.0–36.0)
MCV: 87.9 fl (ref 78.0–100.0)
Monocytes Absolute: 0.7 10*3/uL (ref 0.1–1.0)
Monocytes Relative: 8.2 % (ref 3.0–12.0)
Neutro Abs: 4.4 10*3/uL (ref 1.4–7.7)
Neutrophils Relative %: 52.4 % (ref 43.0–77.0)
Platelets: 291 10*3/uL (ref 150.0–400.0)
RBC: 4.38 Mil/uL (ref 3.87–5.11)
RDW: 13.5 % (ref 11.5–15.5)
WBC: 8.5 10*3/uL (ref 4.0–10.5)

## 2021-04-26 LAB — LIPID PANEL
Cholesterol: 187 mg/dL (ref 0–200)
HDL: 46.5 mg/dL (ref >39.00)
LDL Cholesterol: 107 mg/dL — ABNORMAL HIGH (ref 0–99)
NonHDL: 140.82
Total CHOL/HDL Ratio: 4
Triglycerides: 169 mg/dL — ABNORMAL HIGH (ref 0.0–149.0)
VLDL: 33.8 mg/dL (ref 0.0–40.0)

## 2021-04-26 LAB — TSH: TSH: 3.32 u[IU]/mL (ref 0.35–5.50)

## 2021-04-26 LAB — VITAMIN D 25 HYDROXY (VIT D DEFICIENCY, FRACTURES): VITD: 35.25 ng/mL (ref 30.00–100.00)

## 2021-04-26 NOTE — Assessment & Plan Note (Signed)
Pt's PE WNL w/ exception of weight gain.  UTD on mammo, colonoscopy, immunizations.  Has GYN appt scheduled for December.  Check labs.  Anticipatory guidance provided.

## 2021-04-26 NOTE — Patient Instructions (Signed)
Follow up in 1 year or as needed We'll notify you of your lab results and make any changes if needed Continue to work on healthy diet and regular exercise- you can do it! Have GYN send me a copy of their report Call with any questions or concerns Stay Safe!  Stay Healthy! Enjoy your summer!!!!

## 2021-04-26 NOTE — Assessment & Plan Note (Signed)
Check labs and replete prn. 

## 2021-04-26 NOTE — Progress Notes (Signed)
   Subjective:    Patient ID: Alexandra [name], female    DOB: April 25, 1976, 45 y.o.   MRN: [id number]  HPI CPE- UTD on mammo, Tdap, colonoscopy, COVID.  Due for pap- upcoming in December.  No concerns today.  Attempting to exercise more regularly  Reviewed past medical, surgical, family and social histories.   Patient Care Team    Relationship Specialty Notifications Start End  Midge Minium, MD PCP - General   09/21/10   Azucena Fallen, MD Consulting Physician Obstetrics and Gynecology  04/19/20     Health Maintenance  Topic Date Due   COVID-19 Vaccine (3 - Booster for Pfizer series) 06/14/2020   PAP SMEAR-Modifier  10/08/2021 (Originally 08/22/2020)   Hepatitis C Screening  04/26/2022 (Originally 02/04/1993)   INFLUENZA VACCINE  05/09/2021   MAMMOGRAM  10/27/2021   TETANUS/TDAP  12/09/2023   COLONOSCOPY (Pts 45-14yrs Insurance coverage will need to be confirmed)  01/07/2024   HIV Screening  Completed   Pneumococcal Vaccine 98-11 Years old  Aged Out   HPV VACCINES  Aged Out      Review of Systems Patient reports no vision/ hearing changes, adenopathy,fever, weight change,  persistant/recurrent hoarseness , swallowing issues, chest pain, palpitations, edema, persistant/recurrent cough, hemoptysis, dyspnea (rest/exertional/paroxysmal nocturnal), gastrointestinal bleeding (melena, rectal bleeding), abdominal pain, significant heartburn, bowel changes, GU symptoms (dysuria, hematuria, incontinence), Gyn symptoms (abnormal  bleeding, pain),  syncope, focal weakness, memory loss, numbness & tingling, skin/hair/nail changes, abnormal bruising or bleeding, anxiety, or depression.   This visit occurred during the SARS-CoV-2 public health emergency.  Safety protocols were in place, including screening questions prior to the visit, additional usage of staff PPE, and extensive cleaning of exam room while observing appropriate contact time as indicated for disinfecting solutions.       Objective:   Physical Exam General Appearance:    Alert, cooperative, no distress, appears stated age  Head:    Normocephalic, without obvious abnormality, atraumatic  Eyes:    PERRL, conjunctiva/corneas clear, EOM's intact, fundi    benign, both eyes  Ears:    Normal TM's and external ear canals, both ears  Nose:   Deferred due to COVID  Throat:   Neck:   Supple, symmetrical, trachea midline, no adenopathy;    Thyroid: no enlargement/tenderness/nodules  Back:     Symmetric, no curvature, ROM normal, no CVA tenderness  Lungs:     Clear to auscultation bilaterally, respirations unlabored  Chest Wall:    No tenderness or deformity   Heart:    Regular rate and rhythm, S1 and S2 normal, no murmur, rub   or gallop  Breast Exam:    Deferred to GYN  Abdomen:     Soft, non-tender, bowel sounds active all four quadrants,    no masses, no organomegaly  Genitalia:    Deferred to GYN  Rectal:    Extremities:   Extremities normal, atraumatic, no cyanosis or edema  Pulses:   2+ and symmetric all extremities  Skin:   Skin color, texture, turgor normal, no rashes or lesions  Lymph nodes:   Cervical, supraclavicular, and axillary nodes normal  Neurologic:   CNII-XII intact, normal strength, sensation and reflexes    throughout          Assessment & Plan:

## 2021-04-26 NOTE — Assessment & Plan Note (Signed)
Pt has hx of elevated cholesterol.  Attempting to control w/ diet and exercise as well as Red Yeast Rice.  Check labs and determine if statin is needed

## 2021-04-26 NOTE — Assessment & Plan Note (Signed)
Currently asymptomatic.  Not on any medication at this time.  Will continue to follow.

## 2021-05-16 DIAGNOSIS — M9902 Segmental and somatic dysfunction of thoracic region: Secondary | ICD-10-CM | POA: Diagnosis not present

## 2021-05-16 DIAGNOSIS — M9903 Segmental and somatic dysfunction of lumbar region: Secondary | ICD-10-CM | POA: Diagnosis not present

## 2021-05-16 DIAGNOSIS — M9908 Segmental and somatic dysfunction of rib cage: Secondary | ICD-10-CM | POA: Diagnosis not present

## 2021-05-16 DIAGNOSIS — M5386 Other specified dorsopathies, lumbar region: Secondary | ICD-10-CM | POA: Diagnosis not present

## 2021-05-30 ENCOUNTER — Encounter: Payer: Self-pay | Admitting: Family Medicine

## 2021-06-08 ENCOUNTER — Encounter: Payer: Self-pay | Admitting: Family Medicine

## 2021-06-10 ENCOUNTER — Encounter: Payer: Self-pay | Admitting: Registered Nurse

## 2021-06-10 ENCOUNTER — Ambulatory Visit (INDEPENDENT_AMBULATORY_CARE_PROVIDER_SITE_OTHER): Payer: BC Managed Care – PPO | Admitting: Registered Nurse

## 2021-06-10 ENCOUNTER — Other Ambulatory Visit: Payer: Self-pay

## 2021-06-10 VITALS — BP 115/80 | HR 100 | Temp 98.2°F | Resp 17 | Ht 66.0 in | Wt 161.6 lb

## 2021-06-10 DIAGNOSIS — M722 Plantar fascial fibromatosis: Secondary | ICD-10-CM | POA: Diagnosis not present

## 2021-06-10 NOTE — Progress Notes (Signed)
Established Patient Office Visit  Subjective:  Patient ID: Alexandra [name], female    DOB: [birth year]  Age: 45 y.o. MRN: [id number]  CC:  Chief Complaint  Patient presents with   referral to ortho    HPI Alexandra [name] presents for referral   Plantar fasciitis Ongoing most of the year  Self care - stretching, icing, supportive shoes Tried some insoles, limited relief Tried Danskos, limited relief  Not interested in injections but would like custom orthotics.  Past Medical History:  Diagnosis Date   Abnormal Pap smear    cryo 1999   Allergy    Anemia    in college   Anxiety    Asthma    also has current cough,last attack <yr   Basal cell carcinoma of nose    Family history of malignant neoplasm of gastrointestinal tract    Gallbladder attack    Gallstone    GERD (gastroesophageal reflux disease)    Hemorrhoids    Hx: UTI (urinary tract infection)    Hyperlipidemia    borderline high   Melanoma of lower limb ([place])    Sleep apnea    uses cpap    Past Surgical History:  Procedure Laterality Date   BASAL CELL CARCINOMA EXCISION  2021   on nose   CHOLECYSTECTOMY     CRYOTHERAPY     cervical   MELANOMA EXCISION  09/17/2012   Procedure: MELANOMA EXCISION;  Surgeon: Imogene Burn. Georgette Dover, MD;  Location: [facility];  Service: General;  Laterality: Left;  wide excision of meanoma-posterior left leg    Family History  Problem Relation Age of Onset   Hypertension Father    Diabetes Father    Hyperlipidemia Father    Colon polyps Father    Breast cancer Maternal Grandmother    Heart disease Maternal Grandfather    Diabetes Paternal Grandfather    Colon cancer Paternal Grandfather        age of dx unknown   Mental illness Mother        multiple personality disorder   Depression Mother    Esophageal cancer Neg Hx    Stomach cancer Neg Hx    Rectal cancer Neg Hx     Social History   Socioeconomic History   Marital status: Married    Spouse name: Rodman Key    Number of children: 2   Years of education: college   Highest education level: Not on file  Occupational History   Occupation: Geophysicist/field seismologist    Comment: self-employed (weddings)  Tobacco Use   Smoking status: Never   Smokeless tobacco: Never  Vaping Use   Vaping Use: Never used  Substance and Sexual Activity   Alcohol use: No    Alcohol/week: 0.0 standard drinks   Drug use: No   Sexual activity: Yes    Partners: Male    Birth control/protection: None    Comment: Husband s/p vasectomy  Other Topics Concern   Not on file  Social History Narrative   No caffeine.    Lives with her husband and their two children.   Minimal contact with her mother.   Father lives in [place], [place].   One brother is in [place], [place], and one in [place], [place].   Social Determinants of Health   Financial Resource Strain: Not on file  Food Insecurity: Not on file  Transportation Needs: Not on file  Physical Activity: Not on file  Stress: Not on file  Social Connections: Not on file  Intimate Partner Violence: Not on file    Outpatient Medications Prior to Visit  Medication Sig Dispense Refill   Cholecalciferol (VITAMIN D3 PO) Take by mouth.     COENZYME Q10 PO Take by mouth.     loratadine (CLARITIN) 10 MG tablet Take 10 mg by mouth daily.     Red Yeast Rice Extract (RED YEAST RICE PO) Take by mouth.     Facility-Administered Medications Prior to Visit  Medication Dose Route Frequency Provider Last Rate Last Admin   0.9 %  sodium chloride infusion  500 mL Intravenous Once Nandigam, Venia Minks, MD        Allergies  Allergen Reactions   Sulfamethoxazole-Trimethoprim     REACTION: hives   Adhesive [Tape] Rash    Steri strips    ROS Review of Systems  Constitutional: Negative.   HENT: Negative.    Eyes: Negative.   Respiratory: Negative.    Cardiovascular: Negative.   Gastrointestinal: Negative.   Genitourinary: Negative.   Musculoskeletal: Negative.        Bilateral food pain under  each heel. Worst in mornings first steps out of bed. Better with shoes on  Skin: Negative.   Neurological: Negative.   Psychiatric/Behavioral: Negative.    All other systems reviewed and are negative.    Objective:    Physical Exam Vitals and nursing note reviewed.  Constitutional:      General: She is not in acute distress.    Appearance: Normal appearance. She is normal weight. She is not ill-appearing, toxic-appearing or diaphoretic.  Cardiovascular:     Rate and Rhythm: Normal rate and regular rhythm.     Heart sounds: Normal heart sounds. No murmur heard.   No friction rub. No gallop.  Pulmonary:     Effort: Pulmonary effort is normal. No respiratory distress.     Breath sounds: Normal breath sounds. No stridor. No wheezing, rhonchi or rales.  Chest:     Chest wall: No tenderness.  Skin:    General: Skin is warm and dry.  Neurological:     General: No focal deficit present.     Mental Status: She is alert and oriented to person, place, and time. Mental status is at baseline.  Psychiatric:        Mood and Affect: Mood normal.        Behavior: Behavior normal.        Thought Content: Thought content normal.        Judgment: Judgment normal.    BP 115/80   Pulse 100   Temp 98.2 F (36.8 C) (Temporal)   Resp 17   Ht '5\' 6"'$  (1.676 m)   Wt 161 lb 9.6 oz (73.3 kg)   SpO2 98%   BMI 26.08 kg/m  Wt Readings from Last 3 Encounters:  06/10/21 161 lb 9.6 oz (73.3 kg)  04/26/21 162 lb 12.8 oz (73.8 kg)  01/06/21 160 lb (72.6 kg)     Health Maintenance Due  Topic Date Due   INFLUENZA VACCINE  Never done    There are no preventive care reminders to display for this patient.  Lab Results  Component Value Date   TSH 3.32 04/26/2021   Lab Results  Component Value Date   WBC 8.5 04/26/2021   HGB 13.2 04/26/2021   HCT 38.5 04/26/2021   MCV 87.9 04/26/2021   PLT 291.0 04/26/2021   Lab Results  Component Value Date   NA 136 04/26/2021   K 4.0 04/26/2021  CO2  26 04/26/2021   GLUCOSE 90 04/26/2021   BUN 11 04/26/2021   CREATININE 0.69 04/26/2021   BILITOT 0.4 04/26/2021   ALKPHOS 80 04/26/2021   AST 13 04/26/2021   ALT 18 04/26/2021   PROT 6.8 04/26/2021   ALBUMIN 4.4 04/26/2021   CALCIUM 8.9 04/26/2021   GFR 104.22 04/26/2021   Lab Results  Component Value Date   CHOL 187 04/26/2021   Lab Results  Component Value Date   HDL 46.50 04/26/2021   Lab Results  Component Value Date   LDLCALC 107 (H) 04/26/2021   Lab Results  Component Value Date   TRIG 169.0 (H) 04/26/2021   Lab Results  Component Value Date   CHOLHDL 4 04/26/2021   Lab Results  Component Value Date   HGBA1C 5.4 07/11/2016      Assessment & Plan:   Problem List Items Addressed This Visit   None Visit Diagnoses     Bilateral plantar fasciitis    -  Primary   Relevant Orders   Ambulatory referral to Podiatry       No orders of the defined types were placed in this encounter.   Follow-up: Return if symptoms worsen or fail to improve.   PLAN Refer to podiatry She is doing great with home care. Encouraged her to continue [facility] [place] to use OTC analgesics prn Patient encouraged to call clinic with any questions, comments, or concerns.  Maximiano Coss, NP

## 2021-06-10 NOTE — Patient Instructions (Signed)
Ms. Shaya Caouette to meet you  Referral placed - they'll call you soon  Let me know if you need anything  Thanks,  Denice Paradise

## 2021-06-16 ENCOUNTER — Telehealth: Payer: BC Managed Care – PPO | Admitting: Nurse Practitioner

## 2021-06-16 ENCOUNTER — Encounter: Payer: Self-pay | Admitting: Nurse Practitioner

## 2021-06-16 DIAGNOSIS — Z20818 Contact with and (suspected) exposure to other bacterial communicable diseases: Secondary | ICD-10-CM

## 2021-06-16 MED ORDER — AZITHROMYCIN 250 MG PO TABS: ORAL_TABLET | ORAL | 0 refills | Status: AC

## 2021-06-16 NOTE — Progress Notes (Signed)
Virtual Visit Consent   Alexandra [name], you are scheduled for a virtual visit with a [facility] provider today.     Just as with appointments in the office, your consent must be obtained to participate.  Your consent will be active for this visit and any virtual visit you may have with one of our providers in the next 365 days.     If you have a MyChart account, a copy of this consent can be sent to you electronically.  All virtual visits are billed to your insurance company just like a traditional visit in the office.    As this is a virtual visit, video technology does not allow for your provider to perform a traditional examination.  This may limit your provider's ability to fully assess your condition.  If your provider identifies any concerns that need to be evaluated in person or the need to arrange testing (such as labs, EKG, etc.), we will make arrangements to do so.     Although advances in technology are sophisticated, we cannot ensure that it will always work on either your end or our end.  If the connection with a video visit is poor, the visit may have to be switched to a telephone visit.  With either a video or telephone visit, we are not always able to ensure that we have a secure connection.     I need to obtain your verbal consent now.   Are you willing to proceed with your visit today?    Alexandra [name] has provided verbal consent on 06/16/2021 for a virtual visit (video or telephone).   Apolonio Schneiders, FNP   Date: 06/16/2021 5:17 PM   Virtual Visit via Video Note   I, Apolonio Schneiders, connected with  Alexandra [name]  ([id number], 01-17-1976) on 06/16/21 at  5:45 PM EDT by a video-enabled telemedicine application and verified that I am speaking with the correct person using two identifiers.  Location: Patient: Virtual Visit Location Patient: Home Provider: Virtual Visit Location Provider: Office/Clinic   I discussed the limitations of evaluation and management by  telemedicine and the availability of in person appointments. The patient expressed understanding and agreed to proceed.    History of Present Illness: Alexandra [name] is a 45 y.o. who identifies as a female who was assigned female at birth, and is being seen today for a sore throat and ear pain. Denies a cough or nasal congestion. Does feel like she is starting to get a fever. Her daughter was seen 8 days ago and treated for strep- has since developed a cough as well.   Daughter tested negative for strep 3 times. Patient has not tested yet.   Currently has pain when swallowing, referred pain to ears.  Has allergies and did take allergy medications today without relief.  Problems:  Patient Active Problem List   Diagnosis Date Noted   OSA (obstructive sleep apnea) 08/13/2020   Hyperlipidemia 04/16/2019   Vitamin D deficiency 04/16/2019   Depression, recurrent ([facility]) 04/16/2019   GERD (gastroesophageal reflux disease) 05/14/2014   History of melanoma 08/29/2012   Physical exam 05/27/2012   Family history of malignant neoplasm of gastrointestinal tract 10/24/2011   ASTHMA 12/18/2008    Allergies:  Allergies  Allergen Reactions   Sulfamethoxazole-Trimethoprim     REACTION: hives   Adhesive [Tape] Rash    Steri strips   Medications:  Current Outpatient Medications:    Cholecalciferol (VITAMIN D3 PO), Take by mouth., Disp: ,  Rfl:    COENZYME Q10 PO, Take by mouth., Disp: , Rfl:    loratadine (CLARITIN) 10 MG tablet, Take 10 mg by mouth daily., Disp: , Rfl:    Red Yeast Rice Extract (RED YEAST RICE PO), Take by mouth., Disp: , Rfl:   Current Facility-Administered Medications:    0.9 %  sodium chloride infusion, 500 mL, Intravenous, Once, Nandigam, Kavitha V, MD  Observations/Objective: Patient is well-developed, well-nourished in no acute distress.  Resting comfortably  at home.  Head is normocephalic, atraumatic.  No labored breathing.  Speech is clear and coherent with  logical content.  Patient is alert and oriented at baseline.    Assessment and Plan: 1. Strep throat exposure  - azithromycin (ZITHROMAX) 250 MG tablet; Take 2 tablets on day 1, then 1 tablet daily on days 2 through 5  Dispense: 6 tablet; Refill: 0    Advised OTC decongestant, warm salt water gargles and tylenol/advil for relief of pain.   Discussed potential for this to be viral, and also encouraged COVID testing.  Given exposure to strep patient will try OTC if no relief and no congestion develops and ST and fever persist she will start abx as discussed.  Follow Up Instructions: I discussed the assessment and treatment plan with the patient. The patient was provided an opportunity to ask questions and all were answered. The patient agreed with the plan and demonstrated an understanding of the instructions.  A copy of instructions were sent to the patient via MyChart.  The patient was advised to call back or seek an in-person evaluation if the symptoms worsen or if the condition fails to improve as anticipated.  Time:  I spent 10 minutes with the patient via telehealth technology discussing the above problems/concerns.    Apolonio Schneiders, FNP

## 2021-06-22 ENCOUNTER — Ambulatory Visit (INDEPENDENT_AMBULATORY_CARE_PROVIDER_SITE_OTHER): Payer: BC Managed Care – PPO | Admitting: Podiatry

## 2021-06-22 ENCOUNTER — Other Ambulatory Visit: Payer: Self-pay

## 2021-06-22 ENCOUNTER — Ambulatory Visit (INDEPENDENT_AMBULATORY_CARE_PROVIDER_SITE_OTHER): Payer: BC Managed Care – PPO

## 2021-06-22 DIAGNOSIS — M722 Plantar fascial fibromatosis: Secondary | ICD-10-CM

## 2021-06-22 MED ORDER — DICLOFENAC SODIUM 75 MG PO TBEC: 75.0000 mg | DELAYED_RELEASE_TABLET | Freq: Two times a day (BID) | ORAL | 2 refills | Status: DC

## 2021-06-22 NOTE — Patient Instructions (Signed)

## 2021-06-22 NOTE — Progress Notes (Signed)
Subjective:   Patient ID: Alexandra [name], female   DOB: 45 y.o.   MRN: [id number]   HPI Patient presents with discomfort in the left plantar fascial.  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  She is interested in orthotics and other treatments that might be helpful and would like not to get an injection currently.  Patient does not smoke likes to be active   Review of Systems  All other systems reviewed and are negative.      Objective:  Physical Exam Vitals and nursing note reviewed.  Constitutional:      Appearance: She is well-developed.  Pulmonary:     Effort: Pulmonary effort is normal.  Musculoskeletal:        General: Normal range of motion.  Skin:    General: Skin is warm.  Neurological:     Mental Status: She is alert.    Neurovascular status intact muscle strength adequate range of motion adequate with quite a bit of discomfort in the plantar fashion of the left heel at the insertional point of the tendon into the calcaneus.  Patient is found to have good digital perfusion well oriented x3 with moderate flattening of the arch structure     Assessment:  Acute Planter fasciitis left with inflammation fluid mechanical in nature 39-monthhistory     Plan:  H&P reviewed condition we will hold off an injection I did apply fascial brace to lift up the arch instructed on physical therapy placed on diclofenac and casted for functional orthotic devices.  May require injection in future  X-rays indicate small spur no indication stress fracture arthritis

## 2021-07-04 DIAGNOSIS — M5386 Other specified dorsopathies, lumbar region: Secondary | ICD-10-CM | POA: Diagnosis not present

## 2021-07-04 DIAGNOSIS — M9908 Segmental and somatic dysfunction of rib cage: Secondary | ICD-10-CM | POA: Diagnosis not present

## 2021-07-04 DIAGNOSIS — M9902 Segmental and somatic dysfunction of thoracic region: Secondary | ICD-10-CM | POA: Diagnosis not present

## 2021-07-04 DIAGNOSIS — M9903 Segmental and somatic dysfunction of lumbar region: Secondary | ICD-10-CM | POA: Diagnosis not present

## 2021-07-05 DIAGNOSIS — L821 Other seborrheic keratosis: Secondary | ICD-10-CM | POA: Diagnosis not present

## 2021-07-05 DIAGNOSIS — D225 Melanocytic nevi of trunk: Secondary | ICD-10-CM | POA: Diagnosis not present

## 2021-07-05 DIAGNOSIS — Z85820 Personal history of malignant melanoma of skin: Secondary | ICD-10-CM | POA: Diagnosis not present

## 2021-07-05 DIAGNOSIS — L814 Other melanin hyperpigmentation: Secondary | ICD-10-CM | POA: Diagnosis not present

## 2021-08-01 DIAGNOSIS — M9903 Segmental and somatic dysfunction of lumbar region: Secondary | ICD-10-CM | POA: Diagnosis not present

## 2021-08-01 DIAGNOSIS — M5386 Other specified dorsopathies, lumbar region: Secondary | ICD-10-CM | POA: Diagnosis not present

## 2021-08-01 DIAGNOSIS — M9908 Segmental and somatic dysfunction of rib cage: Secondary | ICD-10-CM | POA: Diagnosis not present

## 2021-08-01 DIAGNOSIS — M9902 Segmental and somatic dysfunction of thoracic region: Secondary | ICD-10-CM | POA: Diagnosis not present

## 2021-08-03 ENCOUNTER — Ambulatory Visit (INDEPENDENT_AMBULATORY_CARE_PROVIDER_SITE_OTHER): Payer: BC Managed Care – PPO | Admitting: Podiatry

## 2021-08-03 ENCOUNTER — Other Ambulatory Visit: Payer: Self-pay

## 2021-08-03 DIAGNOSIS — M722 Plantar fascial fibromatosis: Secondary | ICD-10-CM

## 2021-08-03 NOTE — Patient Instructions (Signed)

## 2021-08-03 NOTE — Progress Notes (Signed)
Patient presents today for orthotic pick up. Patient voices no new complaints.  Orthotics were fitted to patient's feet. No discomfort and no rubbing. Patient satisfied with the orthotics.  Orthotics were dispensed to patient with instructions for break in wear and to call the office with any concerns or questions. 

## 2021-08-09 DIAGNOSIS — L739 Follicular disorder, unspecified: Secondary | ICD-10-CM | POA: Diagnosis not present

## 2021-08-15 DIAGNOSIS — F419 Anxiety disorder, unspecified: Secondary | ICD-10-CM | POA: Diagnosis not present

## 2021-08-29 ENCOUNTER — Encounter: Payer: Self-pay | Admitting: Family Medicine

## 2021-08-29 DIAGNOSIS — M9908 Segmental and somatic dysfunction of rib cage: Secondary | ICD-10-CM | POA: Diagnosis not present

## 2021-08-29 DIAGNOSIS — M9903 Segmental and somatic dysfunction of lumbar region: Secondary | ICD-10-CM | POA: Diagnosis not present

## 2021-08-29 DIAGNOSIS — M5386 Other specified dorsopathies, lumbar region: Secondary | ICD-10-CM | POA: Diagnosis not present

## 2021-08-29 DIAGNOSIS — M9902 Segmental and somatic dysfunction of thoracic region: Secondary | ICD-10-CM | POA: Diagnosis not present

## 2021-08-30 ENCOUNTER — Other Ambulatory Visit: Payer: Self-pay

## 2021-08-30 MED ORDER — ALBUTEROL SULFATE HFA 108 (90 BASE) MCG/ACT IN AERS: 2.0000 | INHALATION_SPRAY | Freq: Four times a day (QID) | RESPIRATORY_TRACT | 1 refills | Status: DC | PRN

## 2021-09-05 ENCOUNTER — Other Ambulatory Visit: Payer: Self-pay | Admitting: Podiatry

## 2021-09-05 NOTE — Telephone Encounter (Signed)
Please advise 

## 2021-09-09 DIAGNOSIS — F419 Anxiety disorder, unspecified: Secondary | ICD-10-CM | POA: Diagnosis not present

## 2021-09-26 DIAGNOSIS — M9902 Segmental and somatic dysfunction of thoracic region: Secondary | ICD-10-CM | POA: Diagnosis not present

## 2021-09-26 DIAGNOSIS — M9903 Segmental and somatic dysfunction of lumbar region: Secondary | ICD-10-CM | POA: Diagnosis not present

## 2021-09-26 DIAGNOSIS — M9908 Segmental and somatic dysfunction of rib cage: Secondary | ICD-10-CM | POA: Diagnosis not present

## 2021-09-26 DIAGNOSIS — M5386 Other specified dorsopathies, lumbar region: Secondary | ICD-10-CM | POA: Diagnosis not present

## 2021-10-17 DIAGNOSIS — M9902 Segmental and somatic dysfunction of thoracic region: Secondary | ICD-10-CM | POA: Diagnosis not present

## 2021-10-17 DIAGNOSIS — M9908 Segmental and somatic dysfunction of rib cage: Secondary | ICD-10-CM | POA: Diagnosis not present

## 2021-10-17 DIAGNOSIS — M9903 Segmental and somatic dysfunction of lumbar region: Secondary | ICD-10-CM | POA: Diagnosis not present

## 2021-10-17 DIAGNOSIS — M5386 Other specified dorsopathies, lumbar region: Secondary | ICD-10-CM | POA: Diagnosis not present

## 2021-10-21 DIAGNOSIS — F419 Anxiety disorder, unspecified: Secondary | ICD-10-CM | POA: Diagnosis not present

## 2021-10-25 ENCOUNTER — Ambulatory Visit: Payer: BC Managed Care – PPO | Admitting: Primary Care

## 2021-10-26 ENCOUNTER — Encounter: Payer: Self-pay | Admitting: Nurse Practitioner

## 2021-10-26 ENCOUNTER — Other Ambulatory Visit: Payer: Self-pay

## 2021-10-26 ENCOUNTER — Ambulatory Visit (INDEPENDENT_AMBULATORY_CARE_PROVIDER_SITE_OTHER): Payer: BC Managed Care – PPO | Admitting: Nurse Practitioner

## 2021-10-26 VITALS — BP 118/70 | HR 102 | Temp 98.4°F | Ht 65.0 in | Wt 164.0 lb

## 2021-10-26 DIAGNOSIS — G4733 Obstructive sleep apnea (adult) (pediatric): Secondary | ICD-10-CM

## 2021-10-26 NOTE — Progress Notes (Signed)
Reviewed and agree with assessment/plan. ° ° °Kyler Germer, MD °[facility] °10/26/2021, 7:08 PM °Pager:  [phone] ° °

## 2021-10-26 NOTE — Progress Notes (Signed)
@Patient  ID: Alexandra [name], female    DOB: [birth year], 46 y.o.   MRN: [id number]  Chief Complaint  Patient presents with   Follow-up    No c/o     Referring provider: Midge Minium, MD  HPI: 46 year old female, never smoker followed for mild obstructive sleep apnea on CPAP.  She is a patient of Dr. Juanetta Gosling and was last seen in office on 10/21/2020 by Warner Mccreedy NP.  Past medical history significant for asthma, GERD, hyperlipidemia, depression.   TEST/EVENTS:  08/04/2020 HST: AHI 12.2/h, SPO2 low 89%  10/21/2020: OV with back NP.  Recently started CPAP therapy with excellent compliance.  Using nasal pillows mask.  No significant leaks.  Continued CPAP therapy with follow-up in 1 year.  10/26/2021: Today-yearly follow-up Patient presents today for annual follow-up of mild obstructive sleep apnea on CPAP.  She denies any daytime fatigue symptoms and reports feeling significantly better since being on CPAP therapy.  Her breathing is stable and she does not have any issues with shortness of breath, wheezing or cough related to her asthma.  She denies orthopnea, PND, chest pain or lower extremity swelling.  She denies drowsy driving or narcolepsy.  She continues on CPAP therapy nightly at 4 to 20 cmH2O without any issues.  She changes out her equipment as directed.  She does get her own supplies and does not go through medical supply company.  Overall she feels well and offers no further complaints  [facility] 1  Airview download -CPAP auto 4-20 cmH2O 89 out of 90 days with 94% greater than 4 hours Average use 7 hours 21 minutes Pressure 95th 8.2 cmH2O Leaks 95th 0.9 L/min AHI 0.2/hr  Allergies  Allergen Reactions   Sulfamethoxazole-Trimethoprim     REACTION: hives   Adhesive [Tape] Rash    Steri strips    Immunization History  Administered Date(s) Administered   Hepatitis B, adult 12/25/2017, 01/28/2018   Hepb-cpg 08/11/2020   Influenza-Unspecified 08/26/2021   PFIZER(Purple  Top)SARS-COV-2 Vaccination 12/22/2019, 01/13/2020, 09/08/2020   Pfizer Covid-19 Vaccine Bivalent Booster 54yrs & up 08/19/2021   Rho (D) Immune Globulin 07/09/2011   Tdap 12/08/2013    Past Medical History:  Diagnosis Date   Abnormal Pap smear    cryo 1999   Allergy    Anemia    in college   Anxiety    Asthma    also has current cough,last attack <yr   Basal cell carcinoma of nose    Family history of malignant neoplasm of gastrointestinal tract    Gallbladder attack    Gallstone    GERD (gastroesophageal reflux disease)    Hemorrhoids    Hx: UTI (urinary tract infection)    Hyperlipidemia    borderline high   Melanoma of lower limb (HCC)    Sleep apnea    uses cpap    Tobacco History: Social History   Tobacco Use  Smoking Status Never  Smokeless Tobacco Never   Counseling given: Not Answered   Outpatient Medications Prior to Visit  Medication Sig Dispense Refill   albuterol (PROAIR HFA) 108 (90 Base) MCG/ACT inhaler Inhale 2 puffs into the lungs every 6 (six) hours as needed for wheezing. 1 each 1   Cholecalciferol (VITAMIN D3 PO) Take by mouth.     COENZYME Q10 PO Take by mouth.     diclofenac (VOLTAREN) 75 MG EC tablet TAKE 1 TABLET BY MOUTH TWICE A DAY 50 tablet 2   loratadine (CLARITIN) 10 MG tablet Take 10  mg by mouth daily.     Red Yeast Rice Extract (RED YEAST RICE PO) Take by mouth.     Facility-Administered Medications Prior to Visit  Medication Dose Route Frequency Provider Last Rate Last Admin   0.9 %  sodium chloride infusion  500 mL Intravenous Once Nandigam, Kavitha V, MD         Review of Systems:   Constitutional: No weight loss or gain, night sweats, fevers, chills, fatigue, or lassitude. HEENT: No headaches, difficulty swallowing, tooth/dental problems, or sore throat. No sneezing, itching, ear ache, nasal congestion, or post nasal drip CV:  No chest pain, orthopnea, PND, swelling in lower extremities, anasarca, dizziness, palpitations,  syncope Resp: No shortness of breath with exertion or at rest. No excess mucus or change in color of mucus. No productive or non-productive. No hemoptysis. No wheezing.  No chest wall deformity GI:  No heartburn, indigestion, abdominal pain, nausea, vomiting, diarrhea, change in bowel habits, loss of appetite, bloody stools.  GU: No dysuria, change in color of urine, urgency or frequency.  No flank pain, no hematuria  Skin: No rash, lesions, ulcerations MSK:  No joint pain or swelling.  No decreased range of motion.  No back pain. Neuro: No dizziness or lightheadedness.  Psych: No depression or anxiety. Mood stable.     Physical Exam:  BP 118/70 (BP Location: Right Arm, Patient Position: Sitting, Cuff Size: Normal)    Pulse (!) 102    Temp 98.4 F (36.9 C) (Oral)    Ht 5\' 5"  (1.651 m)    Wt 164 lb (74.4 kg)    SpO2 97%    BMI 27.29 kg/m   GEN: Pleasant, interactive, well-nourished, well-appearing; in no acute distress. HEENT:  Normocephalic and atraumatic. EACs patent bilaterally. TM pearly gray with present light reflex bilaterally. PERRLA. Sclera white. Nasal turbinates pink, moist and patent bilaterally. No rhinorrhea present. Oropharynx pink and moist, without exudate or edema. No lesions, ulcerations, or postnasal drip.  NECK:  Supple w/ fair ROM. No JVD present. Normal carotid impulses w/o bruits. Thyroid symmetrical with no goiter or nodules palpated. No lymphadenopathy.   CV: RRR, no m/r/g, no peripheral edema. Pulses intact, +2 bilaterally. No cyanosis, pallor or clubbing. PULMONARY:  Unlabored, regular breathing. Clear bilaterally A&P w/o wheezes/rales/rhonchi. No accessory muscle use. No dullness to percussion. GI: BS present and normoactive. Soft, non-tender to palpation. No organomegaly or masses detected. No CVA tenderness. MSK: No erythema, warmth or tenderness. Cap refil <2 sec all extrem. No deformities or joint swelling noted.  Neuro: A/Ox3. No focal deficits noted.   Skin:  Warm, no lesions or rashe Psych: Normal affect and behavior. Judgement and thought content appropriate.     Lab Results:  CBC    Component Value Date/Time   WBC 8.5 04/26/2021 0806   RBC 4.38 04/26/2021 0806   HGB 13.2 04/26/2021 0806   HCT 38.5 04/26/2021 0806   PLT 291.0 04/26/2021 0806   MCV 87.9 04/26/2021 0806   MCV 90.1 05/08/2014 1423   MCH 29.6 04/16/2019 1611   MCHC 34.3 04/26/2021 0806   RDW 13.5 04/26/2021 0806   LYMPHSABS 3.1 04/26/2021 0806   MONOABS 0.7 04/26/2021 0806   EOSABS 0.2 04/26/2021 0806   BASOSABS 0.1 04/26/2021 0806    BMET    Component Value Date/Time   NA 136 04/26/2021 0806   NA 141 07/11/2016 0000   K 4.0 04/26/2021 0806   CL 101 04/26/2021 0806   CO2 26 04/26/2021 0806  GLUCOSE 90 04/26/2021 0806   BUN 11 04/26/2021 0806   BUN 12 07/11/2016 0000   CREATININE 0.69 04/26/2021 0806   CREATININE 0.66 04/16/2019 1611   CALCIUM 8.9 04/26/2021 0806   GFRNONAA >90 09/16/2012 0833   GFRAA >90 09/16/2012 0833    BNP No results found for: BNP   Imaging:  No results found.    No flowsheet data found.  No results found for: NITRICOXIDE      Assessment & Plan:   OSA (obstructive sleep apnea) Well-controlled. Excellent compliance with CPAP therapy. Continue CPAP auto 4-20 cmH2O nightly. F/u one year.   Patient Instructions  Continue to use CPAP auto 4-20 cmH2O every night, minimum of 6 hours a night.  Change equipment every 30 days. Wash your tubing with warm soap and water daily, hang to dry. Wash humidifier portion weekly.  Maintain clean equipment, as directed by home health agency.  Be aware of reduced alertness and do not drive or operate heavy machinery if experiencing this or drowsiness.  Exercise encouraged, as tolerated. Healthy weight management discussed.  Avoid or decrease alcohol consumption and medications that make you more sleepy, if possible. Notify if persistent daytime sleepiness occurs even with consistent  use of CPAP.   We discussed how untreated sleep apnea puts an individual at risk for cardiac arrhthymias, pulm HTN, DM, stroke and increases their risk for daytime accidents.   Follow up in one year with Dr. Halford Chessman or APP. If symptoms do not improve or worsen, please contact office for sooner follow up or seek emergency care.   23 min spent on case with >50% of face to face time  Clayton Bibles, NP 10/26/2021  Pt aware and understands NP's role.

## 2021-10-26 NOTE — Assessment & Plan Note (Addendum)
Well-controlled. Excellent compliance with CPAP therapy. Continue CPAP auto 4-20 cmH2O nightly. F/u one year.   Patient Instructions  Continue to use CPAP auto 4-20 cmH2O every night, minimum of 6 hours a night.  Change equipment every 30 days. Wash your tubing with warm soap and water daily, hang to dry. Wash humidifier portion weekly.  Maintain clean equipment, as directed by home health agency.  Be aware of reduced alertness and do not drive or operate heavy machinery if experiencing this or drowsiness.  Exercise encouraged, as tolerated. Healthy weight management discussed.  Avoid or decrease alcohol consumption and medications that make you more sleepy, if possible. Notify if persistent daytime sleepiness occurs even with consistent use of CPAP.   We discussed how untreated sleep apnea puts an individual at risk for cardiac arrhthymias, pulm HTN, DM, stroke and increases their risk for daytime accidents.   Follow up in one year with Dr. Halford Chessman or APP. If symptoms do not improve or worsen, please contact office for sooner follow up or seek emergency care.

## 2021-10-26 NOTE — Patient Instructions (Signed)
Continue to use CPAP auto 4-20 cmH2O every night, minimum of 6 hours a night.  Change equipment every 30 days. Wash your tubing with warm soap and water daily, hang to dry. Wash humidifier portion weekly.  Maintain clean equipment, as directed by home health agency.  Be aware of reduced alertness and do not drive or operate heavy machinery if experiencing this or drowsiness.  Exercise encouraged, as tolerated. Healthy weight management discussed.  Avoid or decrease alcohol consumption and medications that make you more sleepy, if possible. Notify if persistent daytime sleepiness occurs even with consistent use of CPAP.   We discussed how untreated sleep apnea puts an individual at risk for cardiac arrhthymias, pulm HTN, DM, stroke and increases their risk for daytime accidents.   Follow up in one year with Dr. Halford Chessman or APP. If symptoms do not improve or worsen, please contact office for sooner follow up or seek emergency care.

## 2021-11-07 DIAGNOSIS — M9903 Segmental and somatic dysfunction of lumbar region: Secondary | ICD-10-CM | POA: Diagnosis not present

## 2021-11-07 DIAGNOSIS — M9908 Segmental and somatic dysfunction of rib cage: Secondary | ICD-10-CM | POA: Diagnosis not present

## 2021-11-07 DIAGNOSIS — M9902 Segmental and somatic dysfunction of thoracic region: Secondary | ICD-10-CM | POA: Diagnosis not present

## 2021-11-07 DIAGNOSIS — M5386 Other specified dorsopathies, lumbar region: Secondary | ICD-10-CM | POA: Diagnosis not present

## 2021-11-14 DIAGNOSIS — Z124 Encounter for screening for malignant neoplasm of cervix: Secondary | ICD-10-CM | POA: Diagnosis not present

## 2021-11-14 DIAGNOSIS — Z01411 Encounter for gynecological examination (general) (routine) with abnormal findings: Secondary | ICD-10-CM | POA: Diagnosis not present

## 2021-11-14 DIAGNOSIS — Z01419 Encounter for gynecological examination (general) (routine) without abnormal findings: Secondary | ICD-10-CM | POA: Diagnosis not present

## 2021-11-14 DIAGNOSIS — Z1231 Encounter for screening mammogram for malignant neoplasm of breast: Secondary | ICD-10-CM | POA: Diagnosis not present

## 2021-11-14 LAB — HM PAP SMEAR

## 2021-11-14 LAB — HM MAMMOGRAPHY

## 2021-11-16 DIAGNOSIS — F419 Anxiety disorder, unspecified: Secondary | ICD-10-CM | POA: Diagnosis not present

## 2021-11-28 DIAGNOSIS — M5386 Other specified dorsopathies, lumbar region: Secondary | ICD-10-CM | POA: Diagnosis not present

## 2021-11-28 DIAGNOSIS — M9908 Segmental and somatic dysfunction of rib cage: Secondary | ICD-10-CM | POA: Diagnosis not present

## 2021-11-28 DIAGNOSIS — M9903 Segmental and somatic dysfunction of lumbar region: Secondary | ICD-10-CM | POA: Diagnosis not present

## 2021-11-28 DIAGNOSIS — M9902 Segmental and somatic dysfunction of thoracic region: Secondary | ICD-10-CM | POA: Diagnosis not present

## 2021-12-19 DIAGNOSIS — M9902 Segmental and somatic dysfunction of thoracic region: Secondary | ICD-10-CM | POA: Diagnosis not present

## 2021-12-19 DIAGNOSIS — M9908 Segmental and somatic dysfunction of rib cage: Secondary | ICD-10-CM | POA: Diagnosis not present

## 2021-12-19 DIAGNOSIS — M5386 Other specified dorsopathies, lumbar region: Secondary | ICD-10-CM | POA: Diagnosis not present

## 2021-12-19 DIAGNOSIS — M9903 Segmental and somatic dysfunction of lumbar region: Secondary | ICD-10-CM | POA: Diagnosis not present

## 2021-12-22 ENCOUNTER — Other Ambulatory Visit: Payer: Self-pay | Admitting: Podiatry

## 2021-12-25 NOTE — Telephone Encounter (Signed)
I have not seen her for over 6 months. She needs to make appointment to be evaluated. Can take over the counter advil or alleve until she is seen

## 2021-12-27 DIAGNOSIS — F419 Anxiety disorder, unspecified: Secondary | ICD-10-CM | POA: Diagnosis not present

## 2022-01-08 ENCOUNTER — Encounter: Payer: Self-pay | Admitting: Family Medicine

## 2022-01-23 DIAGNOSIS — M9902 Segmental and somatic dysfunction of thoracic region: Secondary | ICD-10-CM | POA: Diagnosis not present

## 2022-01-23 DIAGNOSIS — M9903 Segmental and somatic dysfunction of lumbar region: Secondary | ICD-10-CM | POA: Diagnosis not present

## 2022-01-23 DIAGNOSIS — M5386 Other specified dorsopathies, lumbar region: Secondary | ICD-10-CM | POA: Diagnosis not present

## 2022-01-23 DIAGNOSIS — M9908 Segmental and somatic dysfunction of rib cage: Secondary | ICD-10-CM | POA: Diagnosis not present

## 2022-01-24 DIAGNOSIS — F419 Anxiety disorder, unspecified: Secondary | ICD-10-CM | POA: Diagnosis not present

## 2022-02-20 DIAGNOSIS — M9902 Segmental and somatic dysfunction of thoracic region: Secondary | ICD-10-CM | POA: Diagnosis not present

## 2022-02-20 DIAGNOSIS — M5386 Other specified dorsopathies, lumbar region: Secondary | ICD-10-CM | POA: Diagnosis not present

## 2022-02-20 DIAGNOSIS — M9903 Segmental and somatic dysfunction of lumbar region: Secondary | ICD-10-CM | POA: Diagnosis not present

## 2022-02-20 DIAGNOSIS — M9908 Segmental and somatic dysfunction of rib cage: Secondary | ICD-10-CM | POA: Diagnosis not present

## 2022-02-22 DIAGNOSIS — F419 Anxiety disorder, unspecified: Secondary | ICD-10-CM | POA: Diagnosis not present

## 2022-03-16 ENCOUNTER — Other Ambulatory Visit: Payer: Self-pay | Admitting: Podiatry

## 2022-03-18 ENCOUNTER — Other Ambulatory Visit: Payer: Self-pay | Admitting: Family Medicine

## 2022-03-22 DIAGNOSIS — F419 Anxiety disorder, unspecified: Secondary | ICD-10-CM | POA: Diagnosis not present

## 2022-03-27 DIAGNOSIS — M5386 Other specified dorsopathies, lumbar region: Secondary | ICD-10-CM | POA: Diagnosis not present

## 2022-03-27 DIAGNOSIS — M9902 Segmental and somatic dysfunction of thoracic region: Secondary | ICD-10-CM | POA: Diagnosis not present

## 2022-03-27 DIAGNOSIS — M9903 Segmental and somatic dysfunction of lumbar region: Secondary | ICD-10-CM | POA: Diagnosis not present

## 2022-03-27 DIAGNOSIS — M9908 Segmental and somatic dysfunction of rib cage: Secondary | ICD-10-CM | POA: Diagnosis not present

## 2022-04-18 DIAGNOSIS — F419 Anxiety disorder, unspecified: Secondary | ICD-10-CM | POA: Diagnosis not present

## 2022-04-24 DIAGNOSIS — M9902 Segmental and somatic dysfunction of thoracic region: Secondary | ICD-10-CM | POA: Diagnosis not present

## 2022-04-24 DIAGNOSIS — M9908 Segmental and somatic dysfunction of rib cage: Secondary | ICD-10-CM | POA: Diagnosis not present

## 2022-04-24 DIAGNOSIS — M5386 Other specified dorsopathies, lumbar region: Secondary | ICD-10-CM | POA: Diagnosis not present

## 2022-04-24 DIAGNOSIS — M9903 Segmental and somatic dysfunction of lumbar region: Secondary | ICD-10-CM | POA: Diagnosis not present

## 2022-04-27 ENCOUNTER — Encounter: Payer: BC Managed Care – PPO | Admitting: Family Medicine

## 2022-05-01 ENCOUNTER — Ambulatory Visit (INDEPENDENT_AMBULATORY_CARE_PROVIDER_SITE_OTHER): Payer: BC Managed Care – PPO | Admitting: Family Medicine

## 2022-05-01 ENCOUNTER — Encounter: Payer: Self-pay | Admitting: Family Medicine

## 2022-05-01 VITALS — BP 114/78 | HR 92 | Temp 99.9°F | Resp 16 | Ht 65.0 in | Wt 156.5 lb

## 2022-05-01 DIAGNOSIS — M25511 Pain in right shoulder: Secondary | ICD-10-CM | POA: Diagnosis not present

## 2022-05-01 DIAGNOSIS — Z0000 Encounter for general adult medical examination without abnormal findings: Secondary | ICD-10-CM

## 2022-05-01 DIAGNOSIS — E559 Vitamin D deficiency, unspecified: Secondary | ICD-10-CM

## 2022-05-01 DIAGNOSIS — E785 Hyperlipidemia, unspecified: Secondary | ICD-10-CM | POA: Diagnosis not present

## 2022-05-01 DIAGNOSIS — Z0001 Encounter for general adult medical examination with abnormal findings: Secondary | ICD-10-CM

## 2022-05-01 DIAGNOSIS — F339 Major depressive disorder, recurrent, unspecified: Secondary | ICD-10-CM

## 2022-05-01 LAB — BASIC METABOLIC PANEL
BUN: 12 mg/dL (ref 6–23)
CO2: 25 mEq/L (ref 19–32)
Calcium: 9.4 mg/dL (ref 8.4–10.5)
Chloride: 102 mEq/L (ref 96–112)
Creatinine, Ser: 0.71 mg/dL (ref 0.40–1.20)
GFR: 101.39 mL/min (ref >60.00)
Glucose, Bld: 94 mg/dL (ref 70–99)
Potassium: 4 mEq/L (ref 3.5–5.1)
Sodium: 136 mEq/L (ref 135–145)

## 2022-05-01 LAB — LIPID PANEL
Cholesterol: 194 mg/dL (ref 0–200)
HDL: 52 mg/dL (ref >39.00)
LDL Cholesterol: 111 mg/dL — ABNORMAL HIGH (ref 0–99)
NonHDL: 141.54
Total CHOL/HDL Ratio: 4
Triglycerides: 152 mg/dL — ABNORMAL HIGH (ref 0.0–149.0)
VLDL: 30.4 mg/dL (ref 0.0–40.0)

## 2022-05-01 LAB — HEPATIC FUNCTION PANEL
ALT: 27 U/L (ref 0–35)
AST: 20 U/L (ref 0–37)
Albumin: 4.6 g/dL (ref 3.5–5.2)
Alkaline Phosphatase: 69 U/L (ref 39–117)
Bilirubin, Direct: 0.1 mg/dL (ref 0.0–0.3)
Total Bilirubin: 0.4 mg/dL (ref 0.2–1.2)
Total Protein: 7.4 g/dL (ref 6.0–8.3)

## 2022-05-01 LAB — VITAMIN D 25 HYDROXY (VIT D DEFICIENCY, FRACTURES): VITD: 36.65 ng/mL (ref 30.00–100.00)

## 2022-05-01 LAB — CBC WITH DIFFERENTIAL/PLATELET
Basophils Absolute: 0.1 10*3/uL (ref 0.0–0.1)
Basophils Relative: 1.2 % (ref 0.0–3.0)
Eosinophils Absolute: 0.2 10*3/uL (ref 0.0–0.7)
Eosinophils Relative: 1.8 % (ref 0.0–5.0)
HCT: 39.6 % (ref 36.0–46.0)
Hemoglobin: 13.2 g/dL (ref 12.0–15.0)
Lymphocytes Relative: 32 % (ref 12.0–46.0)
Lymphs Abs: 2.8 10*3/uL (ref 0.7–4.0)
MCHC: 33.4 g/dL (ref 30.0–36.0)
MCV: 89.5 fl (ref 78.0–100.0)
Monocytes Absolute: 0.6 10*3/uL (ref 0.1–1.0)
Monocytes Relative: 6.6 % (ref 3.0–12.0)
Neutro Abs: 5.1 10*3/uL (ref 1.4–7.7)
Neutrophils Relative %: 58.4 % (ref 43.0–77.0)
Platelets: 318 10*3/uL (ref 150.0–400.0)
RBC: 4.43 Mil/uL (ref 3.87–5.11)
RDW: 13.9 % (ref 11.5–15.5)
WBC: 8.7 10*3/uL (ref 4.0–10.5)

## 2022-05-01 LAB — TSH: TSH: 1.68 u[IU]/mL (ref 0.35–5.50)

## 2022-05-01 MED ORDER — MELOXICAM 15 MG PO TABS: 15.0000 mg | ORAL_TABLET | Freq: Every day | ORAL | 0 refills | Status: DC

## 2022-05-01 NOTE — Progress Notes (Signed)
   Subjective:    Patient ID: Alexandra [name], female    DOB: 15-Mar-1976, 46 y.o.   MRN: [id number]  HPI CPE- UTD on mammo, tdap, colonoscopy, pap  Patient Care Team    Relationship Specialty Notifications Start End  Midge Minium, MD PCP - General   09/21/10   Azucena Fallen, MD Consulting Physician Obstetrics and Gynecology  04/19/20     Health Maintenance  Topic Date Due   Hepatitis C Screening  Never done   INFLUENZA VACCINE  05/09/2022   MAMMOGRAM  10/19/2022   TETANUS/TDAP  12/09/2023   COLONOSCOPY (Pts 45-26yr Insurance coverage will need to be confirmed)  01/07/2024   PAP SMEAR-Modifier  10/19/2024   COVID-19 Vaccine  Completed   HIV Screening  Completed   HPV VACCINES  Aged Out      Review of Systems Patient reports no vision/ hearing changes, adenopathy,fever, persistant/recurrent hoarseness , swallowing issues, chest pain, palpitations, edema, persistant/recurrent cough, hemoptysis, dyspnea (rest/exertional/paroxysmal nocturnal), gastrointestinal bleeding (melena, rectal bleeding), abdominal pain, significant heartburn, bowel changes, GU symptoms (dysuria, hematuria, incontinence), Gyn symptoms (abnormal  bleeding, pain),  syncope, focal weakness, memory loss, numbness & tingling, skin/hair/nail changes, abnormal bruising or bleeding, anxiety, or depression.   + 8 lb weight loss- cut wheat out of diet R anterior shoulder pain- sxs started ~1 month ago.  This is 2nd episode.  The only thing that resolves pain is lying flat.  Worse w/ standing.  Below collar bone.  'doesn't feel muscular'.    Objective:   Physical Exam General Appearance:    Alert, cooperative, no distress, appears stated age  Head:    Normocephalic, without obvious abnormality, atraumatic  Eyes:    PERRL, conjunctiva/corneas clear, EOM's intact both eyes  Ears:    Normal TM's and external ear canals, both ears  Nose:   Nares normal, septum midline, mucosa normal, no drainage    or sinus  tenderness  Throat:   Lips, mucosa, and tongue normal; teeth and gums normal  Neck:   Supple, symmetrical, trachea midline, no adenopathy;    Thyroid: no enlargement/tenderness/nodules  Back:     Symmetric, no curvature, ROM normal, no CVA tenderness  Lungs:     Clear to auscultation bilaterally, respirations unlabored  Chest Wall:    No tenderness or deformity   Heart:    Regular rate and rhythm, S1 and S2 normal, no murmur, rub   or gallop  Breast Exam:    Deferred to GYN  Abdomen:     Soft, non-tender, bowel sounds active all four quadrants,    no masses, no organomegaly  Genitalia:    Deferred to GYN  Rectal:    Extremities:   Extremities normal, atraumatic, no cyanosis or edema  Pulses:   2+ and symmetric all extremities  Skin:   Skin color, texture, turgor normal, no rashes or lesions  Lymph nodes:   Cervical, supraclavicular, and axillary nodes normal  Neurologic:   CNII-XII intact, normal strength, sensation and reflexes    throughout          Assessment & Plan:  R anterior shoulder pain- new.  Pain is actually below R collar bone and more chest wall related than true shoulder pain.  Pt w/ mild pectus excavatum which may be contributing.  Start daily Meloxicam.  Refer to Sports Med.

## 2022-05-01 NOTE — Patient Instructions (Signed)
Follow up in 1 year or as needed We'll notify you of your lab results and make any changes if needed Keep up the good work on healthy diet and regular exercise- you look great! START the Meloxicam once daily- take w/ food- for pain and inflammation We'll call you with your Sports Med appt Call with any questions or concerns Enjoy the rest of your summer!!!

## 2022-05-01 NOTE — Assessment & Plan Note (Signed)
Pt's PE WNL.  UTD on mammo, pap, colonoscopy, Tdap.  Check labs.  Anticipatory guidance provided.  

## 2022-05-01 NOTE — Assessment & Plan Note (Signed)
Mood is stable.  Currently in therapy.  No need for meds at this time

## 2022-05-01 NOTE — Assessment & Plan Note (Signed)
Chronic problem.  Currently on Red Yeast Rice.  Check labs and adjust plans prn.

## 2022-05-01 NOTE — Assessment & Plan Note (Signed)
Check labs and replete prn. 

## 2022-05-02 LAB — HEPATITIS C ANTIBODY: Hepatitis C Ab: NONREACTIVE

## 2022-05-02 NOTE — Progress Notes (Signed)
Pt seen results via my chart  

## 2022-05-10 NOTE — Progress Notes (Unsigned)
   I, Peterson Lombard, LAT, ATC acting as a scribe for Lynne Leader, MD.  Subjective:    CC: R shoulder pain  HPI: Pt is a 46 y/o female c/o R shoulder pain ongoing for about 1 month. Pt locates pain to   Neck pain: Radiates: UE numbness/tingling: UE weakness: Aggravates: Treatments tried:  Pertinent review of Systems: ***  Relevant historical information: ***   Objective:   There were no vitals filed for this visit. General: Well Developed, well nourished, and in no acute distress.   MSK: ***  Lab and Radiology Results No results found for this or any previous visit (from the past 72 hour(s)). No results found.    Impression and Recommendations:    Assessment and Plan: 46 y.o. female with ***.  PDMP not reviewed this encounter. No orders of the defined types were placed in this encounter.  No orders of the defined types were placed in this encounter.   Discussed warning signs or symptoms. Please see discharge instructions. Patient expresses understanding.   ***

## 2022-05-11 ENCOUNTER — Ambulatory Visit: Payer: Self-pay

## 2022-05-11 ENCOUNTER — Ambulatory Visit (INDEPENDENT_AMBULATORY_CARE_PROVIDER_SITE_OTHER): Payer: BC Managed Care – PPO | Admitting: Family Medicine

## 2022-05-11 ENCOUNTER — Ambulatory Visit (INDEPENDENT_AMBULATORY_CARE_PROVIDER_SITE_OTHER): Payer: BC Managed Care – PPO

## 2022-05-11 VITALS — BP 128/74 | HR 97 | Ht 65.0 in | Wt 157.0 lb

## 2022-05-11 DIAGNOSIS — M542 Cervicalgia: Secondary | ICD-10-CM

## 2022-05-11 DIAGNOSIS — M25511 Pain in right shoulder: Secondary | ICD-10-CM | POA: Diagnosis not present

## 2022-05-11 DIAGNOSIS — G8929 Other chronic pain: Secondary | ICD-10-CM | POA: Diagnosis not present

## 2022-05-11 MED ORDER — GABAPENTIN 300 MG PO CAPS: 300.0000 mg | ORAL_CAPSULE | Freq: Three times a day (TID) | ORAL | 2 refills | Status: DC

## 2022-05-11 MED ORDER — PREDNISONE 50 MG PO TABS: ORAL_TABLET | ORAL | 0 refills | Status: DC

## 2022-05-11 NOTE — Progress Notes (Signed)
Cervical spine x-ray shows some mild arthritis changes.

## 2022-05-11 NOTE — Progress Notes (Signed)
Right shoulder x-ray looks normal to radiology

## 2022-05-11 NOTE — Patient Instructions (Addendum)
Thank you for coming in today.   Please get an Xray today before you leave  I've sent a prescription for Prednisone and Gabapentin to your pharmacy.   I've referred you to Physical Therapy.  Let us know if you don't hear from them in one week.

## 2022-05-15 ENCOUNTER — Encounter: Payer: Self-pay | Admitting: Family Medicine

## 2022-05-22 DIAGNOSIS — M9903 Segmental and somatic dysfunction of lumbar region: Secondary | ICD-10-CM | POA: Diagnosis not present

## 2022-05-22 DIAGNOSIS — M9902 Segmental and somatic dysfunction of thoracic region: Secondary | ICD-10-CM | POA: Diagnosis not present

## 2022-05-22 DIAGNOSIS — M9908 Segmental and somatic dysfunction of rib cage: Secondary | ICD-10-CM | POA: Diagnosis not present

## 2022-05-22 DIAGNOSIS — M5386 Other specified dorsopathies, lumbar region: Secondary | ICD-10-CM | POA: Diagnosis not present

## 2022-05-23 ENCOUNTER — Encounter: Payer: Self-pay | Admitting: Physical Therapy

## 2022-05-23 ENCOUNTER — Ambulatory Visit (INDEPENDENT_AMBULATORY_CARE_PROVIDER_SITE_OTHER): Payer: BC Managed Care – PPO | Admitting: Physical Therapy

## 2022-05-23 DIAGNOSIS — M25511 Pain in right shoulder: Secondary | ICD-10-CM

## 2022-05-23 DIAGNOSIS — M542 Cervicalgia: Secondary | ICD-10-CM

## 2022-05-23 NOTE — Therapy (Signed)
OUTPATIENT PHYSICAL THERAPY SHOULDER EVALUATION  Patient Name: Alexandra [name] MRN: [id number] DOB:12-Aug-1976, 46 y.o., female Today's Date: 05/23/2022   PT End of Session - 05/23/22 1618     Visit Number 1    Number of Visits 12    Date for PT Re-Evaluation 07/04/22    Authorization Type BCBS    PT Start Time 1220    PT Stop Time 1300    PT Time Calculation (min) 40 min    Activity Tolerance Patient tolerated treatment well    Behavior During Therapy WFL for tasks assessed/performed             Past Medical History:  Diagnosis Date   Abnormal Pap smear    cryo 1999   Allergy    Anemia    in college   Anxiety    Asthma    also has current cough,last attack <yr   Basal cell carcinoma of nose    Family history of malignant neoplasm of gastrointestinal tract    Gallbladder attack    Gallstone    GERD (gastroesophageal reflux disease)    Hemorrhoids    Hx: UTI (urinary tract infection)    Hyperlipidemia    borderline high   Melanoma of lower limb ([place])    Sleep apnea    uses cpap   Past Surgical History:  Procedure Laterality Date   BASAL CELL CARCINOMA EXCISION  2021   on nose   CHOLECYSTECTOMY     CRYOTHERAPY     cervical   MELANOMA EXCISION  09/17/2012   Procedure: MELANOMA EXCISION;  Surgeon: Imogene Burn. Georgette Dover, MD;  Location: [facility];  Service: General;  Laterality: Left;  wide excision of meanoma-posterior left leg   Patient Active Problem List   Diagnosis Date Noted   OSA (obstructive sleep apnea) 08/13/2020   Hyperlipidemia 04/16/2019   Vitamin D deficiency 04/16/2019   Depression, recurrent ([place]) 04/16/2019   GERD (gastroesophageal reflux disease) 05/14/2014   History of melanoma 08/29/2012   Physical exam 05/27/2012   Family history of malignant neoplasm of gastrointestinal tract 10/24/2011   ASTHMA 12/18/2008    PCP: Annye Asa  REFERRING PROVIDER: Lynne Leader  REFERRING DIAG: R shoulder pain  THERAPY DIAG:  Acute pain of right  shoulder  Cervicalgia  Rationale for Evaluation and Treatment Rehabilitation  ONSET DATE:   SUBJECTIVE:                                                                                                                                                                                      SUBJECTIVE STATEMENT: Pt states onset of pain in R anterior shoulder/pec for over 1 month.  No incident to report onset of pain. She has seen chiropractor (today) and in the past for neck, thoracic, and lumbar adjustments.  Recently finished a round of prednisone, feels that it helped.   Pt works on Teaching laboratory technician, Health and safety inspector, has standing desk.    PERTINENT HISTORY:   PAIN:  Are you having pain? Yes: NPRS scale: 4-5 /10 Pain location: R anterior shoulder  Pain description: tender, sore  Aggravating factors: sitting or standing  Relieving factors: laying flat  PRECAUTIONS: None  WEIGHT BEARING RESTRICTIONS No  FALLS:  Has patient fallen in last 6 months? No  PLOF: Independent  PATIENT GOALS Decreased pain in shoulder  OBJECTIVE:   DIAGNOSTIC FINDINGS:  Neg [place] for shoulder   COGNITION:  Overall cognitive status: Within functional limits for tasks assessed  POSTURE: Fwd rounded shoulders bilaterally   UPPER EXTREMITY ROM:  Shoulder ROM: WNL,  Cervical ROM: WNL   UPPER EXTREMITY MMT:  Shoulder 4+/5 bil;   PALPATION:  Pain and tenderness in R pec, no pain at sternum, clavicle, or GHJ. No pain with cervical testing. Does have tightness in bil sub occipitals, with headaches.    TODAY'S TREATMENT:  Ther ex: see below for HEP Manual: STM to R pec   PATIENT EDUCATION: Education details: PT POC, Exam findings, HEP Person educated: Patient Education method: Explanation, Demonstration, Tactile cues, Verbal cues, and Handouts Education comprehension: verbalized understanding, returned demonstration, verbal cues required, tactile cues required, and needs further education   HOME EXERCISE  PROGRAM: Access Code: [email or web address] URL: [email or web address] Date: 05/23/2022 Prepared by: Lyndee Hensen  Exercises - Standing Scapular Retraction  - 3 x daily - 1 sets - 10 reps - Doorway Pec Stretch at 90 Degrees Abduction  - 2 x daily - 3 reps - 30 hold - Supine Single Arm Pectoralis Stretch  - 2 x daily - 3 reps - 30 hold  ASSESSMENT:  CLINICAL IMPRESSION: Patient presents with primary complaint of ongoing pain in R pec region. She has muscle tension and soreness with palpation today. She has minimal pain in shoulder or neck with testing today, but does have muscle tension in sub occipital region. Will benefit from treatment of neck as well to rule out as pain source. She will benefit from posture education and strengthening, as well as strengthening for shoulder complex. Pt with decreased ability for full functional activities due to pain and deficit at this time. Pt to benefit from skilled PT to improve.    OBJECTIVE IMPAIRMENTS decreased activity tolerance, decreased mobility, decreased strength, increased fascial restrictions, increased muscle spasms, impaired UE functional use, improper body mechanics, and pain.   ACTIVITY LIMITATIONS carrying, bending, sleeping, reach over head, and locomotion level  PARTICIPATION LIMITATIONS: meal prep, cleaning, laundry, driving, shopping, and community activity  PERSONAL FACTORS  none  are also affecting patient's functional outcome.   REHAB POTENTIAL: Good  CLINICAL DECISION MAKING: Stable/uncomplicated  EVALUATION COMPLEXITY: Low   GOALS: Goals reviewed with patient? Yes  SHORT TERM GOALS: Target date: 06/06/2022    Pt to be independent with initial HEP  Goal status: INITIAL  2.  Pt to report decreased pain in R chest to 0-3/10   Goal status: INITIAL    LONG TERM GOALS: Target date: 07/04/2022    Pt to be independent with final HEP  Goal status: INITIAL  2.  Pt to report decreased pain in R side of chest  to 0-2/10 with activity   Goal status: INITIAL  3. Pt to demo ability for reach, lift,  carry, without pain, to improve ability for IADLS and work duties.    Goal status: INITIAL  4.  Pt to report taking postural breaks from work , for chronic neck pain.   Goal status: INITIAL    PLAN: PT FREQUENCY: 1-2x/week  PT DURATION: 6 weeks  PLANNED INTERVENTIONS: Therapeutic exercises, Therapeutic activity, Neuromuscular re-education, Patient/Family education, Self Care, Joint mobilization, Joint manipulation, DME instructions, Dry Needling, Electrical stimulation, Spinal manipulation, Spinal mobilization, Cryotherapy, Moist heat, Taping, Vasopneumatic device, Traction, Ultrasound, Ionotophoresis '4mg'$ /ml Dexamethasone, and Manual therapy  PLAN FOR NEXT SESSION:   Lyndee Hensen, PT, DPT 7:18 PM  05/23/22

## 2022-05-24 DIAGNOSIS — F419 Anxiety disorder, unspecified: Secondary | ICD-10-CM | POA: Diagnosis not present

## 2022-05-25 ENCOUNTER — Ambulatory Visit (INDEPENDENT_AMBULATORY_CARE_PROVIDER_SITE_OTHER): Payer: BC Managed Care – PPO | Admitting: Physical Therapy

## 2022-05-25 DIAGNOSIS — M542 Cervicalgia: Secondary | ICD-10-CM | POA: Diagnosis not present

## 2022-05-25 DIAGNOSIS — M25511 Pain in right shoulder: Secondary | ICD-10-CM | POA: Diagnosis not present

## 2022-05-25 NOTE — Therapy (Signed)
OUTPATIENT PHYSICAL THERAPY SHOULDER TREATMENT  Patient Name: Alexandra [name] MRN: [id number] DOB:1976/06/27, 46 y.o., female Today's Date: 05/25/2022   PT End of Session - 05/28/22 1036     Visit Number 2    Number of Visits 12    Date for PT Re-Evaluation 07/04/22    Authorization Type BCBS    PT Start Time 1432    PT Stop Time 1513    PT Time Calculation (min) 41 min    Activity Tolerance Patient tolerated treatment well    Behavior During Therapy WFL for tasks assessed/performed              Past Medical History:  Diagnosis Date   Abnormal Pap smear    cryo 1999   Allergy    Anemia    in college   Anxiety    Asthma    also has current cough,last attack <yr   Basal cell carcinoma of nose    Family history of malignant neoplasm of gastrointestinal tract    Gallbladder attack    Gallstone    GERD (gastroesophageal reflux disease)    Hemorrhoids    Hx: UTI (urinary tract infection)    Hyperlipidemia    borderline high   Melanoma of lower limb ([place])    Sleep apnea    uses cpap   Past Surgical History:  Procedure Laterality Date   BASAL CELL CARCINOMA EXCISION  2021   on nose   CHOLECYSTECTOMY     CRYOTHERAPY     cervical   MELANOMA EXCISION  09/17/2012   Procedure: MELANOMA EXCISION;  Surgeon: Imogene Burn. Georgette Dover, MD;  Location: [facility];  Service: General;  Laterality: Left;  wide excision of meanoma-posterior left leg   Patient Active Problem List   Diagnosis Date Noted   OSA (obstructive sleep apnea) 08/13/2020   Hyperlipidemia 04/16/2019   Vitamin D deficiency 04/16/2019   Depression, recurrent ([place]) 04/16/2019   GERD (gastroesophageal reflux disease) 05/14/2014   History of melanoma 08/29/2012   Physical exam 05/27/2012   Family history of malignant neoplasm of gastrointestinal tract 10/24/2011   ASTHMA 12/18/2008    PCP: Annye Asa  REFERRING PROVIDER: Lynne Leader  REFERRING DIAG: R shoulder pain  THERAPY DIAG:  Acute pain of right  shoulder  Cervicalgia  Rationale for Evaluation and Treatment Rehabilitation  ONSET DATE:   SUBJECTIVE:                                                                                                                                                                                      SUBJECTIVE STATEMENT: Pt states mild relief after Eval with STM. Has been doing HEP.  Eval:Pt states onset of pain in R anterior shoulder/pec for over 1 month. No incident to report onset of pain. She has seen chiropractor (today) and in the past for neck, thoracic, and lumbar adjustments.  Recently finished a round of prednisone, feels that it helped.   Pt works on Teaching laboratory technician, Health and safety inspector, has standing desk.    PERTINENT HISTORY:   PAIN:  Are you having pain? Yes: NPRS scale: 4-5 /10 Pain location: R anterior shoulder  Pain description: tender, sore  Aggravating factors: sitting or standing  Relieving factors: laying flat  PRECAUTIONS: None  WEIGHT BEARING RESTRICTIONS No  FALLS:  Has patient fallen in last 6 months? No  PLOF: Independent  PATIENT GOALS Decreased pain in shoulder  OBJECTIVE:   DIAGNOSTIC FINDINGS:  Neg [place] for shoulder   COGNITION:  Overall cognitive status: Within functional limits for tasks assessed  POSTURE: Fwd rounded shoulders bilaterally   UPPER EXTREMITY ROM:  Shoulder ROM: WNL,  Cervical ROM: WNL   UPPER EXTREMITY MMT:  Shoulder 4+/5 bil;   PALPATION:  Pain and tenderness in R pec, no pain at sternum, clavicle, or GHJ. No pain with cervical testing. Does have tightness in bil sub occipitals, with headaches.    TODAY'S TREATMENT:  05/25/22: Therapeutic Exercise: Aerobic: Supine: Chin tucks x 15;  Seated: Standing: Shoulder rolls x 20; Scap squeeze x 20; ER ROM at wall x 20; ER bil YTB x 20;  Stretches: Supine pec stretch x 2 min;  Supine ER at 90 deg x 15;  Neuromuscular Re-education: Manual Therapy: Manual cervical traction 10 sec x 10; SOR;   STM/TPR R pec . Self Care:    PATIENT EDUCATION: Education details: PT POC, Exam findings, HEP Person educated: Patient Education method: Explanation, Demonstration, Tactile cues, Verbal cues, and Handouts Education comprehension: verbalized understanding, returned demonstration, verbal cues required, tactile cues required, and needs further education   HOME EXERCISE PROGRAM: Access Code: [email or web address] URL: [email or web address] Date: 05/23/2022 Prepared by: Lyndee Hensen  Exercises - Standing Scapular Retraction  - 3 x daily - 1 sets - 10 reps - Doorway Pec Stretch at 90 Degrees Abduction  - 2 x daily - 3 reps - 30 hold - Supine Single Arm Pectoralis Stretch  - 2 x daily - 3 reps - 30 hold  ASSESSMENT:  CLINICAL IMPRESSION: 05/25/22: Pt with muscle tension in sub occipital region, addressed withmanual today, will benefit from continued work on posture and head/shoulder position. Continued muscle tension in R pec with palpation and manual. Plan to continue manual and strengthening as tolerated.   Eval:Patient presents with primary complaint of ongoing pain in R pec region. She has muscle tension and soreness with palpation today. She has minimal pain in shoulder or neck with testing today, but does have muscle tension in sub occipital region. Will benefit from treatment of neck as well to rule out as pain source. She will benefit from posture education and strengthening, as well as strengthening for shoulder complex. Pt with decreased ability for full functional activities due to pain and deficit at this time. Pt to benefit from skilled PT to improve.    OBJECTIVE IMPAIRMENTS decreased activity tolerance, decreased mobility, decreased strength, increased fascial restrictions, increased muscle spasms, impaired UE functional use, improper body mechanics, and pain.   ACTIVITY LIMITATIONS carrying, bending, sleeping, reach over head, and locomotion level  PARTICIPATION  LIMITATIONS: meal prep, cleaning, laundry, driving, shopping, and community activity  PERSONAL FACTORS  none  are also affecting patient's functional outcome.   REHAB  POTENTIAL: Good  CLINICAL DECISION MAKING: Stable/uncomplicated  EVALUATION COMPLEXITY: Low   GOALS: Goals reviewed with patient? Yes  SHORT TERM GOALS: Target date: 06/06/2022    Pt to be independent with initial HEP  Goal status: INITIAL  2.  Pt to report decreased pain in R chest to 0-3/10   Goal status: INITIAL    LONG TERM GOALS: Target date: 07/04/2022    Pt to be independent with final HEP  Goal status: INITIAL  2.  Pt to report decreased pain in R side of chest to 0-2/10 with activity   Goal status: INITIAL  3. Pt to demo ability for reach, lift, carry, without pain, to improve ability for IADLS and work duties.    Goal status: INITIAL  4.  Pt to report taking postural breaks from work , for chronic neck pain.   Goal status: INITIAL    PLAN: PT FREQUENCY: 1-2x/week  PT DURATION: 6 weeks  PLANNED INTERVENTIONS: Therapeutic exercises, Therapeutic activity, Neuromuscular re-education, Patient/Family education, Self Care, Joint mobilization, Joint manipulation, DME instructions, Dry Needling, Electrical stimulation, Spinal manipulation, Spinal mobilization, Cryotherapy, Moist heat, Taping, Vasopneumatic device, Traction, Ultrasound, Ionotophoresis '4mg'$ /ml Dexamethasone, and Manual therapy  PLAN FOR NEXT SESSION:   Lyndee Hensen, PT, DPT 10:38 AM  05/28/22

## 2022-05-28 ENCOUNTER — Encounter: Payer: Self-pay | Admitting: Physical Therapy

## 2022-05-29 ENCOUNTER — Ambulatory Visit (INDEPENDENT_AMBULATORY_CARE_PROVIDER_SITE_OTHER): Payer: BC Managed Care – PPO | Admitting: Physical Therapy

## 2022-05-29 ENCOUNTER — Encounter: Payer: Self-pay | Admitting: Physical Therapy

## 2022-05-29 DIAGNOSIS — M25511 Pain in right shoulder: Secondary | ICD-10-CM | POA: Diagnosis not present

## 2022-05-29 DIAGNOSIS — M542 Cervicalgia: Secondary | ICD-10-CM

## 2022-05-29 NOTE — Therapy (Signed)
OUTPATIENT PHYSICAL THERAPY SHOULDER TREATMENT  Patient Name: Alexandra [name] MRN: [id number] DOB:07-23-1976, 46 y.o., female Today's Date: 05/29/2022   PT End of Session - 05/29/22 1346     Visit Number 3    Number of Visits 12    Date for PT Re-Evaluation 07/04/22    Authorization Type BCBS    PT Start Time 1308    PT Stop Time 1346    PT Time Calculation (min) 38 min    Activity Tolerance Patient tolerated treatment well    Behavior During Therapy WFL for tasks assessed/performed              Past Medical History:  Diagnosis Date   Abnormal Pap smear    cryo 1999   Allergy    Anemia    in college   Anxiety    Asthma    also has current cough,last attack <yr   Basal cell carcinoma of nose    Family history of malignant neoplasm of gastrointestinal tract    Gallbladder attack    Gallstone    GERD (gastroesophageal reflux disease)    Hemorrhoids    Hx: UTI (urinary tract infection)    Hyperlipidemia    borderline high   Melanoma of lower limb ([place])    Sleep apnea    uses cpap   Past Surgical History:  Procedure Laterality Date   BASAL CELL CARCINOMA EXCISION  2021   on nose   CHOLECYSTECTOMY     CRYOTHERAPY     cervical   MELANOMA EXCISION  09/17/2012   Procedure: MELANOMA EXCISION;  Surgeon: Imogene Burn. Georgette Dover, MD;  Location: [facility];  Service: General;  Laterality: Left;  wide excision of meanoma-posterior left leg   Patient Active Problem List   Diagnosis Date Noted   OSA (obstructive sleep apnea) 08/13/2020   Hyperlipidemia 04/16/2019   Vitamin D deficiency 04/16/2019   Depression, recurrent ([place]) 04/16/2019   GERD (gastroesophageal reflux disease) 05/14/2014   History of melanoma 08/29/2012   Physical exam 05/27/2012   Family history of malignant neoplasm of gastrointestinal tract 10/24/2011   ASTHMA 12/18/2008    PCP: Annye Asa  REFERRING PROVIDER: Lynne Leader  REFERRING DIAG: R shoulder pain  THERAPY DIAG:  Acute pain of right  shoulder  Cervicalgia  Rationale for Evaluation and Treatment Rehabilitation  ONSET DATE:   SUBJECTIVE:                                                                                                                                                                                      SUBJECTIVE STATEMENT: Pt states decreased pain in last couple days.    Eval:Pt  states onset of pain in R anterior shoulder/pec for over 1 month. No incident to report onset of pain. She has seen chiropractor (today) and in the past for neck, thoracic, and lumbar adjustments.  Recently finished a round of prednisone, feels that it helped.   Pt works on Teaching laboratory technician, Health and safety inspector, has standing desk.    PERTINENT HISTORY:   PAIN:  Are you having pain? Yes: NPRS scale: 4-5 /10 Pain location: R anterior shoulder  Pain description: tender, sore  Aggravating factors: sitting or standing  Relieving factors: laying flat  PRECAUTIONS: None  WEIGHT BEARING RESTRICTIONS No  FALLS:  Has patient fallen in last 6 months? No  PLOF: Independent  PATIENT GOALS Decreased pain in shoulder  OBJECTIVE:   DIAGNOSTIC FINDINGS:  Neg [place] for shoulder   COGNITION:  Overall cognitive status: Within functional limits for tasks assessed  POSTURE: Fwd rounded shoulders bilaterally   UPPER EXTREMITY ROM:  Shoulder ROM: WNL,  Cervical ROM: WNL   UPPER EXTREMITY MMT:  Shoulder 4+/5 bil;   PALPATION:  Pain and tenderness in R pec, no pain at sternum, clavicle, or GHJ. No pain with cervical testing. Does have tightness in bil sub occipitals, with headaches.    TODAY'S TREATMENT:   05/29/22: Therapeutic Exercise: Aerobic: Supine:   Horizontal abd RTB x 15;  Seated: Chin tucks x 15;  Standing: Shoulder rolls x 10; Scap squeeze x 20 at wall ;  ER ROM at wall x 20;  ER bil RTB x 20;  Stretches: Supine pec stretch x 2 min;  Supine ER at 90 deg x 15; Doorway 30 sec x 3 bil;  Neuromuscular Re-education: Manual Therapy:  Manual cervical traction;  SOR;  STM/TPR R pec . Self Care:    05/25/22: Therapeutic Exercise: Aerobic: Supine: Chin tucks x 15;  Seated: Standing: Shoulder rolls x 20; Scap squeeze x 20; ER ROM at wall x 20; ER bil YTB x 20;  Stretches: Supine pec stretch x 2 min;  Supine ER at 90 deg x 15;  Neuromuscular Re-education: Manual Therapy: Manual cervical traction 10 sec x 10; SOR;  STM/TPR R pec . Self Care:    PATIENT EDUCATION: Education details: Reviewed HEP Person educated: Patient Education method: Explanation, Demonstration, Tactile cues, Verbal cues, and Handouts Education comprehension: verbalized understanding, returned demonstration, verbal cues required, tactile cues required, and needs further education   HOME EXERCISE PROGRAM:  Access Code: [email or web address] URL: [email or web address] Date: 05/23/2022 Prepared by: Lyndee Hensen Exercises - Standing Scapular Retraction  - 3 x daily - 1 sets - 10 reps - Doorway Pec Stretch at 90 Degrees Abduction  - 2 x daily - 3 reps - 30 hold - Supine Single Arm Pectoralis Stretch  - 2 x daily - 3 reps - 30 hold  ASSESSMENT:  CLINICAL IMPRESSION: 05/25/22: Pt with improving pain levels. Continues to have tenderness in pec with palpation and manual, but decreased from previous weeks. Doing well with postural alignment and strengthening. Plan to progress as able.   Eval:Patient presents with primary complaint of ongoing pain in R pec region. She has muscle tension and soreness with palpation today. She has minimal pain in shoulder or neck with testing today, but does have muscle tension in sub occipital region. Will benefit from treatment of neck as well to rule out as pain source. She will benefit from posture education and strengthening, as well as strengthening for shoulder complex. Pt with decreased ability for full functional activities due to pain and deficit at this time.  Pt to benefit from skilled PT to improve.     OBJECTIVE IMPAIRMENTS decreased activity tolerance, decreased mobility, decreased strength, increased fascial restrictions, increased muscle spasms, impaired UE functional use, improper body mechanics, and pain.   ACTIVITY LIMITATIONS carrying, bending, sleeping, reach over head, and locomotion level  PARTICIPATION LIMITATIONS: meal prep, cleaning, laundry, driving, shopping, and community activity  PERSONAL FACTORS  none  are also affecting patient's functional outcome.   REHAB POTENTIAL: Good  CLINICAL DECISION MAKING: Stable/uncomplicated  EVALUATION COMPLEXITY: Low   GOALS: Goals reviewed with patient? Yes  SHORT TERM GOALS: Target date: 06/06/2022    Pt to be independent with initial HEP  Goal status: INITIAL  2.  Pt to report decreased pain in R chest to 0-3/10   Goal status: INITIAL    LONG TERM GOALS: Target date: 07/04/2022    Pt to be independent with final HEP  Goal status: INITIAL  2.  Pt to report decreased pain in R side of chest to 0-2/10 with activity   Goal status: INITIAL  3. Pt to demo ability for reach, lift, carry, without pain, to improve ability for IADLS and work duties.    Goal status: INITIAL  4.  Pt to report taking postural breaks from work , for chronic neck pain.   Goal status: INITIAL    PLAN: PT FREQUENCY: 1-2x/week  PT DURATION: 6 weeks  PLANNED INTERVENTIONS: Therapeutic exercises, Therapeutic activity, Neuromuscular re-education, Patient/Family education, Self Care, Joint mobilization, Joint manipulation, DME instructions, Dry Needling, Electrical stimulation, Spinal manipulation, Spinal mobilization, Cryotherapy, Moist heat, Taping, Vasopneumatic device, Traction, Ultrasound, Ionotophoresis '4mg'$ /ml Dexamethasone, and Manual therapy  PLAN FOR NEXT SESSION:   Lyndee Hensen, PT, DPT 2:38 PM  05/29/22

## 2022-06-05 ENCOUNTER — Encounter: Payer: Self-pay | Admitting: Physical Therapy

## 2022-06-05 ENCOUNTER — Ambulatory Visit (INDEPENDENT_AMBULATORY_CARE_PROVIDER_SITE_OTHER): Payer: BC Managed Care – PPO | Admitting: Physical Therapy

## 2022-06-05 DIAGNOSIS — M25511 Pain in right shoulder: Secondary | ICD-10-CM | POA: Diagnosis not present

## 2022-06-05 DIAGNOSIS — M542 Cervicalgia: Secondary | ICD-10-CM | POA: Diagnosis not present

## 2022-06-05 NOTE — Therapy (Signed)
OUTPATIENT PHYSICAL THERAPY SHOULDER TREATMENT  Patient Name: Alexandra [name] MRN: [id number] DOB:07/15/1976, 46 y.o., female Today's Date: 06/05/2022   PT End of Session - 06/05/22 1311     Visit Number 4    Number of Visits 12    Date for PT Re-Evaluation 07/04/22    Authorization Type BCBS    PT Start Time 1217    PT Stop Time 1300    PT Time Calculation (min) 43 min    Activity Tolerance Patient tolerated treatment well    Behavior During Therapy WFL for tasks assessed/performed               Past Medical History:  Diagnosis Date   Abnormal Pap smear    cryo 1999   Allergy    Anemia    in college   Anxiety    Asthma    also has current cough,last attack <yr   Basal cell carcinoma of nose    Family history of malignant neoplasm of gastrointestinal tract    Gallbladder attack    Gallstone    GERD (gastroesophageal reflux disease)    Hemorrhoids    Hx: UTI (urinary tract infection)    Hyperlipidemia    borderline high   Melanoma of lower limb ([place])    Sleep apnea    uses cpap   Past Surgical History:  Procedure Laterality Date   BASAL CELL CARCINOMA EXCISION  2021   on nose   CHOLECYSTECTOMY     CRYOTHERAPY     cervical   MELANOMA EXCISION  09/17/2012   Procedure: MELANOMA EXCISION;  Surgeon: Imogene Burn. Georgette Dover, MD;  Location: [facility];  Service: General;  Laterality: Left;  wide excision of meanoma-posterior left leg   Patient Active Problem List   Diagnosis Date Noted   OSA (obstructive sleep apnea) 08/13/2020   Hyperlipidemia 04/16/2019   Vitamin D deficiency 04/16/2019   Depression, recurrent ([place]) 04/16/2019   GERD (gastroesophageal reflux disease) 05/14/2014   History of melanoma 08/29/2012   Physical exam 05/27/2012   Family history of malignant neoplasm of gastrointestinal tract 10/24/2011   ASTHMA 12/18/2008    PCP: Annye Asa  REFERRING PROVIDER: Lynne Leader  REFERRING DIAG: R shoulder pain  THERAPY DIAG:  Acute pain of  right shoulder  Cervicalgia  Rationale for Evaluation and Treatment Rehabilitation  ONSET DATE:   SUBJECTIVE:                                                                                                                                                                                      SUBJECTIVE STATEMENT: Pt states decreased pain in last couple days.  Eval:Pt states onset of pain in R anterior shoulder/pec for over 1 month. No incident to report onset of pain. She has seen chiropractor (today) and in the past for neck, thoracic, and lumbar adjustments.  Recently finished a round of prednisone, feels that it helped.   Pt works on Teaching laboratory technician, Health and safety inspector, has standing desk.    PERTINENT HISTORY:   PAIN:  Are you having pain? Yes: NPRS scale: 4-5 /10 Pain location: R anterior shoulder  Pain description: tender, sore  Aggravating factors: sitting or standing  Relieving factors: laying flat  PRECAUTIONS: None  WEIGHT BEARING RESTRICTIONS No  FALLS:  Has patient fallen in last 6 months? No  PLOF: Independent  PATIENT GOALS Decreased pain in shoulder  OBJECTIVE:   DIAGNOSTIC FINDINGS:  Neg [place] for shoulder   COGNITION:  Overall cognitive status: Within functional limits for tasks assessed  POSTURE: Fwd rounded shoulders bilaterally   UPPER EXTREMITY ROM:  Shoulder ROM: WNL,  Cervical ROM: WNL   UPPER EXTREMITY MMT:  Shoulder 4+/5 bil;   PALPATION:  Pain and tenderness in R pec, no pain at sternum, clavicle, or GHJ. No pain with cervical testing. Does have tightness in bil sub occipitals, with headaches.    TODAY'S TREATMENT:   06/05/22: Therapeutic Exercise: Aerobic: Supine:  shoulder ER 2 lb  2x10 bil;  Chest press 3 lb 2x10 bil;  Flys 2lb 2 x 10 bil;  Seated:  Standing:  ER ROM at wall x 20;  ER bil RTB x 20;  Rows Blue TB x 20;  Low rows GTB x 20 ;  Horizontal abd RTB x 15; Wall push ups 2 x 10;   Stretches: Supine pec stretch x 2 min;  Supine ER at 90  deg x 15; Doorway 30 sec x 3 bil;  Neuromuscular Re-education: Manual Therapy: Manual cervical traction;  SOR;  STM/TPR R pec . Self Care:   05/29/22: Therapeutic Exercise: Aerobic: Supine:   Horizontal abd RTB x 15;  Seated: Chin tucks x 15;  Standing: Shoulder rolls x 10; Scap squeeze x 20 at wall ;  ER ROM at wall x 20;  ER bil RTB x 20;  Stretches: Supine pec stretch x 2 min;  Supine ER at 90 deg x 15; Doorway 30 sec x 3 bil;  Neuromuscular Re-education: Manual Therapy: Manual cervical traction;  SOR;  STM/TPR R pec . Self Care:    05/25/22: Therapeutic Exercise: Aerobic: Supine: Chin tucks x 15;  Seated: Standing: Shoulder rolls x 20; Scap squeeze x 20; ER ROM at wall x 20; ER bil YTB x 20;  Stretches: Supine pec stretch x 2 min;  Supine ER at 90 deg x 15;  Neuromuscular Re-education: Manual Therapy: Manual cervical traction 10 sec x 10; SOR;  STM/TPR R pec . Self Care:    PATIENT EDUCATION: Education details: Reviewed HEP Person educated: Patient Education method: Explanation, Demonstration, Tactile cues, Verbal cues, and Handouts Education comprehension: verbalized understanding, returned demonstration, verbal cues required, tactile cues required, and needs further education   HOME EXERCISE PROGRAM:  Access Code: [email or web address] URL: [email or web address] Date: 05/23/2022 Prepared by: Lyndee Hensen Exercises - Standing Scapular Retraction  - 3 x daily - 1 sets - 10 reps - Doorway Pec Stretch at 90 Degrees Abduction  - 2 x daily - 3 reps - 30 hold - Supine Single Arm Pectoralis Stretch  - 2 x daily - 3 reps - 30 hold  ASSESSMENT:  CLINICAL IMPRESSION: 06/05/22: Pt with improving pain levels. Less pain with  palpation and manual today. Good ability for chest activation with ther ex today without pain, will assess next visit. Plan to progress strength as able, pt progressing well.    Eval:Patient presents with primary complaint of ongoing pain in R pec  region. She has muscle tension and soreness with palpation today. She has minimal pain in shoulder or neck with testing today, but does have muscle tension in sub occipital region. Will benefit from treatment of neck as well to rule out as pain source. She will benefit from posture education and strengthening, as well as strengthening for shoulder complex. Pt with decreased ability for full functional activities due to pain and deficit at this time. Pt to benefit from skilled PT to improve.    OBJECTIVE IMPAIRMENTS decreased activity tolerance, decreased mobility, decreased strength, increased fascial restrictions, increased muscle spasms, impaired UE functional use, improper body mechanics, and pain.   ACTIVITY LIMITATIONS carrying, bending, sleeping, reach over head, and locomotion level  PARTICIPATION LIMITATIONS: meal prep, cleaning, laundry, driving, shopping, and community activity  PERSONAL FACTORS  none  are also affecting patient's functional outcome.   REHAB POTENTIAL: Good  CLINICAL DECISION MAKING: Stable/uncomplicated  EVALUATION COMPLEXITY: Low   GOALS: Goals reviewed with patient? Yes  SHORT TERM GOALS: Target date: 06/06/2022    Pt to be independent with initial HEP  Goal status: INITIAL  2.  Pt to report decreased pain in R chest to 0-3/10   Goal status: INITIAL    LONG TERM GOALS: Target date: 07/04/2022    Pt to be independent with final HEP  Goal status: INITIAL  2.  Pt to report decreased pain in R side of chest to 0-2/10 with activity   Goal status: INITIAL  3. Pt to demo ability for reach, lift, carry, without pain, to improve ability for IADLS and work duties.    Goal status: INITIAL  4.  Pt to report taking postural breaks from work , for chronic neck pain.   Goal status: INITIAL    PLAN: PT FREQUENCY: 1-2x/week  PT DURATION: 6 weeks  PLANNED INTERVENTIONS: Therapeutic exercises, Therapeutic activity, Neuromuscular re-education,  Patient/Family education, Self Care, Joint mobilization, Joint manipulation, DME instructions, Dry Needling, Electrical stimulation, Spinal manipulation, Spinal mobilization, Cryotherapy, Moist heat, Taping, Vasopneumatic device, Traction, Ultrasound, Ionotophoresis '4mg'$ /ml Dexamethasone, and Manual therapy  PLAN FOR NEXT SESSION:   Lyndee Hensen, PT, DPT 1:12 PM  06/05/22

## 2022-06-07 ENCOUNTER — Encounter: Payer: Self-pay | Admitting: Physical Therapy

## 2022-06-07 ENCOUNTER — Ambulatory Visit (INDEPENDENT_AMBULATORY_CARE_PROVIDER_SITE_OTHER): Payer: BC Managed Care – PPO | Admitting: Physical Therapy

## 2022-06-07 DIAGNOSIS — M542 Cervicalgia: Secondary | ICD-10-CM | POA: Diagnosis not present

## 2022-06-07 DIAGNOSIS — M25511 Pain in right shoulder: Secondary | ICD-10-CM | POA: Diagnosis not present

## 2022-06-07 NOTE — Therapy (Signed)
OUTPATIENT PHYSICAL THERAPY SHOULDER TREATMENT  Patient Name: Alexandra [name] MRN: [id number] DOB:[birth year], 46 y.o., female Today's Date: 06/07/2022   PT End of Session - 06/07/22 1259     Visit Number 5    Number of Visits 12    Date for PT Re-Evaluation 07/04/22    Authorization Type BCBS    PT Start Time 1215    PT Stop Time 1255    PT Time Calculation (min) 40 min    Activity Tolerance Patient tolerated treatment well    Behavior During Therapy WFL for tasks assessed/performed                Past Medical History:  Diagnosis Date   Abnormal Pap smear    cryo 1999   Allergy    Anemia    in college   Anxiety    Asthma    also has current cough,last attack <yr   Basal cell carcinoma of nose    Family history of malignant neoplasm of gastrointestinal tract    Gallbladder attack    Gallstone    GERD (gastroesophageal reflux disease)    Hemorrhoids    Hx: UTI (urinary tract infection)    Hyperlipidemia    borderline high   Melanoma of lower limb ([place])    Sleep apnea    uses cpap   Past Surgical History:  Procedure Laterality Date   BASAL CELL CARCINOMA EXCISION  2021   on nose   CHOLECYSTECTOMY     CRYOTHERAPY     cervical   MELANOMA EXCISION  09/17/2012   Procedure: MELANOMA EXCISION;  Surgeon: Imogene Burn. Georgette Dover, MD;  Location: [facility];  Service: General;  Laterality: Left;  wide excision of meanoma-posterior left leg   Patient Active Problem List   Diagnosis Date Noted   OSA (obstructive sleep apnea) 08/13/2020   Hyperlipidemia 04/16/2019   Vitamin D deficiency 04/16/2019   Depression, recurrent ([place]) 04/16/2019   GERD (gastroesophageal reflux disease) 05/14/2014   History of melanoma 08/29/2012   Physical exam 05/27/2012   Family history of malignant neoplasm of gastrointestinal tract 10/24/2011   ASTHMA 12/18/2008    PCP: Annye Asa  REFERRING PROVIDER: Lynne Leader  REFERRING DIAG: R shoulder pain  THERAPY DIAG:  Acute pain of  right shoulder  Cervicalgia  Rationale for Evaluation and Treatment Rehabilitation  ONSET DATE:   SUBJECTIVE:                                                                                                                                                                                      SUBJECTIVE STATEMENT: Pt having much improved pain.    Eval:Pt states  onset of pain in R anterior shoulder/pec for over 1 month. No incident to report onset of pain. She has seen chiropractor (today) and in the past for neck, thoracic, and lumbar adjustments.  Recently finished a round of prednisone, feels that it helped.   Pt works on Teaching laboratory technician, Health and safety inspector, has standing desk.    PERTINENT HISTORY:   PAIN:  Are you having pain? Yes: NPRS scale: 4-5 /10 Pain location: R anterior shoulder  Pain description: tender, sore  Aggravating factors: sitting or standing  Relieving factors: laying flat  PRECAUTIONS: None  WEIGHT BEARING RESTRICTIONS No  FALLS:  Has patient fallen in last 6 months? No  PLOF: Independent  PATIENT GOALS Decreased pain in shoulder  OBJECTIVE:   DIAGNOSTIC FINDINGS:  Neg [place] for shoulder   COGNITION:  Overall cognitive status: Within functional limits for tasks assessed  POSTURE: Fwd rounded shoulders bilaterally   UPPER EXTREMITY ROM:  Shoulder ROM: WNL,  Cervical ROM: WNL   UPPER EXTREMITY MMT:  Shoulder 4+/5 bil;   PALPATION:  Pain and tenderness in R pec, no pain at sternum, clavicle, or GHJ. No pain with cervical testing. Does have tightness in bil sub occipitals, with headaches.    TODAY'S TREATMENT:   06/07/22: Therapeutic Exercise: Aerobic: Supine:  shoulder ER at 90 deg, 2x10 bil;  Chest press 3 lb 2x10 bil;  Flys 3 lb 2 x 10 bil;  Seated:  Standing:    ER bil GTB x 20;  Rows Blue TB x 20;  Low rows BlueTB x 20;  Horizontal abd GTB x 15; Wall push ups 2 x 10;   Stretches: Supine pec stretch x 2 min;  Supine ER at 90 deg x 15; Doorway 30 sec x  3 bil;  Neuromuscular Re-education: Manual Therapy: Manual cervical traction;  SOR;   Self Care:     PATIENT EDUCATION: Education details: Reviewed HEP Person educated: Patient Education method: Explanation, Demonstration, Tactile cues, Verbal cues, and Handouts Education comprehension: verbalized understanding, returned demonstration, verbal cues required, tactile cues required, and needs further education   HOME EXERCISE PROGRAM:  Access Code: [email or web address]   ASSESSMENT:  CLINICAL IMPRESSION: 06/07/22: Pt progressing very well. No pain with palpation today, and no pain with ther ex. Doing well with more strengthening and pec activation, without increased pain. Plan to see 1x next week, will make d/c plan.    Eval:Patient presents with primary complaint of ongoing pain in R pec region. She has muscle tension and soreness with palpation today. She has minimal pain in shoulder or neck with testing today, but does have muscle tension in sub occipital region. Will benefit from treatment of neck as well to rule out as pain source. She will benefit from posture education and strengthening, as well as strengthening for shoulder complex. Pt with decreased ability for full functional activities due to pain and deficit at this time. Pt to benefit from skilled PT to improve.    OBJECTIVE IMPAIRMENTS decreased activity tolerance, decreased mobility, decreased strength, increased fascial restrictions, increased muscle spasms, impaired UE functional use, improper body mechanics, and pain.   ACTIVITY LIMITATIONS carrying, bending, sleeping, reach over head, and locomotion level  PARTICIPATION LIMITATIONS: meal prep, cleaning, laundry, driving, shopping, and community activity  PERSONAL FACTORS  none  are also affecting patient's functional outcome.   REHAB POTENTIAL: Good  CLINICAL DECISION MAKING: Stable/uncomplicated  EVALUATION COMPLEXITY: Low   GOALS: Goals reviewed with patient?  Yes  SHORT TERM GOALS: Target date: 06/06/2022    Pt  to be independent with initial HEP  Goal status: INITIAL  2.  Pt to report decreased pain in R chest to 0-3/10   Goal status: INITIAL    LONG TERM GOALS: Target date: 07/04/2022    Pt to be independent with final HEP  Goal status: INITIAL  2.  Pt to report decreased pain in R side of chest to 0-2/10 with activity   Goal status: INITIAL  3. Pt to demo ability for reach, lift, carry, without pain, to improve ability for IADLS and work duties.    Goal status: INITIAL  4.  Pt to report taking postural breaks from work , for chronic neck pain.   Goal status: INITIAL    PLAN: PT FREQUENCY: 1-2x/week  PT DURATION: 6 weeks  PLANNED INTERVENTIONS: Therapeutic exercises, Therapeutic activity, Neuromuscular re-education, Patient/Family education, Self Care, Joint mobilization, Joint manipulation, DME instructions, Dry Needling, Electrical stimulation, Spinal manipulation, Spinal mobilization, Cryotherapy, Moist heat, Taping, Vasopneumatic device, Traction, Ultrasound, Ionotophoresis '4mg'$ /ml Dexamethasone, and Manual therapy  PLAN FOR NEXT SESSION:   Lyndee Hensen, PT, DPT 12:59 PM  06/07/22

## 2022-06-08 ENCOUNTER — Encounter: Payer: Self-pay | Admitting: Physical Therapy

## 2022-06-14 ENCOUNTER — Encounter: Payer: Self-pay | Admitting: Physical Therapy

## 2022-06-15 ENCOUNTER — Encounter: Payer: Self-pay | Admitting: Physical Therapy

## 2022-06-16 ENCOUNTER — Encounter: Payer: Self-pay | Admitting: Physical Therapy

## 2022-06-16 ENCOUNTER — Ambulatory Visit (INDEPENDENT_AMBULATORY_CARE_PROVIDER_SITE_OTHER): Payer: BC Managed Care – PPO | Admitting: Physical Therapy

## 2022-06-16 DIAGNOSIS — M25511 Pain in right shoulder: Secondary | ICD-10-CM

## 2022-06-16 DIAGNOSIS — M542 Cervicalgia: Secondary | ICD-10-CM | POA: Diagnosis not present

## 2022-06-16 NOTE — Therapy (Addendum)
OUTPATIENT PHYSICAL THERAPY SHOULDER TREATMENT  Patient Name: Alexandra [name] MRN: [id number] DOB:1976/05/01, 46 y.o., female Today's Date: 06/16/2022   PT End of Session - 06/16/22 0930     Visit Number 6    Number of Visits 12    Date for PT Re-Evaluation 07/04/22    Authorization Type BCBS    PT Start Time [phone]    PT Stop Time 1000    PT Time Calculation (min) 29 min    Activity Tolerance Patient tolerated treatment well    Behavior During Therapy [facility] for tasks assessed/performed                Past Medical History:  Diagnosis Date   Abnormal Pap smear    cryo 1999   Allergy    Anemia    in college   Anxiety    Asthma    also has current cough,last attack <yr   Basal cell carcinoma of nose    Family history of malignant neoplasm of gastrointestinal tract    Gallbladder attack    Gallstone    GERD (gastroesophageal reflux disease)    Hemorrhoids    Hx: UTI (urinary tract infection)    Hyperlipidemia    borderline high   Melanoma of lower limb ([place])    Sleep apnea    uses cpap   Past Surgical History:  Procedure Laterality Date   BASAL CELL CARCINOMA EXCISION  2021   on nose   CHOLECYSTECTOMY     CRYOTHERAPY     cervical   MELANOMA EXCISION  09/17/2012   Procedure: MELANOMA EXCISION;  Surgeon: Imogene Burn. Georgette Dover, MD;  Location: [facility];  Service: General;  Laterality: Left;  wide excision of meanoma-posterior left leg   Patient Active Problem List   Diagnosis Date Noted   OSA (obstructive sleep apnea) 08/13/2020   Hyperlipidemia 04/16/2019   Vitamin D deficiency 04/16/2019   Depression, recurrent ([place]) 04/16/2019   GERD (gastroesophageal reflux disease) 05/14/2014   History of melanoma 08/29/2012   Physical exam 05/27/2012   Family history of malignant neoplasm of gastrointestinal tract 10/24/2011   ASTHMA 12/18/2008    PCP: Annye Asa  REFERRING PROVIDER: Lynne Leader  REFERRING DIAG: R shoulder pain  THERAPY DIAG:  Acute pain of  right shoulder  Cervicalgia  Rationale for Evaluation and Treatment Rehabilitation  ONSET DATE:   SUBJECTIVE:                                                                                                                                                                                      SUBJECTIVE STATEMENT: Pt has had no pain since last visit. She did  have some mild soreness near sternum, thinks she over stretched in doorway.   Eval:Pt states onset of pain in R anterior shoulder/pec for over 1 month. No incident to report onset of pain. She has seen chiropractor (today) and in the past for neck, thoracic, and lumbar adjustments.  Recently finished a round of prednisone, feels that it helped.   Pt works on Teaching laboratory technician, Health and safety inspector, has standing desk.    PERTINENT HISTORY:   PAIN:  Are you having pain? Yes: NPRS scale: 0-1/10 Pain location: R anterior shoulder  Pain description: tender, sore  Aggravating factors: sitting or standing  Relieving factors: laying flat  PRECAUTIONS: None  WEIGHT BEARING RESTRICTIONS No  FALLS:  Has patient fallen in last 6 months? No  PLOF: Independent  PATIENT GOALS Decreased pain in shoulder  OBJECTIVE:   DIAGNOSTIC FINDINGS:  Neg [place] for shoulder   COGNITION:  Overall cognitive status: Within functional limits for tasks assessed  POSTURE: Fwd rounded shoulders bilaterally   UPPER EXTREMITY ROM:  Shoulder ROM: WNL,  Cervical ROM: WNL   UPPER EXTREMITY MMT:  Shoulder 4+/5 bil;   PALPATION:  Pain and tenderness in R pec, no pain at sternum, clavicle, or GHJ. No pain with cervical testing. Does have tightness in bil sub occipitals, with headaches.    TODAY'S TREATMENT:   06/16/22: Therapeutic Exercise: Aerobic: Supine:   Chest press 3 lb 2x10 bil;  Flys 3 lb 2 x 10 bil;  Quadruped: SA presses x 20;  High plank 10 sec x 3;  Seated:  Standing:    ER bil GTB x 20;  Rows Blue TB x 20;  Wall push ups 2 x 10;   Stretches: Supine pec  stretch x 2 min;   Neuromuscular Re-education: Manual Therapy:     PATIENT EDUCATION: Education details: Reviewed and updated HEP Person educated: Patient Education method: Explanation, Demonstration, Tactile cues, Verbal cues, and Handouts Education comprehension: verbalized understanding, returned demonstration, verbal cues required, tactile cues required, and needs further education   HOME EXERCISE PROGRAM:  Access Code: [email or web address]   ASSESSMENT:  CLINICAL IMPRESSION: 06/16/22:  Pt has made very good progress. She has been pain free for 1-2 weeks. She is doing very well with HEP. She has met goals and is ready for d/c at this time. Will hold for 2 weeks, then d/c, pt will call if she has increased pain or problems .  Pt in agreement with plan.   Eval:Patient presents with primary complaint of ongoing pain in R pec region. She has muscle tension and soreness with palpation today. She has minimal pain in shoulder or neck with testing today, but does have muscle tension in sub occipital region. Will benefit from treatment of neck as well to rule out as pain source. She will benefit from posture education and strengthening, as well as strengthening for shoulder complex. Pt with decreased ability for full functional activities due to pain and deficit at this time. Pt to benefit from skilled PT to improve.    OBJECTIVE IMPAIRMENTS decreased activity tolerance, decreased mobility, decreased strength, increased fascial restrictions, increased muscle spasms, impaired UE functional use, improper body mechanics, and pain.   ACTIVITY LIMITATIONS carrying, bending, sleeping, reach over head, and locomotion level  PARTICIPATION LIMITATIONS: meal prep, cleaning, laundry, driving, shopping, and community activity  PERSONAL FACTORS  none  are also affecting patient's functional outcome.   REHAB POTENTIAL: Good  CLINICAL DECISION MAKING: Stable/uncomplicated  EVALUATION COMPLEXITY:  Low   GOALS: Goals reviewed with patient? Yes  SHORT TERM GOALS: Target date: 06/06/2022    Pt to be independent with initial HEP  Goal status: MET  2.  Pt to report decreased pain in R chest to 0-3/10   Goal status: MET    LONG TERM GOALS: Target date: 07/04/2022    Pt to be independent with final HEP  Goal status: IMET  2.  Pt to report decreased pain in R side of chest to 0-2/10 with activity   Goal status: MET  3. Pt to demo ability for reach, lift, carry, without pain, to improve ability for IADLS and work duties.    Goal status: MET  4.  Pt to report taking postural breaks from work , for chronic neck pain.   Goal status: MET    PLAN: PT FREQUENCY: 1-2x/week  PT DURATION: 6 weeks  PLANNED INTERVENTIONS: Therapeutic exercises, Therapeutic activity, Neuromuscular re-education, Patient/Family education, Self Care, Joint mobilization, Joint manipulation, DME instructions, Dry Needling, Electrical stimulation, Spinal manipulation, Spinal mobilization, Cryotherapy, Moist heat, Taping, Vasopneumatic device, Traction, Ultrasound, Ionotophoresis 52m/ml Dexamethasone, and Manual therapy  PLAN FOR NEXT SESSION:   LLyndee Hensen PT, DPT 10:01 AM  06/16/22    PHYSICAL THERAPY DISCHARGE SUMMARY  Visits from Start of Care: 6   Plan: Patient agrees to discharge.  Patient goals were  met. Patient is being discharged due to meeting the stated rehab goals.     LLyndee Hensen PT, DPT 2:29 PM  08/03/22

## 2022-06-19 ENCOUNTER — Encounter: Payer: Self-pay | Admitting: Physical Therapy

## 2022-06-19 DIAGNOSIS — M5386 Other specified dorsopathies, lumbar region: Secondary | ICD-10-CM | POA: Diagnosis not present

## 2022-06-19 DIAGNOSIS — M9908 Segmental and somatic dysfunction of rib cage: Secondary | ICD-10-CM | POA: Diagnosis not present

## 2022-06-19 DIAGNOSIS — M9902 Segmental and somatic dysfunction of thoracic region: Secondary | ICD-10-CM | POA: Diagnosis not present

## 2022-06-19 DIAGNOSIS — M9903 Segmental and somatic dysfunction of lumbar region: Secondary | ICD-10-CM | POA: Diagnosis not present

## 2022-06-20 DIAGNOSIS — F419 Anxiety disorder, unspecified: Secondary | ICD-10-CM | POA: Diagnosis not present

## 2022-06-21 ENCOUNTER — Ambulatory Visit: Payer: BC Managed Care – PPO | Admitting: Family Medicine

## 2022-06-21 ENCOUNTER — Encounter: Payer: Self-pay | Admitting: Physical Therapy

## 2022-06-22 ENCOUNTER — Ambulatory Visit (INDEPENDENT_AMBULATORY_CARE_PROVIDER_SITE_OTHER): Payer: BC Managed Care – PPO | Admitting: Family Medicine

## 2022-06-22 VITALS — BP 118/76 | HR 83 | Ht 65.0 in | Wt 161.0 lb

## 2022-06-22 DIAGNOSIS — G8929 Other chronic pain: Secondary | ICD-10-CM

## 2022-06-22 DIAGNOSIS — M25511 Pain in right shoulder: Secondary | ICD-10-CM | POA: Diagnosis not present

## 2022-06-22 NOTE — Patient Instructions (Signed)
Thank you for coming in today.   Continue the home exercises.   Recheck as needed.

## 2022-06-22 NOTE — Progress Notes (Unsigned)
   I, Peterson Lombard, LAT, ATC acting as a scribe for Lynne Leader, MD.  Alexandra [name] is a 46 y.o. female who presents to [place] at [facility] today for f/u R anterior chest wall pain w/ pain radiating into her R upper arm, unclear etiology. Pt was last seen by Dr. Georgina Snell on 05/11/22 and was prescribed gabapentin and prednisone and was referred to PT, completing 6 visits. Today, pt reports R arm pain is a lot better. Pt notes pain came back 1 week ago, but resolved w/ doing HEP.  Dx imaging: 05/11/22 C-spine & R shoulder XR  Pertinent review of systems: No fevers or chills  Relevant historical information: Asthma and depression.  Sleep apnea. Personal history of a stage II melanoma.  Exam:  BP 118/76   Pulse 83   Ht '5\' 5"'$  (1.651 m)   Wt 161 lb (73 kg)   SpO2 96%   BMI 26.79 kg/m  General: Well Developed, well nourished, and in no acute distress.   MSK: Right shoulder: Normal-appearing normal motion normal strength.   Assessment and Plan: 46 y.o. female with right shoulder and arm pain.  Etiology remains somewhat unclear.  Please see my notes from the last visit for little more details on my differential diagnosis.  However she has had good resolution in symptoms with physical therapy.  Plan to continue home exercise program and check back as needed.  Can refer back to PT if needed.  Could proceed with advanced imaging if needed. Reviewed potential plans and neck steps if needed. Total encounter time 20 minutes including face-to-face time with the patient and, reviewing past medical record, and charting on the date of service.     Discussed warning signs or symptoms. Please see discharge instructions. Patient expresses understanding.   The above documentation has been reviewed and is accurate and complete Lynne Leader, M.D.

## 2022-07-06 DIAGNOSIS — Z85828 Personal history of other malignant neoplasm of skin: Secondary | ICD-10-CM | POA: Diagnosis not present

## 2022-07-06 DIAGNOSIS — L814 Other melanin hyperpigmentation: Secondary | ICD-10-CM | POA: Diagnosis not present

## 2022-07-06 DIAGNOSIS — D225 Melanocytic nevi of trunk: Secondary | ICD-10-CM | POA: Diagnosis not present

## 2022-07-06 DIAGNOSIS — L821 Other seborrheic keratosis: Secondary | ICD-10-CM | POA: Diagnosis not present

## 2022-07-24 DIAGNOSIS — M5386 Other specified dorsopathies, lumbar region: Secondary | ICD-10-CM | POA: Diagnosis not present

## 2022-07-24 DIAGNOSIS — M9908 Segmental and somatic dysfunction of rib cage: Secondary | ICD-10-CM | POA: Diagnosis not present

## 2022-07-24 DIAGNOSIS — M9902 Segmental and somatic dysfunction of thoracic region: Secondary | ICD-10-CM | POA: Diagnosis not present

## 2022-07-24 DIAGNOSIS — M9903 Segmental and somatic dysfunction of lumbar region: Secondary | ICD-10-CM | POA: Diagnosis not present

## 2022-07-25 DIAGNOSIS — F419 Anxiety disorder, unspecified: Secondary | ICD-10-CM | POA: Diagnosis not present

## 2022-08-21 DIAGNOSIS — M9903 Segmental and somatic dysfunction of lumbar region: Secondary | ICD-10-CM | POA: Diagnosis not present

## 2022-08-21 DIAGNOSIS — M5386 Other specified dorsopathies, lumbar region: Secondary | ICD-10-CM | POA: Diagnosis not present

## 2022-08-21 DIAGNOSIS — M9908 Segmental and somatic dysfunction of rib cage: Secondary | ICD-10-CM | POA: Diagnosis not present

## 2022-08-21 DIAGNOSIS — M9902 Segmental and somatic dysfunction of thoracic region: Secondary | ICD-10-CM | POA: Diagnosis not present

## 2022-08-25 DIAGNOSIS — F419 Anxiety disorder, unspecified: Secondary | ICD-10-CM | POA: Diagnosis not present

## 2022-09-18 DIAGNOSIS — M9902 Segmental and somatic dysfunction of thoracic region: Secondary | ICD-10-CM | POA: Diagnosis not present

## 2022-09-18 DIAGNOSIS — M5386 Other specified dorsopathies, lumbar region: Secondary | ICD-10-CM | POA: Diagnosis not present

## 2022-09-18 DIAGNOSIS — M9903 Segmental and somatic dysfunction of lumbar region: Secondary | ICD-10-CM | POA: Diagnosis not present

## 2022-09-18 DIAGNOSIS — F32A Depression, unspecified: Secondary | ICD-10-CM | POA: Diagnosis not present

## 2022-09-18 DIAGNOSIS — M9908 Segmental and somatic dysfunction of rib cage: Secondary | ICD-10-CM | POA: Diagnosis not present

## 2022-09-27 DIAGNOSIS — M9908 Segmental and somatic dysfunction of rib cage: Secondary | ICD-10-CM | POA: Diagnosis not present

## 2022-09-27 DIAGNOSIS — M5386 Other specified dorsopathies, lumbar region: Secondary | ICD-10-CM | POA: Diagnosis not present

## 2022-09-27 DIAGNOSIS — M9902 Segmental and somatic dysfunction of thoracic region: Secondary | ICD-10-CM | POA: Diagnosis not present

## 2022-09-27 DIAGNOSIS — M9903 Segmental and somatic dysfunction of lumbar region: Secondary | ICD-10-CM | POA: Diagnosis not present

## 2022-10-23 DIAGNOSIS — M9903 Segmental and somatic dysfunction of lumbar region: Secondary | ICD-10-CM | POA: Diagnosis not present

## 2022-10-23 DIAGNOSIS — M9902 Segmental and somatic dysfunction of thoracic region: Secondary | ICD-10-CM | POA: Diagnosis not present

## 2022-10-23 DIAGNOSIS — M5386 Other specified dorsopathies, lumbar region: Secondary | ICD-10-CM | POA: Diagnosis not present

## 2022-10-23 DIAGNOSIS — M9908 Segmental and somatic dysfunction of rib cage: Secondary | ICD-10-CM | POA: Diagnosis not present

## 2022-10-26 ENCOUNTER — Ambulatory Visit: Payer: BC Managed Care – PPO | Admitting: Nurse Practitioner

## 2022-10-27 DIAGNOSIS — F32A Depression, unspecified: Secondary | ICD-10-CM | POA: Diagnosis not present

## 2022-10-30 ENCOUNTER — Encounter: Payer: Self-pay | Admitting: Nurse Practitioner

## 2022-10-30 ENCOUNTER — Ambulatory Visit (INDEPENDENT_AMBULATORY_CARE_PROVIDER_SITE_OTHER): Payer: BC Managed Care – PPO | Admitting: Nurse Practitioner

## 2022-10-30 VITALS — BP 98/64 | HR 97 | Ht 65.0 in | Wt 166.2 lb

## 2022-10-30 DIAGNOSIS — G4733 Obstructive sleep apnea (adult) (pediatric): Secondary | ICD-10-CM | POA: Diagnosis not present

## 2022-10-30 NOTE — Patient Instructions (Signed)
Continue to use CPAP every night, minimum of 4-6 hours a night.  Change equipment every 30 days or as directed by DME. Wash your tubing with warm soap and water daily, hang to dry. Wash humidifier portion weekly.  Be aware of reduced alertness and do not drive or operate heavy machinery if experiencing this or drowsiness.  Exercise encouraged, as tolerated. Notify if persistent daytime sleepiness occurs even with consistent use of CPAP.  Follow up in one year with Dr. Halford Chessman or Alanson Aly. If symptoms worsen, please contact office for sooner follow up.

## 2022-10-30 NOTE — Progress Notes (Signed)
$'@Patient'K$  ID: Alexandra [name], female    DOB: 1976/02/20, 47 y.o.   MRN: [id number]  Chief Complaint  Patient presents with   Follow-up    Pt f/u for CPAP therapy, she states she has no current concerns    Referring provider: Midge Minium, MD  HPI: 47 year old female, never smoker followed for mild obstructive sleep apnea on CPAP.  She is a patient of Dr. Juanetta Gosling and was last seen in office on 10/26/2021 by Warner Mccreedy NP.  Past medical history significant for asthma, GERD, hyperlipidemia, depression.   TEST/EVENTS:  08/04/2020 HST: AHI 12.2/h, SPO2 low 89%  10/21/2020: OV with Mack NP.  Recently started CPAP therapy with excellent compliance.  Using nasal pillows mask.  No significant leaks.  Continued CPAP therapy with follow-up in 1 year.  10/26/2021: OV with Hjalmar Ballengee NP for annual follow-up of mild obstructive sleep apnea on CPAP.  She denies any daytime fatigue symptoms and reports feeling significantly better since being on CPAP therapy.  Her breathing is stable and she does not have any issues with shortness of breath, wheezing or cough related to her asthma.  She denies orthopnea, PND, chest pain or lower extremity swelling.  She denies drowsy driving or narcolepsy.  She continues on CPAP therapy nightly at 4 to 20 cmH2O without any issues.  She changes out her equipment as directed.  She does get her own supplies and does not go through medical supply company.  Overall she feels well and offers no further complaints  10/30/2022: Today - follow up Patient presents today for one year follow up. She has been doing great with CPAP since she was here last. She sleeps well with it and wakes feeling rested. She denies any daytime fatigue symptoms, breakthrough snoring, morning headaches, sleep parasomnias/paralysis, drowsy driving. No issues with her asthma recently. No concerns or complaints today.   09/30/2022-10/29/2022: CPAP 4-20 cmH2O 26/30 days; 83% >4 hr; av usage 7 hr 6 min Pressure  95th 9.2 Leaks 95th 0 L/min AHI 0.2  Allergies  Allergen Reactions   Sulfamethoxazole-Trimethoprim Nausea And Vomiting    REACTION: hives   Adhesive [Tape] Rash    Steri strips    Immunization History  Administered Date(s) Administered   Hepatitis B, adult 12/25/2017, 01/28/2018   Hepb-cpg 08/11/2020   Influenza-Unspecified 08/26/2021   PFIZER(Purple Top)SARS-COV-2 Vaccination 12/22/2019, 01/13/2020, 09/08/2020   Pfizer Covid-19 Vaccine Bivalent Booster 52yr & up 08/19/2021   Rho (D) Immune Globulin 07/09/2011   Tdap 12/08/2013    Past Medical History:  Diagnosis Date   Abnormal Pap smear    cryo 1999   Allergy    Anemia    in college   Anxiety    Asthma    also has current cough,last attack <yr   Basal cell carcinoma of nose    Family history of malignant neoplasm of gastrointestinal tract    Gallbladder attack    Gallstone    GERD (gastroesophageal reflux disease)    Hemorrhoids    Hx: UTI (urinary tract infection)    Hyperlipidemia    borderline high   Melanoma of lower limb (HCC)    Sleep apnea    uses cpap    Tobacco History: Social History   Tobacco Use  Smoking Status Never  Smokeless Tobacco Never   Counseling given: Not Answered   Outpatient Medications Prior to Visit  Medication Sig Dispense Refill   albuterol (VENTOLIN HFA) 108 (90 Base) MCG/ACT inhaler INHALE 2 PUFFS INTO THE LUNGS EVERY  6 HOURS AS NEEDED FOR WHEEZE 8.5 each 1   Cholecalciferol (VITAMIN D3 PO) Take by mouth.     COENZYME Q10 PO Take by mouth.     loratadine (CLARITIN) 10 MG tablet Take 10 mg by mouth daily.     magnesium 30 MG tablet Take 30 mg by mouth daily.     Red Yeast Rice Extract (RED YEAST RICE PO) Take by mouth.     Facility-Administered Medications Prior to Visit  Medication Dose Route Frequency Provider Last Rate Last Admin   0.9 %  sodium chloride infusion  500 mL Intravenous Once Nandigam, Kavitha V, MD         Review of Systems:   Constitutional: No  weight loss or gain, night sweats, fevers, chills, fatigue, or lassitude. HEENT: No headaches, difficulty swallowing, sneezing, itching, ear ache, nasal congestion, or post nasal drip CV:  No chest pain, orthopnea, PND, swelling in lower extremities, anasarca, dizziness, palpitations, syncope Resp: No shortness of breath with exertion or at rest. No excess mucus or change in color of mucus. No productive or non-productive. No hemoptysis. No wheezing.  No chest wall deformity GI:  No heartburn, indigestion, abdominal pain, loss of appetite Skin: No rash, lesions, ulcerations MSK:  No joint pain or swelling.   Neuro: No dizziness or lightheadedness.  Psych: No depression or anxiety. Mood stable.     Physical Exam:  BP 98/64   Pulse 97   Ht '5\' 5"'$  (1.651 m)   Wt 166 lb 3.2 oz (75.4 kg)   SpO2 97%   BMI 27.66 kg/m   GEN: Pleasant, interactive, well-nourished, well-appearing; in no acute distress. HEENT:  Normocephalic and atraumatic. PERRLA. Sclera white. Nasal turbinates pink, moist and patent bilaterally. No rhinorrhea present. Oropharynx pink and moist, without exudate or edema. No lesions, ulcerations, or postnasal drip. Mallampati II NECK:  Supple w/ fair ROM. No JVD present. Normal carotid impulses w/o bruits. Thyroid symmetrical with no goiter or nodules palpated. No lymphadenopathy.   CV: RRR, no m/r/g, no peripheral edema. Pulses intact, +2 bilaterally. No cyanosis, pallor or clubbing. PULMONARY:  Unlabored, regular breathing. Clear bilaterally A&P w/o wheezes/rales/rhonchi. No accessory muscle use. No dullness to percussion. GI: BS present and normoactive. Soft, non-tender to palpation. No organomegaly or masses detected.  MSK: No erythema, warmth or tenderness. Cap refil <2 sec all extrem. No deformities or joint swelling noted.  Neuro: A/Ox3. No focal deficits noted.   Skin: Warm, no lesions or rashe Psych: Normal affect and behavior. Judgement and thought content appropriate.      Lab Results:  CBC    Component Value Date/Time   WBC 8.7 05/01/2022 0829   RBC 4.43 05/01/2022 0829   HGB 13.2 05/01/2022 0829   HCT 39.6 05/01/2022 0829   PLT 318.0 05/01/2022 0829   MCV 89.5 05/01/2022 0829   MCV 90.1 05/08/2014 1423   MCH 29.6 04/16/2019 1611   MCHC 33.4 05/01/2022 0829   RDW 13.9 05/01/2022 0829   LYMPHSABS 2.8 05/01/2022 0829   MONOABS 0.6 05/01/2022 0829   EOSABS 0.2 05/01/2022 0829   BASOSABS 0.1 05/01/2022 0829    BMET    Component Value Date/Time   NA 136 05/01/2022 0829   NA 141 07/11/2016 0000   K 4.0 05/01/2022 0829   CL 102 05/01/2022 0829   CO2 25 05/01/2022 0829   GLUCOSE 94 05/01/2022 0829   BUN 12 05/01/2022 0829   BUN 12 07/11/2016 0000   CREATININE 0.71 05/01/2022 0829  CREATININE 0.66 04/16/2019 1611   CALCIUM 9.4 05/01/2022 0829   GFRNONAA >90 09/16/2012 0833   GFRAA >90 09/16/2012 0833    BNP No results found for: "BNP"   Imaging:  No results found.        No data to display          No results found for: "NITRICOXIDE"      Assessment & Plan:   OSA (obstructive sleep apnea) Mild OSA with AHI 12.2/h maintained on CPAP therapy. She has excellent compliance and continues to receive good benefit from use. No significant leaks and residual AHI 0.2/h. She changes out her equipment regularly. Cautioned on safe driving practices.  Patient Instructions  Continue to use CPAP every night, minimum of 4-6 hours a night.  Change equipment every 30 days or as directed by DME. Wash your tubing with warm soap and water daily, hang to dry. Wash humidifier portion weekly.  Be aware of reduced alertness and do not drive or operate heavy machinery if experiencing this or drowsiness.  Exercise encouraged, as tolerated. Notify if persistent daytime sleepiness occurs even with consistent use of CPAP.  Follow up in one year with Dr. Halford Chessman or Alanson Aly. If symptoms worsen, please contact office for sooner follow up.    23 min spent on case with >50% of face to face time  Clayton Bibles, NP 10/30/2022  Pt aware and understands NP's role.

## 2022-10-30 NOTE — Assessment & Plan Note (Addendum)
Mild OSA with AHI 12.2/h maintained on CPAP therapy. She has excellent compliance and continues to receive good benefit from use. No significant leaks and residual AHI 0.2/h. She changes out her equipment regularly. Cautioned on safe driving practices.  Patient Instructions  Continue to use CPAP every night, minimum of 4-6 hours a night.  Change equipment every 30 days or as directed by DME. Wash your tubing with warm soap and water daily, hang to dry. Wash humidifier portion weekly.  Be aware of reduced alertness and do not drive or operate heavy machinery if experiencing this or drowsiness.  Exercise encouraged, as tolerated. Notify if persistent daytime sleepiness occurs even with consistent use of CPAP.  Follow up in one year with Dr. Halford Chessman or Alanson Aly. If symptoms worsen, please contact office for sooner follow up.

## 2022-11-03 NOTE — Progress Notes (Signed)
Reviewed and agree with assessment/plan.   Chesley Mires, MD [facility] Pulmonary/Critical Care 11/03/2022, 7:20 AM Pager:  [phone]

## 2022-11-17 DIAGNOSIS — Z6827 Body mass index (BMI) 27.0-27.9, adult: Secondary | ICD-10-CM | POA: Diagnosis not present

## 2022-11-17 DIAGNOSIS — Z1231 Encounter for screening mammogram for malignant neoplasm of breast: Secondary | ICD-10-CM | POA: Diagnosis not present

## 2022-11-17 DIAGNOSIS — F32A Depression, unspecified: Secondary | ICD-10-CM | POA: Diagnosis not present

## 2022-11-17 DIAGNOSIS — Z01419 Encounter for gynecological examination (general) (routine) without abnormal findings: Secondary | ICD-10-CM | POA: Diagnosis not present

## 2022-11-17 LAB — HM MAMMOGRAPHY

## 2022-11-20 DIAGNOSIS — M9902 Segmental and somatic dysfunction of thoracic region: Secondary | ICD-10-CM | POA: Diagnosis not present

## 2022-11-20 DIAGNOSIS — M9908 Segmental and somatic dysfunction of rib cage: Secondary | ICD-10-CM | POA: Diagnosis not present

## 2022-11-20 DIAGNOSIS — M9903 Segmental and somatic dysfunction of lumbar region: Secondary | ICD-10-CM | POA: Diagnosis not present

## 2022-11-20 DIAGNOSIS — M5386 Other specified dorsopathies, lumbar region: Secondary | ICD-10-CM | POA: Diagnosis not present

## 2022-12-04 DIAGNOSIS — F32A Depression, unspecified: Secondary | ICD-10-CM | POA: Diagnosis not present

## 2022-12-04 DIAGNOSIS — F411 Generalized anxiety disorder: Secondary | ICD-10-CM | POA: Diagnosis not present

## 2022-12-18 DIAGNOSIS — M9908 Segmental and somatic dysfunction of rib cage: Secondary | ICD-10-CM | POA: Diagnosis not present

## 2022-12-18 DIAGNOSIS — M5386 Other specified dorsopathies, lumbar region: Secondary | ICD-10-CM | POA: Diagnosis not present

## 2022-12-18 DIAGNOSIS — M9902 Segmental and somatic dysfunction of thoracic region: Secondary | ICD-10-CM | POA: Diagnosis not present

## 2022-12-18 DIAGNOSIS — M9903 Segmental and somatic dysfunction of lumbar region: Secondary | ICD-10-CM | POA: Diagnosis not present

## 2022-12-29 DIAGNOSIS — F32A Depression, unspecified: Secondary | ICD-10-CM | POA: Diagnosis not present

## 2023-01-17 DIAGNOSIS — M9902 Segmental and somatic dysfunction of thoracic region: Secondary | ICD-10-CM | POA: Diagnosis not present

## 2023-01-17 DIAGNOSIS — M9908 Segmental and somatic dysfunction of rib cage: Secondary | ICD-10-CM | POA: Diagnosis not present

## 2023-01-17 DIAGNOSIS — M9903 Segmental and somatic dysfunction of lumbar region: Secondary | ICD-10-CM | POA: Diagnosis not present

## 2023-01-17 DIAGNOSIS — M5386 Other specified dorsopathies, lumbar region: Secondary | ICD-10-CM | POA: Diagnosis not present

## 2023-01-31 DIAGNOSIS — F32A Depression, unspecified: Secondary | ICD-10-CM | POA: Diagnosis not present

## 2023-02-14 DIAGNOSIS — M9902 Segmental and somatic dysfunction of thoracic region: Secondary | ICD-10-CM | POA: Diagnosis not present

## 2023-02-14 DIAGNOSIS — M9903 Segmental and somatic dysfunction of lumbar region: Secondary | ICD-10-CM | POA: Diagnosis not present

## 2023-02-14 DIAGNOSIS — F32A Depression, unspecified: Secondary | ICD-10-CM | POA: Diagnosis not present

## 2023-02-14 DIAGNOSIS — M5386 Other specified dorsopathies, lumbar region: Secondary | ICD-10-CM | POA: Diagnosis not present

## 2023-02-14 DIAGNOSIS — M9908 Segmental and somatic dysfunction of rib cage: Secondary | ICD-10-CM | POA: Diagnosis not present

## 2023-03-14 DIAGNOSIS — M9908 Segmental and somatic dysfunction of rib cage: Secondary | ICD-10-CM | POA: Diagnosis not present

## 2023-03-14 DIAGNOSIS — M9902 Segmental and somatic dysfunction of thoracic region: Secondary | ICD-10-CM | POA: Diagnosis not present

## 2023-03-14 DIAGNOSIS — M9903 Segmental and somatic dysfunction of lumbar region: Secondary | ICD-10-CM | POA: Diagnosis not present

## 2023-03-14 DIAGNOSIS — M5386 Other specified dorsopathies, lumbar region: Secondary | ICD-10-CM | POA: Diagnosis not present

## 2023-04-11 DIAGNOSIS — M9908 Segmental and somatic dysfunction of rib cage: Secondary | ICD-10-CM | POA: Diagnosis not present

## 2023-04-11 DIAGNOSIS — M5386 Other specified dorsopathies, lumbar region: Secondary | ICD-10-CM | POA: Diagnosis not present

## 2023-04-11 DIAGNOSIS — M9902 Segmental and somatic dysfunction of thoracic region: Secondary | ICD-10-CM | POA: Diagnosis not present

## 2023-04-11 DIAGNOSIS — M9903 Segmental and somatic dysfunction of lumbar region: Secondary | ICD-10-CM | POA: Diagnosis not present

## 2023-05-02 DIAGNOSIS — F32A Depression, unspecified: Secondary | ICD-10-CM | POA: Diagnosis not present

## 2023-05-03 ENCOUNTER — Encounter: Payer: Self-pay | Admitting: Family Medicine

## 2023-05-03 ENCOUNTER — Ambulatory Visit (INDEPENDENT_AMBULATORY_CARE_PROVIDER_SITE_OTHER): Payer: BC Managed Care – PPO | Admitting: Family Medicine

## 2023-05-03 VITALS — BP 112/72 | HR 80 | Ht 65.0 in | Wt 162.0 lb

## 2023-05-03 DIAGNOSIS — E785 Hyperlipidemia, unspecified: Secondary | ICD-10-CM

## 2023-05-03 DIAGNOSIS — Z0000 Encounter for general adult medical examination without abnormal findings: Secondary | ICD-10-CM

## 2023-05-03 DIAGNOSIS — E559 Vitamin D deficiency, unspecified: Secondary | ICD-10-CM

## 2023-05-03 LAB — LIPID PANEL
Cholesterol: 193 mg/dL (ref 0–200)
HDL: 46.9 mg/dL (ref >39.00)
LDL Cholesterol: 116 mg/dL — ABNORMAL HIGH (ref 0–99)
NonHDL: 145.92
Total CHOL/HDL Ratio: 4
Triglycerides: 149 mg/dL (ref 0.0–149.0)
VLDL: 29.8 mg/dL (ref 0.0–40.0)

## 2023-05-03 LAB — CBC WITH DIFFERENTIAL/PLATELET
Basophils Absolute: 0.1 10*3/uL (ref 0.0–0.1)
Basophils Relative: 1 % (ref 0.0–3.0)
Eosinophils Absolute: 0.1 10*3/uL (ref 0.0–0.7)
Eosinophils Relative: 2.1 % (ref 0.0–5.0)
HCT: 41 % (ref 36.0–46.0)
Hemoglobin: 13.2 g/dL (ref 12.0–15.0)
Lymphocytes Relative: 37.6 % (ref 12.0–46.0)
Lymphs Abs: 2.3 10*3/uL (ref 0.7–4.0)
MCHC: 32.1 g/dL (ref 30.0–36.0)
MCV: 90.6 fl (ref 78.0–100.0)
Monocytes Absolute: 0.5 10*3/uL (ref 0.1–1.0)
Monocytes Relative: 7.9 % (ref 3.0–12.0)
Neutro Abs: 3.2 10*3/uL (ref 1.4–7.7)
Neutrophils Relative %: 51.4 % (ref 43.0–77.0)
Platelets: 313 10*3/uL (ref 150.0–400.0)
RBC: 4.53 Mil/uL (ref 3.87–5.11)
RDW: 13.8 % (ref 11.5–15.5)
WBC: 6.2 10*3/uL (ref 4.0–10.5)

## 2023-05-03 LAB — HEPATIC FUNCTION PANEL
ALT: 26 U/L (ref 0–35)
AST: 19 U/L (ref 0–37)
Albumin: 4.6 g/dL (ref 3.5–5.2)
Alkaline Phosphatase: 87 U/L (ref 39–117)
Bilirubin, Direct: 0.1 mg/dL (ref 0.0–0.3)
Total Bilirubin: 0.4 mg/dL (ref 0.2–1.2)
Total Protein: 7.7 g/dL (ref 6.0–8.3)

## 2023-05-03 LAB — BASIC METABOLIC PANEL
BUN: 12 mg/dL (ref 6–23)
CO2: 27 mEq/L (ref 19–32)
Calcium: 9.4 mg/dL (ref 8.4–10.5)
Chloride: 103 mEq/L (ref 96–112)
Creatinine, Ser: 0.71 mg/dL (ref 0.40–1.20)
GFR: 100.67 mL/min (ref >60.00)
Glucose, Bld: 105 mg/dL — ABNORMAL HIGH (ref 70–99)
Potassium: 4.3 mEq/L (ref 3.5–5.1)
Sodium: 138 mEq/L (ref 135–145)

## 2023-05-03 LAB — TSH: TSH: 1.91 u[IU]/mL (ref 0.35–5.50)

## 2023-05-03 LAB — VITAMIN D 25 HYDROXY (VIT D DEFICIENCY, FRACTURES): VITD: 64.26 ng/mL (ref 30.00–100.00)

## 2023-05-03 NOTE — Assessment & Plan Note (Signed)
Attempting to control w/ Red Yeast Rice.  Check labs and see if medication is needed

## 2023-05-03 NOTE — Progress Notes (Signed)
   Subjective:    Patient ID: Alexandra [name], female    DOB: [birth year], 47 y.o.   MRN: [id number]  HPI CPE- UTD on pap, mammo, colonoscopy, Tdap  Patient Care Team    Relationship Specialty Notifications Start End  Sheliah Hatch, MD PCP - General   09/21/10   Shea Evans, MD Consulting Physician Obstetrics and Gynecology  04/19/20     Health Maintenance  Topic Date Due   COVID-19 Vaccine (5 - 2023-24 season) 06/09/2022   INFLUENZA VACCINE  05/10/2023   MAMMOGRAM  11/18/2023   DTaP/Tdap/Td (2 - Td or Tdap) 12/09/2023   Colonoscopy  01/07/2024   PAP SMEAR-Modifier  11/14/2024   Hepatitis C Screening  Completed   HIV Screening  Completed   HPV VACCINES  Aged Out     Review of Systems Patient reports no vision/ hearing changes, adenopathy,fever, weight change,  persistant/recurrent hoarseness , swallowing issues, chest pain, palpitations, edema, persistant/recurrent cough, hemoptysis, dyspnea (rest/exertional/paroxysmal nocturnal), gastrointestinal bleeding (melena, rectal bleeding), abdominal pain, significant heartburn, bowel changes, GU symptoms (dysuria, hematuria, incontinence), Gyn symptoms (abnormal  bleeding, pain),  syncope, focal weakness, memory loss, numbness & tingling, skin/hair/nail changes, abnormal bruising or bleeding, anxiety, or depression.     Objective:   Physical Exam . General Appearance:    Alert, cooperative, no distress, appears stated age  Head:    Normocephalic, without obvious abnormality, atraumatic  Eyes:    PERRL, conjunctiva/corneas clear, EOM's intact both eyes  Ears:    Normal TM's and external ear canals, both ears  Nose:   Nares normal, septum midline, mucosa normal, no drainage    or sinus tenderness  Throat:   Lips, mucosa, and tongue normal; teeth and gums normal  Neck:   Supple, symmetrical, trachea midline, no adenopathy;    Thyroid: no enlargement/tenderness/nodules  Back:     Symmetric, no curvature, ROM normal, no CVA  tenderness  Lungs:     Clear to auscultation bilaterally, respirations unlabored  Chest Wall:    No tenderness or deformity   Heart:    Regular rate and rhythm, S1 and S2 normal, no murmur, rub   or gallop  Breast Exam:    Deferred to GYN  Abdomen:     Soft, non-tender, bowel sounds active all four quadrants,    no masses, no organomegaly  Genitalia:    Deferred to GYN  Rectal:    Extremities:   Extremities normal, atraumatic, no cyanosis or edema  Pulses:   2+ and symmetric all extremities  Skin:   Skin color, texture, turgor normal, no rashes or lesions  Lymph nodes:   Cervical, supraclavicular, and axillary nodes normal  Neurologic:   CNII-XII intact, normal strength, sensation and reflexes    throughout          Assessment & Plan:

## 2023-05-03 NOTE — Patient Instructions (Addendum)
Follow up in 1 year or as needed We'll notify you of your lab results and make any changes if needed Keep up the good work on healthy diet and regular exercise- you look great! Try Evening Primrose and/or Estroven for the menopausal symptoms Call with any questions or concerns Stay Safe!  Stay Healthy! Enjoy the rest of your summer!!!

## 2023-05-03 NOTE — Assessment & Plan Note (Signed)
Check labs and replete prn. 

## 2023-05-03 NOTE — Assessment & Plan Note (Signed)
Pt's PE WNL.  UTD on pap, mammo, colonoscopy, Tdap.  She is down 4 lbs.  Applauded her efforts.  Check labs.  Encouraged low carb diet and regular exercise.

## 2023-05-09 DIAGNOSIS — M9903 Segmental and somatic dysfunction of lumbar region: Secondary | ICD-10-CM | POA: Diagnosis not present

## 2023-05-09 DIAGNOSIS — M9902 Segmental and somatic dysfunction of thoracic region: Secondary | ICD-10-CM | POA: Diagnosis not present

## 2023-05-09 DIAGNOSIS — M5386 Other specified dorsopathies, lumbar region: Secondary | ICD-10-CM | POA: Diagnosis not present

## 2023-05-09 DIAGNOSIS — M9908 Segmental and somatic dysfunction of rib cage: Secondary | ICD-10-CM | POA: Diagnosis not present

## 2023-05-16 DIAGNOSIS — F32A Depression, unspecified: Secondary | ICD-10-CM | POA: Diagnosis not present

## 2023-06-06 DIAGNOSIS — M9903 Segmental and somatic dysfunction of lumbar region: Secondary | ICD-10-CM | POA: Diagnosis not present

## 2023-06-06 DIAGNOSIS — M5386 Other specified dorsopathies, lumbar region: Secondary | ICD-10-CM | POA: Diagnosis not present

## 2023-06-06 DIAGNOSIS — M9902 Segmental and somatic dysfunction of thoracic region: Secondary | ICD-10-CM | POA: Diagnosis not present

## 2023-06-06 DIAGNOSIS — M9908 Segmental and somatic dysfunction of rib cage: Secondary | ICD-10-CM | POA: Diagnosis not present

## 2023-07-04 DIAGNOSIS — M9903 Segmental and somatic dysfunction of lumbar region: Secondary | ICD-10-CM | POA: Diagnosis not present

## 2023-07-04 DIAGNOSIS — M9902 Segmental and somatic dysfunction of thoracic region: Secondary | ICD-10-CM | POA: Diagnosis not present

## 2023-07-04 DIAGNOSIS — M9908 Segmental and somatic dysfunction of rib cage: Secondary | ICD-10-CM | POA: Diagnosis not present

## 2023-07-04 DIAGNOSIS — M5386 Other specified dorsopathies, lumbar region: Secondary | ICD-10-CM | POA: Diagnosis not present

## 2023-07-04 DIAGNOSIS — F32A Depression, unspecified: Secondary | ICD-10-CM | POA: Diagnosis not present

## 2023-07-06 ENCOUNTER — Ambulatory Visit (INDEPENDENT_AMBULATORY_CARE_PROVIDER_SITE_OTHER): Payer: BC Managed Care – PPO | Admitting: Family Medicine

## 2023-07-06 ENCOUNTER — Encounter: Payer: Self-pay | Admitting: Family Medicine

## 2023-07-06 VITALS — BP 118/72 | HR 90 | Temp 98.2°F | Wt 170.0 lb

## 2023-07-06 DIAGNOSIS — J4541 Moderate persistent asthma with (acute) exacerbation: Secondary | ICD-10-CM | POA: Diagnosis not present

## 2023-07-06 MED ORDER — PREDNISONE 10 MG PO TABS: ORAL_TABLET | ORAL | 0 refills | Status: DC

## 2023-07-06 MED ORDER — BUDESONIDE 90 MCG/ACT IN AEPB: 2.0000 | INHALATION_SPRAY | Freq: Two times a day (BID) | RESPIRATORY_TRACT | 1 refills | Status: AC

## 2023-07-06 NOTE — Patient Instructions (Addendum)
Follow up as needed or as scheduled START the Prednisone as directed- 3 pills at the same time x3 days, then 2 pills at the same time x3 days, then 1 pill daily.  Take w/ food  USE the Budesonide inhaler twice daily until feeling better Call with any questions or concerns Hang in there!!!

## 2023-07-06 NOTE — Progress Notes (Unsigned)
   Subjective:    Patient ID: Alexandra [name], female    DOB: 1976/09/17, 47 y.o.   MRN: [id number]  HPI Asthma- pt had viral URI earlier this month.  She reports since then her asthma has been flared.  Using inhaler more frequently.  Has had shortness of breath.  Breathing feels tight.  No audible wheezing.  + cough.     Review of Systems For ROS see HPI     Objective:   Physical Exam Vitals reviewed.  Constitutional:      General: She is not in acute distress.    Appearance: Normal appearance. She is not ill-appearing.  HENT:     Head: Normocephalic and atraumatic.     Right Ear: Tympanic membrane and ear canal normal.     Left Ear: Tympanic membrane and ear canal normal.     Nose: No congestion.     Mouth/Throat:     Mouth: Mucous membranes are moist.     Pharynx: Oropharynx is clear.  Eyes:     Extraocular Movements: Extraocular movements intact.     Conjunctiva/sclera: Conjunctivae normal.  Cardiovascular:     Rate and Rhythm: Normal rate and regular rhythm.  Pulmonary:     Effort: Pulmonary effort is normal. No respiratory distress.     Breath sounds: No rhonchi.     Comments: Decreased air movement throughout w/ end expiratory 'squeak' Musculoskeletal:     Cervical back: Neck supple.  Lymphadenopathy:     Cervical: No cervical adenopathy.  Skin:    General: Skin is warm and dry.  Neurological:     General: No focal deficit present.     Mental Status: She is alert and oriented to person, place, and time.  Psychiatric:        Mood and Affect: Mood normal.        Behavior: Behavior normal.        Thought Content: Thought content normal.           Assessment & Plan:  Moderate persistent asthma- deteriorated.  Current exacerbation triggered by viral URI.  Add ICS until feeling better and start prednisone taper to open airways.  Reviewed supportive care and red flags that should prompt return.  Pt expressed understanding and is in agreement w/ plan.

## 2023-07-19 DIAGNOSIS — F32A Depression, unspecified: Secondary | ICD-10-CM | POA: Diagnosis not present

## 2023-08-01 DIAGNOSIS — L280 Lichen simplex chronicus: Secondary | ICD-10-CM | POA: Diagnosis not present

## 2023-08-01 DIAGNOSIS — D225 Melanocytic nevi of trunk: Secondary | ICD-10-CM | POA: Diagnosis not present

## 2023-08-01 DIAGNOSIS — L538 Other specified erythematous conditions: Secondary | ICD-10-CM | POA: Diagnosis not present

## 2023-08-01 DIAGNOSIS — M9903 Segmental and somatic dysfunction of lumbar region: Secondary | ICD-10-CM | POA: Diagnosis not present

## 2023-08-01 DIAGNOSIS — M9901 Segmental and somatic dysfunction of cervical region: Secondary | ICD-10-CM | POA: Diagnosis not present

## 2023-08-01 DIAGNOSIS — F32A Depression, unspecified: Secondary | ICD-10-CM | POA: Diagnosis not present

## 2023-08-01 DIAGNOSIS — L814 Other melanin hyperpigmentation: Secondary | ICD-10-CM | POA: Diagnosis not present

## 2023-08-01 DIAGNOSIS — M9902 Segmental and somatic dysfunction of thoracic region: Secondary | ICD-10-CM | POA: Diagnosis not present

## 2023-08-01 DIAGNOSIS — M5386 Other specified dorsopathies, lumbar region: Secondary | ICD-10-CM | POA: Diagnosis not present

## 2023-08-01 DIAGNOSIS — L821 Other seborrheic keratosis: Secondary | ICD-10-CM | POA: Diagnosis not present

## 2023-08-01 DIAGNOSIS — D485 Neoplasm of uncertain behavior of skin: Secondary | ICD-10-CM | POA: Diagnosis not present

## 2023-08-15 DIAGNOSIS — F32A Depression, unspecified: Secondary | ICD-10-CM | POA: Diagnosis not present

## 2023-08-29 DIAGNOSIS — M9903 Segmental and somatic dysfunction of lumbar region: Secondary | ICD-10-CM | POA: Diagnosis not present

## 2023-08-29 DIAGNOSIS — M9901 Segmental and somatic dysfunction of cervical region: Secondary | ICD-10-CM | POA: Diagnosis not present

## 2023-08-29 DIAGNOSIS — M5386 Other specified dorsopathies, lumbar region: Secondary | ICD-10-CM | POA: Diagnosis not present

## 2023-08-29 DIAGNOSIS — F32A Depression, unspecified: Secondary | ICD-10-CM | POA: Diagnosis not present

## 2023-08-29 DIAGNOSIS — M9902 Segmental and somatic dysfunction of thoracic region: Secondary | ICD-10-CM | POA: Diagnosis not present

## 2023-09-19 DIAGNOSIS — F32A Depression, unspecified: Secondary | ICD-10-CM | POA: Diagnosis not present

## 2023-09-26 DIAGNOSIS — M9902 Segmental and somatic dysfunction of thoracic region: Secondary | ICD-10-CM | POA: Diagnosis not present

## 2023-09-26 DIAGNOSIS — M5386 Other specified dorsopathies, lumbar region: Secondary | ICD-10-CM | POA: Diagnosis not present

## 2023-09-26 DIAGNOSIS — M9901 Segmental and somatic dysfunction of cervical region: Secondary | ICD-10-CM | POA: Diagnosis not present

## 2023-09-26 DIAGNOSIS — M9903 Segmental and somatic dysfunction of lumbar region: Secondary | ICD-10-CM | POA: Diagnosis not present

## 2023-10-17 DIAGNOSIS — F32A Depression, unspecified: Secondary | ICD-10-CM | POA: Diagnosis not present

## 2023-10-23 ENCOUNTER — Other Ambulatory Visit: Payer: Self-pay | Admitting: Obstetrics and Gynecology

## 2023-10-23 DIAGNOSIS — Z1231 Encounter for screening mammogram for malignant neoplasm of breast: Secondary | ICD-10-CM

## 2023-10-24 DIAGNOSIS — M5386 Other specified dorsopathies, lumbar region: Secondary | ICD-10-CM | POA: Diagnosis not present

## 2023-10-24 DIAGNOSIS — M9902 Segmental and somatic dysfunction of thoracic region: Secondary | ICD-10-CM | POA: Diagnosis not present

## 2023-10-24 DIAGNOSIS — M9901 Segmental and somatic dysfunction of cervical region: Secondary | ICD-10-CM | POA: Diagnosis not present

## 2023-10-24 DIAGNOSIS — M9903 Segmental and somatic dysfunction of lumbar region: Secondary | ICD-10-CM | POA: Diagnosis not present

## 2023-10-30 ENCOUNTER — Telehealth: Payer: Self-pay

## 2023-10-30 NOTE — Telephone Encounter (Signed)
I called and spoke with the pt in regards to her appointment with Florentina Addison, NP. I was unable to print a DL for her, and pt verified that she does have an SD card inside the machine and she will bring it with her tomorrow for her appointment. NFN.

## 2023-10-31 ENCOUNTER — Encounter: Payer: Self-pay | Admitting: Nurse Practitioner

## 2023-10-31 ENCOUNTER — Telehealth (INDEPENDENT_AMBULATORY_CARE_PROVIDER_SITE_OTHER): Payer: BC Managed Care – PPO | Admitting: Nurse Practitioner

## 2023-10-31 DIAGNOSIS — G4733 Obstructive sleep apnea (adult) (pediatric): Secondary | ICD-10-CM

## 2023-10-31 NOTE — Patient Instructions (Signed)
Continue to use CPAP every night, minimum of 4-6 hours a night.  Change equipment as directed. Wash your tubing with warm soap and water daily, hang to dry. Wash humidifier portion weekly. Use bottled, distilled water and change daily Be aware of reduced alertness and do not drive or operate heavy machinery if experiencing this or drowsiness.  Exercise encouraged, as tolerated. Healthy weight management discussed.  Avoid or decrease alcohol consumption and medications that make you more sleepy, if possible. Notify if persistent daytime sleepiness occurs even with consistent use of PAP therapy.  Follow up in 1 year with Alexandra Lynel Forester,NP, or sooner, if needed

## 2023-10-31 NOTE — Assessment & Plan Note (Signed)
Mild OSA on CPAP. Excellent compliance per her report. She will bring SD card to office for download. Receives benefit from use. Will be due for new machine late 2026. Aware of proper care/use. Understands risks of untreated OSA. Safe driving practices reviewed.   Patient Instructions  Continue to use CPAP every night, minimum of 4-6 hours a night.  Change equipment as directed. Wash your tubing with warm soap and water daily, hang to dry. Wash humidifier portion weekly. Use bottled, distilled water and change daily Be aware of reduced alertness and do not drive or operate heavy machinery if experiencing this or drowsiness.  Exercise encouraged, as tolerated. Healthy weight management discussed.  Avoid or decrease alcohol consumption and medications that make you more sleepy, if possible. Notify if persistent daytime sleepiness occurs even with consistent use of PAP therapy.  Follow up in 1 year with Katie Azoria Abbett,NP, or sooner, if needed

## 2023-10-31 NOTE — Progress Notes (Signed)
Patient ID: Alexandra [name], female     DOB: [birth year], 48 y.o.      MRN: [id number]  No chief complaint on file.   Virtual Visit via Video Note  I connected with HAWI BUTLER on 10/31/23 at  9:00 AM EST by a video enabled telemedicine application and verified that I am speaking with the correct person using two identifiers.  Location: Patient: Home Provider: Office   I discussed the limitations of evaluation and management by telemedicine and the availability of in person appointments. The patient expressed understanding and agreed to proceed.  History of Present Illness: 48 year old female, never smoker followed for mild obstructive sleep apnea on CPAP.  She is a former patient of Dr. Evlyn Courier and was last seen in office on 10/30/2022.  Past medical history significant for asthma, GERD, hyperlipidemia, depression.    TEST/EVENTS:  08/04/2020 HST: AHI 12.2/h, SPO2 low 89%   10/21/2020: OV with Mack NP.  Recently started CPAP therapy with excellent compliance.  Using nasal pillows mask.  No significant leaks.  Continued CPAP therapy with follow-up in 1 year.   10/26/2021: OV with Carmie Lanpher NP for annual follow-up of mild obstructive sleep apnea on CPAP.  She denies any daytime fatigue symptoms and reports feeling significantly better since being on CPAP therapy.  Her breathing is stable and she does not have any issues with shortness of breath, wheezing or cough related to her asthma.  She denies orthopnea, PND, chest pain or lower extremity swelling.  She denies drowsy driving or narcolepsy.  She continues on CPAP therapy nightly at 4 to 20 cmH2O without any issues.  She changes out her equipment as directed.  She does get her own supplies and does not go through medical supply company.  Overall she feels well and offers no further complaints   10/30/2022: OV with Lex Linhares NP for one year follow up. She has been doing great with CPAP since she was here last. She sleeps well with it and wakes  feeling rested. She denies any daytime fatigue symptoms, breakthrough snoring, morning headaches, sleep parasomnias/paralysis, drowsy driving. No issues with her asthma recently. No concerns or complaints today.  09/30/2022-10/29/2022: CPAP 4-20 cmH2O 26/30 days; 83% >4 hr; av usage 7 hr 6 min Pressure 95th 9.2 Leaks 95th 0 L/min AHI 0.2  10/31/2023: Today - follow up Patient presents today for follow up. She has mild sleep apnea, on CPAP therapy. Has been doing well with this. No concerns or complaints. Mask fits well. No issues with leaks. She sleeps well and feels rested during the day. Good energy levels. Will bring SD card by office for download.   Allergies  Allergen Reactions   Sulfamethoxazole-Trimethoprim Hives   Adhesive [Tape] Rash    Steri strips   Immunization History  Administered Date(s) Administered   Hepatitis B, ADULT 12/25/2017, 01/28/2018   Hepb-cpg 08/11/2020   Influenza-Unspecified 08/26/2021   PFIZER(Purple Top)SARS-COV-2 Vaccination 12/22/2019, 01/13/2020, 09/08/2020   Pfizer Covid-19 Vaccine Bivalent Booster 11yrs & up 08/19/2021   Rho (D) Immune Globulin 07/09/2011   Tdap 12/08/2013   Past Medical History:  Diagnosis Date   Abnormal Pap smear    cryo 1999   Allergy    Anemia    in college   Anxiety    Asthma    also has current cough,last attack <yr   Basal cell carcinoma of nose    Family history of malignant neoplasm of gastrointestinal tract    Gallbladder attack    Gallstone  GERD (gastroesophageal reflux disease)    Hemorrhoids    Hx: UTI (urinary tract infection)    Hyperlipidemia    borderline high   Melanoma of lower limb (HCC)    Sleep apnea    uses cpap    Tobacco History: Social History   Tobacco Use  Smoking Status Never  Smokeless Tobacco Never   Counseling given: Not Answered   Outpatient Medications Prior to Visit  Medication Sig Dispense Refill   albuterol (VENTOLIN HFA) 108 (90 Base) MCG/ACT inhaler INHALE 2  PUFFS INTO THE LUNGS EVERY 6 HOURS AS NEEDED FOR WHEEZE 8.5 each 1   Budesonide 90 MCG/ACT inhaler Inhale 2 puffs into the lungs 2 (two) times daily. 1 each 1   CANNABIDIOL PO Take 20 mg by mouth daily in the afternoon.     Cholecalciferol (D3 2000) 50 MCG (2000 UT) CAPS Take 100 Units by mouth daily.     Coenzyme Q10 (COQ10 PO) Take 300 mg by mouth daily.     loratadine (CLARITIN) 10 MG tablet Take 10 mg by mouth daily.     MAGNESIUM GLYCINATE PO Take 240 mg by mouth daily at 6 (six) AM.     predniSONE (DELTASONE) 10 MG tablet 3 tabs x3 days and then 2 tabs x3 days and then 1 tab x3 days.  Take w/ food. 18 tablet 0   Red Yeast Rice Extract (RED YEAST RICE PO) Take 1,200 mg by mouth daily. Red yeast rice with Calcium (30 mg)     Facility-Administered Medications Prior to Visit  Medication Dose Route Frequency Provider Last Rate Last Admin   0.9 %  sodium chloride infusion  500 mL Intravenous Once Nandigam, Kavitha V, MD         Review of Systems:   Constitutional: No weight loss or gain, night sweats, fevers, chills, fatigue, or lassitude. HEENT: No headaches, difficulty swallowing, tooth/dental problems, or sore throat. No sneezing, itching, ear ache, nasal congestion, or post nasal drip CV:  No chest pain, orthopnea, PND, swelling in lower extremities, anasarca, dizziness, palpitations, syncope Resp: No shortness of breath with exertion or at rest. No excess mucus or change in color of mucus. No productive or non-productive. No hemoptysis. No wheezing.  No chest wall deformity GI:  No heartburn, indigestion GU: No nocturia  MSK:  No joint pain or swelling.  Neuro: No dizziness or lightheadedness.  Psych: No depression or anxiety. Mood stable.   Observations/Objective: Patient is well-developed, well-nourished in no acute distress. A&Ox3. Resting comfortably at home. Unlabored breathing. Speech is clear and coherent with logical content.   Assessment and Plan: OSA (obstructive sleep  apnea) Mild OSA on CPAP. Excellent compliance per her report. She will bring SD card to office for download. Receives benefit from use. Will be due for new machine late 2026. Aware of proper care/use. Understands risks of untreated OSA. Safe driving practices reviewed.   Patient Instructions  Continue to use CPAP every night, minimum of 4-6 hours a night.  Change equipment as directed. Wash your tubing with warm soap and water daily, hang to dry. Wash humidifier portion weekly. Use bottled, distilled water and change daily Be aware of reduced alertness and do not drive or operate heavy machinery if experiencing this or drowsiness.  Exercise encouraged, as tolerated. Healthy weight management discussed.  Avoid or decrease alcohol consumption and medications that make you more sleepy, if possible. Notify if persistent daytime sleepiness occurs even with consistent use of PAP therapy.  Follow up in  1 year with Florentina Addison Bren Steers,NP, or sooner, if needed    I discussed the assessment and treatment plan with the patient. The patient was provided an opportunity to ask questions and all were answered. The patient agreed with the plan and demonstrated an understanding of the instructions.   The patient was advised to call back or seek an in-person evaluation if the symptoms worsen or if the condition fails to improve as anticipated.  I provided 21 minutes of non-face-to-face time during this encounter.   Noemi Chapel, NP

## 2023-11-21 DIAGNOSIS — F32A Depression, unspecified: Secondary | ICD-10-CM | POA: Diagnosis not present

## 2023-11-21 DIAGNOSIS — M5386 Other specified dorsopathies, lumbar region: Secondary | ICD-10-CM | POA: Diagnosis not present

## 2023-11-21 DIAGNOSIS — M9903 Segmental and somatic dysfunction of lumbar region: Secondary | ICD-10-CM | POA: Diagnosis not present

## 2023-11-21 DIAGNOSIS — M9902 Segmental and somatic dysfunction of thoracic region: Secondary | ICD-10-CM | POA: Diagnosis not present

## 2023-11-21 DIAGNOSIS — M9901 Segmental and somatic dysfunction of cervical region: Secondary | ICD-10-CM | POA: Diagnosis not present

## 2023-12-05 ENCOUNTER — Ambulatory Visit
Admission: RE | Admit: 2023-12-05 | Discharge: 2023-12-05 | Disposition: A | Discharge status: Home / Self Care | Payer: BC Managed Care – PPO | Source: Ambulatory Visit | Attending: Obstetrics and Gynecology | Admitting: Obstetrics and Gynecology

## 2023-12-05 DIAGNOSIS — Z1330 Encounter for screening examination for mental health and behavioral disorders, unspecified: Secondary | ICD-10-CM | POA: Diagnosis not present

## 2023-12-05 DIAGNOSIS — Z1231 Encounter for screening mammogram for malignant neoplasm of breast: Secondary | ICD-10-CM | POA: Diagnosis not present

## 2023-12-05 DIAGNOSIS — Z01419 Encounter for gynecological examination (general) (routine) without abnormal findings: Secondary | ICD-10-CM | POA: Diagnosis not present

## 2023-12-10 DIAGNOSIS — F32A Depression, unspecified: Secondary | ICD-10-CM | POA: Diagnosis not present

## 2023-12-19 DIAGNOSIS — M9901 Segmental and somatic dysfunction of cervical region: Secondary | ICD-10-CM | POA: Diagnosis not present

## 2023-12-19 DIAGNOSIS — M5386 Other specified dorsopathies, lumbar region: Secondary | ICD-10-CM | POA: Diagnosis not present

## 2023-12-19 DIAGNOSIS — M9903 Segmental and somatic dysfunction of lumbar region: Secondary | ICD-10-CM | POA: Diagnosis not present

## 2023-12-19 DIAGNOSIS — M9902 Segmental and somatic dysfunction of thoracic region: Secondary | ICD-10-CM | POA: Diagnosis not present

## 2023-12-31 ENCOUNTER — Telehealth: Payer: Self-pay | Admitting: *Deleted

## 2023-12-31 NOTE — Telephone Encounter (Signed)
 Unable to get a DL off of the SD card that the patient dropped off at the office, I tried all 3 computers in the office.  I called and spoke with the patient and advised her that I could not get any data off the card and she would need to take it to her DME company to see if they can get the data off of it.  I advised her that there may be a problem with the card.  Card placed back up at the front desk.  Nothing further needed.

## 2024-01-01 ENCOUNTER — Encounter: Payer: Self-pay | Admitting: Gastroenterology

## 2024-01-04 DIAGNOSIS — G4700 Insomnia, unspecified: Secondary | ICD-10-CM | POA: Diagnosis not present

## 2024-01-04 DIAGNOSIS — N898 Other specified noninflammatory disorders of vagina: Secondary | ICD-10-CM | POA: Diagnosis not present

## 2024-01-04 DIAGNOSIS — N951 Menopausal and female climacteric states: Secondary | ICD-10-CM | POA: Diagnosis not present

## 2024-01-07 DIAGNOSIS — F32A Depression, unspecified: Secondary | ICD-10-CM | POA: Diagnosis not present

## 2024-01-16 DIAGNOSIS — R519 Headache, unspecified: Secondary | ICD-10-CM | POA: Diagnosis not present

## 2024-01-16 DIAGNOSIS — M9908 Segmental and somatic dysfunction of rib cage: Secondary | ICD-10-CM | POA: Diagnosis not present

## 2024-01-16 DIAGNOSIS — M9902 Segmental and somatic dysfunction of thoracic region: Secondary | ICD-10-CM | POA: Diagnosis not present

## 2024-01-16 DIAGNOSIS — M9901 Segmental and somatic dysfunction of cervical region: Secondary | ICD-10-CM | POA: Diagnosis not present

## 2024-01-22 DIAGNOSIS — F341 Dysthymic disorder: Secondary | ICD-10-CM | POA: Diagnosis not present

## 2024-01-30 DIAGNOSIS — F32A Depression, unspecified: Secondary | ICD-10-CM | POA: Diagnosis not present

## 2024-02-12 DIAGNOSIS — F341 Dysthymic disorder: Secondary | ICD-10-CM | POA: Diagnosis not present

## 2024-02-13 DIAGNOSIS — R519 Headache, unspecified: Secondary | ICD-10-CM | POA: Diagnosis not present

## 2024-02-13 DIAGNOSIS — M9902 Segmental and somatic dysfunction of thoracic region: Secondary | ICD-10-CM | POA: Diagnosis not present

## 2024-02-13 DIAGNOSIS — M9901 Segmental and somatic dysfunction of cervical region: Secondary | ICD-10-CM | POA: Diagnosis not present

## 2024-02-13 DIAGNOSIS — M9908 Segmental and somatic dysfunction of rib cage: Secondary | ICD-10-CM | POA: Diagnosis not present

## 2024-03-06 DIAGNOSIS — F32A Depression, unspecified: Secondary | ICD-10-CM | POA: Diagnosis not present

## 2024-03-10 ENCOUNTER — Ambulatory Visit (AMBULATORY_SURGERY_CENTER)

## 2024-03-10 ENCOUNTER — Encounter: Payer: Self-pay | Admitting: Gastroenterology

## 2024-03-10 VITALS — Ht 65.0 in | Wt 165.0 lb

## 2024-03-10 DIAGNOSIS — Z860100 Personal history of colon polyps, unspecified: Secondary | ICD-10-CM

## 2024-03-10 DIAGNOSIS — Z800 Family history of malignant neoplasm of digestive organs: Secondary | ICD-10-CM

## 2024-03-10 MED ORDER — NA SULFATE-K SULFATE-MG SULF 17.5-3.13-1.6 GM/177ML PO SOLN
1.0000 | Freq: Once | ORAL | 0 refills | Status: AC
Start: 2024-03-10 — End: 2024-03-10

## 2024-03-10 NOTE — Progress Notes (Signed)

## 2024-03-12 DIAGNOSIS — F341 Dysthymic disorder: Secondary | ICD-10-CM | POA: Diagnosis not present

## 2024-03-19 DIAGNOSIS — M9902 Segmental and somatic dysfunction of thoracic region: Secondary | ICD-10-CM | POA: Diagnosis not present

## 2024-03-19 DIAGNOSIS — M9901 Segmental and somatic dysfunction of cervical region: Secondary | ICD-10-CM | POA: Diagnosis not present

## 2024-03-19 DIAGNOSIS — R519 Headache, unspecified: Secondary | ICD-10-CM | POA: Diagnosis not present

## 2024-03-19 DIAGNOSIS — M9908 Segmental and somatic dysfunction of rib cage: Secondary | ICD-10-CM | POA: Diagnosis not present

## 2024-03-24 ENCOUNTER — Ambulatory Visit (AMBULATORY_SURGERY_CENTER): Admitting: Gastroenterology

## 2024-03-24 ENCOUNTER — Encounter: Payer: Self-pay | Admitting: Gastroenterology

## 2024-03-24 VITALS — BP 122/72 | HR 60 | Temp 98.2°F | Resp 14 | Ht 65.0 in | Wt 165.0 lb

## 2024-03-24 DIAGNOSIS — K648 Other hemorrhoids: Secondary | ICD-10-CM

## 2024-03-24 DIAGNOSIS — K644 Residual hemorrhoidal skin tags: Secondary | ICD-10-CM | POA: Diagnosis not present

## 2024-03-24 DIAGNOSIS — Z800 Family history of malignant neoplasm of digestive organs: Secondary | ICD-10-CM

## 2024-03-24 DIAGNOSIS — Z860101 Personal history of adenomatous and serrated colon polyps: Secondary | ICD-10-CM

## 2024-03-24 DIAGNOSIS — Z1211 Encounter for screening for malignant neoplasm of colon: Secondary | ICD-10-CM

## 2024-03-24 DIAGNOSIS — Z860100 Personal history of colon polyps, unspecified: Secondary | ICD-10-CM

## 2024-03-24 MED ORDER — SODIUM CHLORIDE 0.9 % IV SOLN: 500.0000 mL | Freq: Once | INTRAVENOUS | Status: DC

## 2024-03-24 NOTE — Patient Instructions (Signed)

## 2024-03-24 NOTE — Op Note (Signed)
 [facility] Patient Name: Alexandra [name] Procedure Date: 03/24/2024 12:09 PM MRN: [id number] Endoscopist: Sergio Dandy , MD, [id number] Age: 48 Referring MD:  Date of Birth: 1976-08-12 Gender: Female Account #: [id number] Procedure:                Colonoscopy Indications:              Screening in patient at increased risk: Family                            history of 1st-degree relative with colorectal                            cancer, High risk colon cancer surveillance:                            Personal history of adenoma (10 mm or greater in                            size) Medicines:                Monitored Anesthesia Care Procedure:                Pre-Anesthesia Assessment:                           - Prior to the procedure, a History and Physical                            was performed, and patient medications and                            allergies were reviewed. The patient's tolerance of                            previous anesthesia was also reviewed. The risks                            and benefits of the procedure and the sedation                            options and risks were discussed with the patient.                            All questions were answered, and informed consent                            was obtained. Prior Anticoagulants: The patient has                            taken no anticoagulant or antiplatelet agents. ASA                            Grade Assessment: II - A patient with mild systemic  disease. After reviewing the risks and benefits,                            the patient was deemed in satisfactory condition to                            undergo the procedure.                           After obtaining informed consent, the colonoscope                            was passed under direct vision. Throughout the                            procedure, the patient's blood pressure, pulse, and                             oxygen saturations were monitored continuously. The                            Olympus Scope [id number] was introduced through the                            anus and advanced to the the cecum, identified by                            appendiceal orifice and ileocecal valve. The                            colonoscopy was performed without difficulty. The                            patient tolerated the procedure well. The quality                            of the bowel preparation was good. The ileocecal                            valve, appendiceal orifice, and rectum were                            photographed. Scope In: 12:14:13 PM Scope Out: 12:28:28 PM Scope Withdrawal Time: 0 hours 10 minutes 14 seconds  Total Procedure Duration: 0 hours 14 minutes 15 seconds  Findings:                 The perianal and digital rectal examinations were                            normal.                           Non-bleeding external and internal hemorrhoids were  found during retroflexion. The hemorrhoids were                            small.                           The exam was otherwise without abnormality. Complications:            No immediate complications. Estimated Blood Loss:     Estimated blood loss was minimal. Impression:               - Non-bleeding external and internal hemorrhoids.                           - The examination was otherwise normal.                           - No specimens collected. Recommendation:           - Patient has a contact number available for                            emergencies. The signs and symptoms of potential                            delayed complications were discussed with the                            patient. Return to normal activities tomorrow.                            Written discharge instructions were provided to the                            patient.                           - Resume  previous diet.                           - Continue present medications.                           - Repeat colonoscopy in 5 years for surveillance. Margherita Collyer V. Zykeria Laguardia, MD 03/24/2024 12:32:30 PM This report has been signed electronically.

## 2024-03-24 NOTE — Progress Notes (Unsigned)
 Vss nad trans to pacu

## 2024-03-24 NOTE — Progress Notes (Unsigned)
 Pt's states no medical or surgical changes since previsit or office visit.

## 2024-03-24 NOTE — Progress Notes (Unsigned)
 [facility] History and Physical   Primary Care Physician:  Jess Morita, MD   Reason for Procedure:  History of adenomatous colon polyps  Plan:    Surveillance colonoscopy with possible interventions as needed     HPI: Alexandra [name] is a very pleasant 48 y.o. female here for surveillance colonoscopy. Denies any nausea, vomiting, abdominal pain, melena or bright red blood per rectum  The risks and benefits as well as alternatives of endoscopic procedure(s) have been discussed and reviewed. All questions answered. The patient agrees to proceed.    Past Medical History:  Diagnosis Date   Abnormal Pap smear    cryo 1999   Allergy    Anemia    in college   Anxiety    Asthma    also has current cough,last attack <yr   Basal cell carcinoma of nose    Family history of malignant neoplasm of gastrointestinal tract    Gallbladder attack    Gallstone    GERD (gastroesophageal reflux disease)    Hemorrhoids    Hx: UTI (urinary tract infection)    Hyperlipidemia    borderline high   Melanoma of lower limb (HCC)    Sleep apnea    uses cpap    Past Surgical History:  Procedure Laterality Date   BASAL CELL CARCINOMA EXCISION  2021   on nose   CHOLECYSTECTOMY     COLONOSCOPY     CRYOTHERAPY     cervical   MELANOMA EXCISION  09/17/2012   Procedure: MELANOMA EXCISION;  Surgeon: Kari Otto. Eli Grizzle, MD;  Location: [facility] OR;  Service: General;  Laterality: Left;  wide excision of meanoma-posterior left leg    Prior to Admission medications   Medication Sig Start Date End Date Taking? Authorizing Provider  CANNABIDIOL PO Take 20 mg by mouth daily in the afternoon.   Yes [provider]  Cholecalciferol (D3 2000) 50 MCG (2000 UT) CAPS Take 100 Units by mouth daily.   Yes [provider]  Coenzyme Q10 (COQ10 PO) Take 300 mg by mouth daily.   Yes [provider]  loratadine (CLARITIN) 10 MG tablet Take 10 mg by mouth daily.   Yes  [provider]  MAGNESIUM  GLYCINATE PO Take 240 mg by mouth daily at 6 (six) AM.   Yes [provider]  Red Yeast Rice Extract (RED YEAST RICE PO) Take 1,200 mg by mouth daily. Red yeast rice with Calcium  (30 mg)   Yes [provider]  vitamin k 100 MCG tablet Take 100 mcg by mouth daily.   Yes [provider]  albuterol  (VENTOLIN  HFA) 108 (90 Base) MCG/ACT inhaler INHALE 2 PUFFS INTO THE LUNGS EVERY 6 HOURS AS NEEDED FOR WHEEZE 03/20/22   Tabori, Katherine E, MD  Budesonide  90 MCG/ACT inhaler Inhale 2 puffs into the lungs 2 (two) times daily. Patient not taking: Reported on 03/10/2024 07/06/23   Tabori, Katherine E, MD  [facility] 0.05-0.14 MG/DAY Place 1 patch onto the skin 2 (two) times a week. 03/04/24   [provider]  estradiol (ESTRACE) 0.1 MG/GM vaginal cream Place 1 Applicatorful vaginally 3 (three) times a week. Patient not taking: Reported on 03/10/2024 03/04/24   [provider]    Current Outpatient Medications  Medication Sig Dispense Refill   CANNABIDIOL PO Take 20 mg by mouth daily in the afternoon.     Cholecalciferol (D3 2000) 50 MCG (2000 UT) CAPS Take 100 Units by mouth daily.     Coenzyme  Q10 (COQ10 PO) Take 300 mg by mouth daily.     loratadine (CLARITIN) 10 MG tablet Take 10 mg by mouth daily.     MAGNESIUM  GLYCINATE PO Take 240 mg by mouth daily at 6 (six) AM.     Red Yeast Rice Extract (RED YEAST RICE PO) Take 1,200 mg by mouth daily. Red yeast rice with Calcium  (30 mg)     vitamin k 100 MCG tablet Take 100 mcg by mouth daily.     albuterol  (VENTOLIN  HFA) 108 (90 Base) MCG/ACT inhaler INHALE 2 PUFFS INTO THE LUNGS EVERY 6 HOURS AS NEEDED FOR WHEEZE 8.5 each 1   Budesonide  90 MCG/ACT inhaler Inhale 2 puffs into the lungs 2 (two) times daily. (Patient not taking: Reported on 03/10/2024) 1 each 1   COMBIPATCH 0.05-0.14 MG/DAY Place 1 patch onto the skin 2 (two) times a week.     estradiol (ESTRACE) 0.1 MG/GM vaginal cream  Place 1 Applicatorful vaginally 3 (three) times a week. (Patient not taking: Reported on 03/10/2024)     Current Facility-Administered Medications  Medication Dose Route Frequency Provider Last Rate Last Admin   0.9 %  sodium chloride  infusion  500 mL Intravenous Once Irvan Tiedt V, MD       0.9 %  sodium chloride  infusion  500 mL Intravenous Once Andy Moye V, MD        Allergies as of 03/24/2024 - Review Complete 03/24/2024  Allergen Reaction Noted   Adhesive [tape] Rash 09/16/2012   Sulfamethoxazole-trimethoprim Hives     Family History  Problem Relation Age of Onset   Hypertension Father    Diabetes Father    Hyperlipidemia Father    Colon polyps Father    Breast cancer Maternal Grandmother    Heart disease Maternal Grandfather    Diabetes Paternal Grandfather    Colon cancer Paternal Grandfather        age of dx unknown   Mental illness Mother        multiple personality disorder   Depression Mother    Esophageal cancer Neg Hx    Stomach cancer Neg Hx    Rectal cancer Neg Hx     Social History   Socioeconomic History   Marital status: Married    Spouse name: Zoila Hines   Number of children: 2   Years of education: college   Highest education level: Bachelor's degree (e.g., BA, AB, BS)  Occupational History   Occupation: Environmental manager    Comment: self-employed (weddings)  Tobacco Use   Smoking status: Never   Smokeless tobacco: Never  Vaping Use   Vaping status: Never Used  Substance and Sexual Activity   Alcohol use: No    Alcohol/week: 0.0 standard drinks of alcohol   Drug use: No   Sexual activity: Yes    Partners: Male    Birth control/protection: None    Comment: Husband s/p vasectomy  Other Topics Concern   Not on file  Social History Narrative   No caffeine.    Lives with her husband and their two children.   Minimal contact with her mother.   Father lives in [place], [place].   One brother is in [place], [place], and one in [place], [place].    Social Drivers of Health   Financial Resource Strain: Low Risk  (07/03/2023)   Overall Financial Resource Strain (CARDIA)    Difficulty of Paying Living Expenses: Not hard at all  Food Insecurity: No Food Insecurity (07/03/2023)   Hunger Vital Sign  Worried About Programme researcher, broadcasting/film/video in the Last Year: Never true    Ran Out of Food in the Last Year: Never true  Transportation Needs: No Transportation Needs (07/03/2023)   PRAPARE - Administrator, Civil Service (Medical): No    Lack of Transportation (Non-Medical): No  Physical Activity: Insufficiently Active (07/03/2023)   Exercise Vital Sign    Days of Exercise per Week: 3 days    Minutes of Exercise per Session: 20 min  Stress: No Stress Concern Present (07/03/2023)   [employer] of Occupational Health - Occupational Stress Questionnaire    Feeling of Stress : Only a little  Social Connections: Moderately Integrated (07/03/2023)   Social Connection and Isolation Panel    Frequency of Communication with Friends and Family: Once a week    Frequency of Social Gatherings with Friends and Family: Twice a week    Attends Religious Services: Never    Database administrator or Organizations: Yes    Attends Engineer, structural: More than 4 times per year    Marital Status: Married  Catering manager Violence: Not on file    Review of Systems:  All other review of systems negative except as mentioned in the HPI.  Physical Exam: Vital signs in last 24 hours: BP 131/83   Pulse 83   Temp 98.2 F (36.8 C)   Ht 5' 5 (1.651 m)   Wt 165 lb (74.8 kg)   LMP 02/21/2024   SpO2 100%   BMI 27.46 kg/m  General:   Alert, NAD Lungs:  Clear .   Heart:  Regular rate and rhythm Abdomen:  Soft, nontender and nondistended. Neuro/Psych:  Alert and cooperative. Normal mood and affect. A and O x 3  Reviewed labs, radiology imaging, old records and pertinent past GI work up  Patient is appropriate for planned  procedure(s) and anesthesia in an ambulatory setting   K. Veena Calisha Tindel , MD [phone]

## 2024-03-25 ENCOUNTER — Telehealth: Payer: Self-pay | Admitting: *Deleted

## 2024-03-25 NOTE — Telephone Encounter (Signed)
  Follow up Call-     03/24/2024   11:18 AM  Call back number  Post procedure Call Back phone  # [phone]  Permission to leave phone message Yes     Patient questions:  Do you have a fever, pain , or abdominal swelling? No. Pain Score  0 *  Have you tolerated food without any problems? Yes.    Have you been able to return to your normal activities? Yes.    Do you have any questions about your discharge instructions: Diet   No. Medications  No. Follow up visit  No.  Do you have questions or concerns about your Care? No.  Actions: * If pain score is 4 or above: No action needed, pain <4.

## 2024-04-07 NOTE — Addendum Note (Signed)
 Addended by: Ajah Vanhoose on: 04/07/2024 10:25 AM   Modules accepted: Orders

## 2024-04-16 DIAGNOSIS — R519 Headache, unspecified: Secondary | ICD-10-CM | POA: Diagnosis not present

## 2024-04-16 DIAGNOSIS — M9902 Segmental and somatic dysfunction of thoracic region: Secondary | ICD-10-CM | POA: Diagnosis not present

## 2024-04-16 DIAGNOSIS — M9901 Segmental and somatic dysfunction of cervical region: Secondary | ICD-10-CM | POA: Diagnosis not present

## 2024-04-16 DIAGNOSIS — M9908 Segmental and somatic dysfunction of rib cage: Secondary | ICD-10-CM | POA: Diagnosis not present

## 2024-04-23 DIAGNOSIS — F32A Depression, unspecified: Secondary | ICD-10-CM | POA: Diagnosis not present

## 2024-04-30 DIAGNOSIS — F32A Depression, unspecified: Secondary | ICD-10-CM | POA: Diagnosis not present

## 2024-05-05 ENCOUNTER — Encounter: Payer: BC Managed Care – PPO | Admitting: Family Medicine

## 2024-05-13 ENCOUNTER — Encounter: Payer: Self-pay | Admitting: Family Medicine

## 2024-05-13 ENCOUNTER — Ambulatory Visit (INDEPENDENT_AMBULATORY_CARE_PROVIDER_SITE_OTHER): Payer: BC Managed Care – PPO | Admitting: Family Medicine

## 2024-05-13 VITALS — BP 100/70 | HR 78 | Temp 98.3°F | Ht 65.0 in | Wt 174.4 lb

## 2024-05-13 DIAGNOSIS — Z0000 Encounter for general adult medical examination without abnormal findings: Secondary | ICD-10-CM

## 2024-05-13 DIAGNOSIS — E663 Overweight: Secondary | ICD-10-CM | POA: Insufficient documentation

## 2024-05-13 DIAGNOSIS — Z23 Encounter for immunization: Secondary | ICD-10-CM

## 2024-05-13 DIAGNOSIS — Z13220 Encounter for screening for lipoid disorders: Secondary | ICD-10-CM | POA: Diagnosis not present

## 2024-05-13 LAB — CBC WITH DIFFERENTIAL/PLATELET
Basophils Absolute: 0.1 K/uL (ref 0.0–0.1)
Basophils Relative: 0.9 % (ref 0.0–3.0)
Eosinophils Absolute: 0.2 K/uL (ref 0.0–0.7)
Eosinophils Relative: 3.4 % (ref 0.0–5.0)
HCT: 39.6 % (ref 36.0–46.0)
Hemoglobin: 13.2 g/dL (ref 12.0–15.0)
Lymphocytes Relative: 38.7 % (ref 12.0–46.0)
Lymphs Abs: 2.7 K/uL (ref 0.7–4.0)
MCHC: 33.2 g/dL (ref 30.0–36.0)
MCV: 88.4 fl (ref 78.0–100.0)
Monocytes Absolute: 0.6 K/uL (ref 0.1–1.0)
Monocytes Relative: 8 % (ref 3.0–12.0)
Neutro Abs: 3.5 K/uL (ref 1.4–7.7)
Neutrophils Relative %: 49 % (ref 43.0–77.0)
Platelets: 304 K/uL (ref 150.0–400.0)
RBC: 4.48 Mil/uL (ref 3.87–5.11)
RDW: 13.6 % (ref 11.5–15.5)
WBC: 7.1 K/uL (ref 4.0–10.5)

## 2024-05-13 LAB — HEPATIC FUNCTION PANEL
ALT: 17 U/L (ref 0–35)
AST: 16 U/L (ref 0–37)
Albumin: 4.4 g/dL (ref 3.5–5.2)
Alkaline Phosphatase: 66 U/L (ref 39–117)
Bilirubin, Direct: 0 mg/dL (ref 0.0–0.3)
Total Bilirubin: 0.3 mg/dL (ref 0.2–1.2)
Total Protein: 7.6 g/dL (ref 6.0–8.3)

## 2024-05-13 LAB — LIPID PANEL
Cholesterol: 165 mg/dL (ref 0–200)
HDL: 53.8 mg/dL (ref >39.00)
LDL Cholesterol: 79 mg/dL (ref 0–99)
NonHDL: 111.13
Total CHOL/HDL Ratio: 3
Triglycerides: 163 mg/dL — ABNORMAL HIGH (ref 0.0–149.0)
VLDL: 32.6 mg/dL (ref 0.0–40.0)

## 2024-05-13 LAB — BASIC METABOLIC PANEL WITH GFR
BUN: 13 mg/dL (ref 6–23)
CO2: 27 meq/L (ref 19–32)
Calcium: 9.8 mg/dL (ref 8.4–10.5)
Chloride: 102 meq/L (ref 96–112)
Creatinine, Ser: 0.84 mg/dL (ref 0.40–1.20)
GFR: 81.69 mL/min (ref >60.00)
Glucose, Bld: 103 mg/dL — ABNORMAL HIGH (ref 70–99)
Potassium: 4.6 meq/L (ref 3.5–5.1)
Sodium: 136 meq/L (ref 135–145)

## 2024-05-13 LAB — TSH: TSH: 2.28 u[IU]/mL (ref 0.35–5.50)

## 2024-05-13 LAB — VITAMIN D 25 HYDROXY (VIT D DEFICIENCY, FRACTURES): VITD: 60.49 ng/mL (ref 30.00–100.00)

## 2024-05-13 NOTE — Progress Notes (Signed)
   Subjective:    Patient ID: Alexandra [name], female    DOB: 11-Jan-1976, 48 y.o.   MRN: [id number]  HPI CPE- UTD on pap, mammo, colonoscopy.  Tdap due  Patient Care Team    Relationship Specialty Notifications Start End  Mahlon Comer BRAVO, MD PCP - General   09/21/10   Barbette Knock, MD Consulting Physician Obstetrics and Gynecology  04/19/20      Health Maintenance  Topic Date Due   Pneumococcal Vaccine: 19-49 Years (1 of 2 - PCV) Never done   COVID-19 Vaccine (5 - 2024-25 season) 06/10/2023   INFLUENZA VACCINE  05/09/2024   Cervical Cancer Screening (HPV/Pap Cotest)  11/14/2024   MAMMOGRAM  12/04/2024   Colonoscopy  03/24/2029   DTaP/Tdap/Td (3 - Td or Tdap) 05/13/2034   Hepatitis B Vaccines  Completed   Hepatitis C Screening  Completed   HIV Screening  Completed   HPV VACCINES  Aged Out   Meningococcal B Vaccine  Aged Out     Review of Systems Patient reports no vision/ hearing changes, adenopathy,fever, persistant/recurrent hoarseness , swallowing issues, chest pain, palpitations, edema, persistant/recurrent cough, hemoptysis, dyspnea (rest/exertional/paroxysmal nocturnal), gastrointestinal bleeding (melena, rectal bleeding), abdominal pain, significant heartburn, bowel changes, GU symptoms (dysuria, hematuria, incontinence), Gyn symptoms (abnormal  bleeding, pain),  syncope, focal weakness, memory loss, numbness & tingling, skin/hair/nail changes, abnormal bruising or bleeding, anxiety, or depression.   + 10 lb weight gain    Objective:   Physical Exam General Appearance:    Alert, cooperative, no distress, appears stated age  Head:    Normocephalic, without obvious abnormality, atraumatic  Eyes:    PERRL, conjunctiva/corneas clear, EOM's intact both eyes  Ears:    Normal TM's and external ear canals, both ears  Nose:   Nares normal, septum midline, mucosa normal, no drainage    or sinus tenderness  Throat:   Lips, mucosa, and tongue normal; teeth and gums normal   Neck:   Supple, symmetrical, trachea midline, no adenopathy;    Thyroid : no enlargement/tenderness/nodules  Back:     Symmetric, no curvature, ROM normal, no CVA tenderness  Lungs:     Clear to auscultation bilaterally, respirations unlabored  Chest Wall:    No tenderness or deformity   Heart:    Regular rate and rhythm, S1 and S2 normal, no murmur, rub   or gallop  Breast Exam:    Deferred to GYN  Abdomen:     Soft, non-tender, bowel sounds active all four quadrants,    no masses, no organomegaly  Genitalia:    Deferred to GYN  Rectal:    Extremities:   Extremities normal, atraumatic, no cyanosis or edema  Pulses:   2+ and symmetric all extremities  Skin:   Skin color, texture, turgor normal, no rashes or lesions  Lymph nodes:   Cervical, supraclavicular, and axillary nodes normal  Neurologic:   CNII-XII intact, normal strength, sensation and reflexes    throughout          Assessment & Plan:

## 2024-05-13 NOTE — Patient Instructions (Signed)
Follow up in 1 year or as needed We'll notify you of your lab results and make any changes if needed Keep up the good work on healthy diet and regular exercise- you can do it!! Call with any questions or concerns Stay Safe!  Stay Healthy! Enjoy the rest of your summer!!!

## 2024-05-13 NOTE — Assessment & Plan Note (Signed)
 Pt's PE WNL w/ exception of BMI.  UTD on pap, mammo, colonoscopy.  Tdap updated.  Check labs.  Anticipatory guidance provided.

## 2024-05-14 ENCOUNTER — Ambulatory Visit: Payer: Self-pay | Admitting: Family Medicine

## 2024-05-14 DIAGNOSIS — M9902 Segmental and somatic dysfunction of thoracic region: Secondary | ICD-10-CM | POA: Diagnosis not present

## 2024-05-14 DIAGNOSIS — R519 Headache, unspecified: Secondary | ICD-10-CM | POA: Diagnosis not present

## 2024-05-14 DIAGNOSIS — M9908 Segmental and somatic dysfunction of rib cage: Secondary | ICD-10-CM | POA: Diagnosis not present

## 2024-05-14 DIAGNOSIS — M9901 Segmental and somatic dysfunction of cervical region: Secondary | ICD-10-CM | POA: Diagnosis not present

## 2024-06-11 DIAGNOSIS — M9908 Segmental and somatic dysfunction of rib cage: Secondary | ICD-10-CM | POA: Diagnosis not present

## 2024-06-11 DIAGNOSIS — M9901 Segmental and somatic dysfunction of cervical region: Secondary | ICD-10-CM | POA: Diagnosis not present

## 2024-06-11 DIAGNOSIS — M9902 Segmental and somatic dysfunction of thoracic region: Secondary | ICD-10-CM | POA: Diagnosis not present

## 2024-06-11 DIAGNOSIS — R519 Headache, unspecified: Secondary | ICD-10-CM | POA: Diagnosis not present

## 2024-06-18 DIAGNOSIS — F32A Depression, unspecified: Secondary | ICD-10-CM | POA: Diagnosis not present

## 2024-07-09 DIAGNOSIS — R519 Headache, unspecified: Secondary | ICD-10-CM | POA: Diagnosis not present

## 2024-07-09 DIAGNOSIS — M9908 Segmental and somatic dysfunction of rib cage: Secondary | ICD-10-CM | POA: Diagnosis not present

## 2024-07-09 DIAGNOSIS — M9901 Segmental and somatic dysfunction of cervical region: Secondary | ICD-10-CM | POA: Diagnosis not present

## 2024-07-09 DIAGNOSIS — M9902 Segmental and somatic dysfunction of thoracic region: Secondary | ICD-10-CM | POA: Diagnosis not present

## 2024-07-16 DIAGNOSIS — F32A Depression, unspecified: Secondary | ICD-10-CM | POA: Diagnosis not present

## 2024-08-06 DIAGNOSIS — L821 Other seborrheic keratosis: Secondary | ICD-10-CM | POA: Diagnosis not present

## 2024-08-06 DIAGNOSIS — M9901 Segmental and somatic dysfunction of cervical region: Secondary | ICD-10-CM | POA: Diagnosis not present

## 2024-08-06 DIAGNOSIS — D1801 Hemangioma of skin and subcutaneous tissue: Secondary | ICD-10-CM | POA: Diagnosis not present

## 2024-08-06 DIAGNOSIS — M9908 Segmental and somatic dysfunction of rib cage: Secondary | ICD-10-CM | POA: Diagnosis not present

## 2024-08-06 DIAGNOSIS — L700 Acne vulgaris: Secondary | ICD-10-CM | POA: Diagnosis not present

## 2024-08-06 DIAGNOSIS — L814 Other melanin hyperpigmentation: Secondary | ICD-10-CM | POA: Diagnosis not present

## 2024-08-06 DIAGNOSIS — R519 Headache, unspecified: Secondary | ICD-10-CM | POA: Diagnosis not present

## 2024-08-06 DIAGNOSIS — M9902 Segmental and somatic dysfunction of thoracic region: Secondary | ICD-10-CM | POA: Diagnosis not present

## 2024-08-20 DIAGNOSIS — F32A Depression, unspecified: Secondary | ICD-10-CM | POA: Diagnosis not present

## 2024-08-21 ENCOUNTER — Ambulatory Visit: Admitting: Family Medicine

## 2024-08-21 VITALS — BP 118/68 | HR 91 | Temp 99.2°F | Resp 18 | Ht 65.0 in | Wt 173.6 lb

## 2024-08-21 DIAGNOSIS — G44209 Tension-type headache, unspecified, not intractable: Secondary | ICD-10-CM | POA: Diagnosis not present

## 2024-08-21 DIAGNOSIS — F419 Anxiety disorder, unspecified: Secondary | ICD-10-CM | POA: Diagnosis not present

## 2024-08-21 MED ORDER — FLUOXETINE HCL 10 MG PO CAPS: 10.0000 mg | ORAL_CAPSULE | Freq: Every day | ORAL | 3 refills | Status: DC

## 2024-08-21 MED ORDER — METHOCARBAMOL 500 MG PO TABS: 500.0000 mg | ORAL_TABLET | Freq: Three times a day (TID) | ORAL | 0 refills | Status: AC | PRN

## 2024-08-21 NOTE — Patient Instructions (Addendum)
 Follow up in 4 weeks to recheck anxiety START the Fluoxetine  once daily for anxiety Take the Methocarbamol as needed for muscle spasms- particularly before bed Call with any questions or concerns Hang in there!

## 2024-08-21 NOTE — Progress Notes (Signed)
   Subjective:    Patient ID: Alexandra [name], female    DOB: 07/10/1976, 48 y.o.   MRN: [id number]  HPI HA- pt reports getting HA's ~1x/week that will last for a few days.  Admits increased anxiety, which is causing her to clench her teeth, has muscle stiffness in neck.  Sees a chiropractor who feels a lot of her sxs are muscle tightness and due to clenching.  No relief w/ Tylenol  or ibuprofen .  Has tried heat on back of neck.  Does where a bite guard while sleeping.   Review of Systems For ROS see HPI     Objective:   Physical Exam Vitals reviewed.  Constitutional:      General: She is not in acute distress.    Appearance: She is well-developed. She is not ill-appearing.  HENT:     Head: Normocephalic and atraumatic.  Eyes:     Extraocular Movements: Extraocular movements intact.     Right eye: Normal extraocular motion.     Left eye: Normal extraocular motion.     Pupils: Pupils are equal, round, and reactive to light.  Neck:     Comments: Bilateral trap spasm Cardiovascular:     Rate and Rhythm: Normal rate and regular rhythm.  Pulmonary:     Effort: Pulmonary effort is normal. No respiratory distress.  Lymphadenopathy:     Cervical: No cervical adenopathy.  Skin:    General: Skin is warm and dry.  Neurological:     Mental Status: She is alert and oriented to person, place, and time.     Cranial Nerves: No cranial nerve deficit or facial asymmetry.     Gait: Gait normal.  Psychiatric:        Mood and Affect: Mood normal.        Speech: Speech normal.        Behavior: Behavior normal.           Assessment & Plan:  Tension HA- new.  Pt w/ obvious trap spasm bilaterally and admits to clenching teeth regularly.  Will start muscle relaxer to use prn.  Pt expressed understanding and is in agreement w/ plan.   Anxiety- new.  Pt admits that current state of affairs has her very anxious and is contributing to muscle spasm/jaw clenching.  Is open to idea of starting  medication to improve anxiety.  Will start low dose Fluoxetine  and monitor for improvement.  Pt expressed understanding and is in agreement w/ plan.

## 2024-08-27 DIAGNOSIS — R519 Headache, unspecified: Secondary | ICD-10-CM | POA: Diagnosis not present

## 2024-08-27 DIAGNOSIS — M9901 Segmental and somatic dysfunction of cervical region: Secondary | ICD-10-CM | POA: Diagnosis not present

## 2024-08-27 DIAGNOSIS — M9902 Segmental and somatic dysfunction of thoracic region: Secondary | ICD-10-CM | POA: Diagnosis not present

## 2024-08-27 DIAGNOSIS — M9908 Segmental and somatic dysfunction of rib cage: Secondary | ICD-10-CM | POA: Diagnosis not present

## 2024-09-02 ENCOUNTER — Other Ambulatory Visit: Payer: Self-pay | Admitting: Family Medicine

## 2024-09-03 ENCOUNTER — Ambulatory Visit: Admitting: Family Medicine

## 2024-09-10 DIAGNOSIS — F32A Depression, unspecified: Secondary | ICD-10-CM | POA: Diagnosis not present

## 2024-09-12 ENCOUNTER — Other Ambulatory Visit: Payer: Self-pay | Admitting: Family Medicine

## 2024-09-18 ENCOUNTER — Ambulatory Visit (INDEPENDENT_AMBULATORY_CARE_PROVIDER_SITE_OTHER): Admitting: Family Medicine

## 2024-09-18 ENCOUNTER — Encounter: Payer: Self-pay | Admitting: Family Medicine

## 2024-09-18 VITALS — BP 128/64 | HR 94 | Temp 97.9°F | Ht 65.0 in | Wt 175.6 lb

## 2024-09-18 DIAGNOSIS — G44209 Tension-type headache, unspecified, not intractable: Secondary | ICD-10-CM | POA: Diagnosis not present

## 2024-09-18 DIAGNOSIS — F419 Anxiety disorder, unspecified: Secondary | ICD-10-CM | POA: Diagnosis not present

## 2024-09-18 NOTE — Patient Instructions (Signed)
 Schedule your complete physical in August No med changes at this time IF you feel that we need to make adjustments, just message me Call with any questions or concerns Stay Safe!  Stay Healthy! Happy Holidays!!!

## 2024-09-18 NOTE — Assessment & Plan Note (Signed)
 Improved since starting Fluoxetine .  Pt feels that sxs are well controlled at this time and wants to keep meds where they are.  Will continue to follow.

## 2024-09-18 NOTE — Progress Notes (Signed)
° °  Subjective:    Patient ID: Alexandra [name], female    DOB: [birth year], 48 y.o.   MRN: [id number]  HPI Anxiety- started on Fluoxetine  last visit.  Pt reports anxiety is better.  Feels more even keeled.    Tension HA- at last visit we started Fluoxetine  for anxiety and decided to use Robaxin  prn.  Has been using heat and massage w/ relief.   Review of Systems For ROS see HPI     Objective:   Physical Exam Vitals reviewed.  Constitutional:      General: She is not in acute distress.    Appearance: Normal appearance. She is not ill-appearing.  HENT:     Head: Normocephalic and atraumatic.  Eyes:     Extraocular Movements: Extraocular movements intact.     Conjunctiva/sclera: Conjunctivae normal.  Cardiovascular:     Rate and Rhythm: Normal rate and regular rhythm.  Pulmonary:     Effort: Pulmonary effort is normal. No respiratory distress.  Musculoskeletal:     Cervical back: Normal range of motion and neck supple.  Skin:    General: Skin is warm and dry.  Neurological:     General: No focal deficit present.     Mental Status: She is alert and oriented to person, place, and time.  Psychiatric:        Mood and Affect: Mood normal.        Behavior: Behavior normal.        Thought Content: Thought content normal.           Assessment & Plan:  Tension HA- improved w/ tx of anxiety, use of muscle relaxer, and heat/massage.  No changes at this time

## 2024-09-24 DIAGNOSIS — M9908 Segmental and somatic dysfunction of rib cage: Secondary | ICD-10-CM | POA: Diagnosis not present

## 2024-09-24 DIAGNOSIS — M9901 Segmental and somatic dysfunction of cervical region: Secondary | ICD-10-CM | POA: Diagnosis not present

## 2024-09-24 DIAGNOSIS — M9902 Segmental and somatic dysfunction of thoracic region: Secondary | ICD-10-CM | POA: Diagnosis not present

## 2024-09-24 DIAGNOSIS — R519 Headache, unspecified: Secondary | ICD-10-CM | POA: Diagnosis not present

## 2024-10-06 ENCOUNTER — Encounter: Payer: Self-pay | Admitting: Family Medicine

## 2024-10-14 ENCOUNTER — Encounter: Payer: Self-pay | Admitting: Family Medicine

## 2024-10-14 ENCOUNTER — Ambulatory Visit: Admitting: Family Medicine

## 2024-10-14 VITALS — BP 110/70 | HR 91 | Temp 98.6°F | Resp 16 | Ht 65.0 in | Wt 172.8 lb

## 2024-10-14 DIAGNOSIS — F419 Anxiety disorder, unspecified: Secondary | ICD-10-CM

## 2024-10-14 MED ORDER — SERTRALINE HCL 25 MG PO TABS: 25.0000 mg | ORAL_TABLET | Freq: Every day | ORAL | 3 refills | Status: DC

## 2024-10-14 NOTE — Patient Instructions (Signed)
 Follow up via MyChart in 3-4 weeks and let me know how things are going START the Sertraline  (Zoloft ) once daily Monitor for mood improvement and possible side effects in case we need to make changes Call with any questions or concerns Stay Safe!  Stay Healthy! Happy New Year!

## 2024-10-14 NOTE — Progress Notes (Signed)
" ° °  Subjective:    Patient ID: Alexandra [name], female    DOB: 10/10/1975, 49 y.o.   MRN: [id number]  HPI Anxiety- pt reports anxiety was well controlled on Prozac  but developed blurry vision.  Most notable when reading and occurred in central visual field.  Sxs improved when she stopped medication.     Review of Systems For ROS see HPI     Objective:   Physical Exam Vitals reviewed.  Constitutional:      General: She is not in acute distress.    Appearance: Normal appearance. She is not ill-appearing.  HENT:     Head: Normocephalic and atraumatic.  Eyes:     Extraocular Movements: Extraocular movements intact.     Conjunctiva/sclera: Conjunctivae normal.  Skin:    General: Skin is warm and dry.  Neurological:     General: No focal deficit present.     Mental Status: She is alert and oriented to person, place, and time.  Psychiatric:        Mood and Affect: Mood normal.        Behavior: Behavior normal.        Thought Content: Thought content normal.           Assessment & Plan:    "

## 2024-10-14 NOTE — Assessment & Plan Note (Signed)
 Sxs were improved w/ Prozac  but she developed blurry vision when reading- particularly in her central visual field.  Sxs improved when she stopped the Prozac .  Will switch to low dose Sertraline  and monitor for improvement of anxiety and any possible side effects.  Pt expressed understanding and is in agreement w/ plan.

## 2024-10-20 ENCOUNTER — Encounter: Payer: Self-pay | Admitting: Family Medicine

## 2024-10-20 NOTE — Telephone Encounter (Signed)
 Patient notes some irritability and agitation after starting Sertraline , wants to know if she should push through or stop therapy

## 2024-10-23 MED ORDER — VORTIOXETINE HBR 5 MG PO TABS: 5.0000 mg | ORAL_TABLET | Freq: Every day | ORAL | 3 refills | Status: AC

## 2024-10-23 NOTE — Telephone Encounter (Signed)
 Pt feel that that new medication isn't helping , feels that fluoxetine  worked better

## 2024-11-04 ENCOUNTER — Telehealth: Payer: Self-pay

## 2024-11-04 NOTE — Telephone Encounter (Signed)
 ATC to advise patient to bring in sd card/machine to appointment and to see if patient ever brought her sd card to the DME to get a new one ,will also call dme will send mychart message

## 2024-11-05 ENCOUNTER — Ambulatory Visit: Admitting: Primary Care

## 2024-11-05 ENCOUNTER — Encounter: Payer: Self-pay | Admitting: Primary Care

## 2024-11-05 VITALS — BP 126/74 | HR 101 | Temp 97.6°F | Ht 65.0 in | Wt 175.6 lb

## 2024-11-05 DIAGNOSIS — G4733 Obstructive sleep apnea (adult) (pediatric): Secondary | ICD-10-CM | POA: Diagnosis not present

## 2024-11-05 NOTE — Patient Instructions (Signed)
 " VISIT SUMMARY: Alexandra [name] is a 49 year old female who came in for a routine follow-up and compliance check for her CPAP therapy. She was diagnosed with mild sleep apnea in October 2021, and since starting CPAP therapy, her symptoms have significantly improved. She experiences occasional morning bloating, which resolves on its own and is not significantly bothersome.  YOUR PLAN: -OBSTRUCTIVE SLEEP APNEA: Obstructive sleep apnea is a condition where the airway becomes blocked during sleep, causing breathing to stop and start repeatedly. You were diagnosed with mild obstructive sleep apnea in October 2021, and your CPAP therapy has significantly improved your symptoms. Your current CPAP settings are effective, and your apnea score is well-controlled at 0.1. You should continue using your CPAP machine nightly. If the occasional morning bloating becomes bothersome, consider lowering the maximum pressure setting. Plan for a CPAP machine replacement in October 2026. Please send a MyChart message around October 1st for the CPAP machine replacement order.  INSTRUCTIONS: Please send a MyChart message around October 1st for the CPAP machine replacement order.  Sleep Apnea  Sleep apnea is a condition that affects your breathing while you are sleeping. Your tongue or soft tissue in your throat may block the flow of air while you sleep. You may have shallow breathing or stop breathing for short periods of time. People with sleep apnea may snore loudly. There are three kinds of sleep apnea: Obstructive sleep apnea. This kind is caused by a blocked or collapsed airway. This is the most common. Central sleep apnea. This kind happens when the part of the brain that controls breathing does not send the correct signals to the muscles that control breathing. Mixed sleep apnea. This is a combination of obstructive and central sleep apnea. What are the causes? The most common cause of sleep apnea is a collapsed  or blocked airway. What increases the risk? Being very overweight. Having family members with sleep apnea. Having a tongue or tonsils that are larger than normal. Having a small airway or jaw problems. Being older. What are the signs or symptoms? Loud snoring. Restless sleep. Trouble staying asleep. Being sleepy or tired during the day. Waking up gasping or choking. Having a headache in the morning. Mood swings. Having a hard time remembering things and concentrating. How is this diagnosed? A medical history. A physical exam. A sleep study. This is also called a polysomnography test. This test is done at a sleep lab or in your home while you are sleeping. How is this treated? Treatment may include: Sleeping on your side. Losing weight if you're overweight. Wearing an oral appliance. This is a mouthpiece that moves your lower jaw forward. Using a positive airway pressure (PAP) device to keep your airways open while you sleep, such as: A continuous positive airway pressure (CPAP) device. This device gives forced air through a mask when you breathe out. This keeps your airways open. A bilevel positive airway pressure (BIPAP) device. This device gives forced air through a mask when you breathe in and when you breathe out to keep your airways open. Having surgery if other treatments do not work. If your sleep apnea is not treated, you may be at risk for: Heart failure. Heart attack. Stroke. Type 2 diabetes or a problem with your blood sugar called insulin resistance. Follow these instructions at home: Medicines Take your medicines only as told by your health care provider. Avoid alcohol, medicines to help you relax, and certain pain medicines. These may make sleep apnea worse. General instructions  Do not smoke, vape, or use products with nicotine or tobacco in them. If you need help quitting, talk with your provider. If you were given a PAP device to open your airway while you  sleep, use it as told by your provider. If you're having surgery, make sure to tell your provider you have sleep apnea. You may need to bring your PAP device with you. Contact a health care provider if: The PAP device that you were given to use during sleep bothers you or does not seem to be working. You do not feel better or you feel worse. Get help right away if: You have trouble breathing. You have chest pain. You have trouble talking. One side of your body feels weak. A part of your face is hanging down. These symptoms may be an emergency. Call 911 right away. Do not wait to see if the symptoms will go away. Do not drive yourself to the hospital. This information is not intended to replace advice given to you by your health care provider. Make sure you discuss any questions you have with your health care provider. Document Revised: 06/28/2023 Document Reviewed: 11/30/2022 Elsevier Patient Education  2024 [employer].   "

## 2024-11-05 NOTE — Progress Notes (Signed)
 "  @Patient  ID: Alexandra [name] Lung, female    DOB: 05-07-1976, 49 y.o.   MRN: [id number]  Chief Complaint  Patient presents with   Obstructive Sleep Apnea    Cpap f/u     Referring provider: Mahlon Comer BRAVO, MD  HPI: 49 year old female, never smoked. PMH significant for asthma, OSA, GERD, anxiety, overweight.   11/05/2024 Discussed the use of AI scribe software for clinical note transcription with the patient, who gave verbal consent to proceed.  History of Present Illness Alexandra [name] is a 49 year old female with mild sleep apnea who presents for a routine follow-up and compliance check for her CPAP therapy.  She was diagnosed with mild sleep apnea in October 2021, with a sleep study revealing an average of 12.2 apneic events per hour and a lowest oxygen saturation of 89%. Since initiating CPAP therapy, her symptoms have significantly improved.  Initially, she experienced fatigue, required naps during the day, and had issues with snoring. With CPAP therapy, her sleep quality and energy levels have improved. Her current CPAP settings are on auto with a minimum pressure of 4 and a maximum of 20, with a median pressure of 7. Occasionally, the pressure reaches up to 11.8 or 12. Her apnea score has improved to 0.1.  She experiences occasional morning bloating due to air in her stomach, which resolves on its own and is not significantly bothersome. She uses a nasal mask and clenches her teeth at night. She purchases her CPAP supplies independently, finding it more cost-effective than using insurance.  Her husband also uses a CPAP machine, and she has found a reasonably priced supplier in [place]. He has been experiencing issues with his CPAP therapy, including sinus problems and pressure discomfort, but has not yet sought further assistance.    Allergies[1]  Immunization History  Administered Date(s) Administered   Hepatitis B, ADULT 12/25/2017, 01/28/2018   Hepb-cpg  08/11/2020   Influenza-Unspecified 08/26/2021   PFIZER(Purple Top)SARS-COV-2 Vaccination 12/22/2019, 01/13/2020, 09/08/2020   Pfizer Covid-19 Vaccine Bivalent Booster 21yrs & up 08/19/2021   Rho (D) Immune Globulin  07/09/2011   Tdap 12/08/2013, 05/13/2024    Past Medical History:  Diagnosis Date   Abnormal Pap smear    cryo 1999   Allergy    Anemia    in college   Anxiety    Asthma    also has current cough,last attack <yr   Basal cell carcinoma of nose    Family history of malignant neoplasm of gastrointestinal tract    Gallbladder attack    Gallstone    GERD (gastroesophageal reflux disease)    Hemorrhoids    Hx: UTI (urinary tract infection)    Hyperlipidemia    borderline high   Melanoma of lower limb (HCC)    Sleep apnea    uses cpap    Tobacco History: Tobacco Use History[2] Counseling given: Not Answered   Outpatient Medications Prior to Visit  Medication Sig Dispense Refill   albuterol  (VENTOLIN  HFA) 108 (90 Base) MCG/ACT inhaler INHALE 2 PUFFS INTO THE LUNGS EVERY 6 HOURS AS NEEDED FOR WHEEZE 8.5 each 1   Budesonide  90 MCG/ACT inhaler Inhale 2 puffs into the lungs 2 (two) times daily. 1 each 1   CANNABIDIOL PO Take 20 mg by mouth daily in the afternoon.     Cholecalciferol (D3 2000) 50 MCG (2000 UT) CAPS Take 100 Units by mouth daily.     Coenzyme Q10 (COQ10 PO) Take 300 mg by mouth daily.  COMBIPATCH 0.05-0.14 MG/DAY Place 1 patch onto the skin 2 (two) times a week.     estradiol (ESTRACE) 0.1 MG/GM vaginal cream Place 1 Applicatorful vaginally 3 (three) times a week.     loratadine (CLARITIN) 10 MG tablet Take 10 mg by mouth daily.     MAGNESIUM  GLYCINATE PO Take 240 mg by mouth daily at 6 (six) AM.     methocarbamol  (ROBAXIN ) 500 MG tablet Take 1 tablet (500 mg total) by mouth every 8 (eight) hours as needed for muscle spasms. 45 tablet 0   norethindrone-ethinyl estradiol (FYAVOLV) 0.5-2.5 MG-MCG tablet Take 1 tablet by mouth daily.     Red Yeast Rice  Extract (RED YEAST RICE PO) Take 1,200 mg by mouth daily. Red yeast rice with Calcium  (30 mg)     vitamin k 100 MCG tablet Take 100 mcg by mouth daily.     vortioxetine  HBr (TRINTELLIX ) 5 MG TABS tablet Take 1 tablet (5 mg total) by mouth daily. 30 tablet 3   No facility-administered medications prior to visit.   Review of Systems  Review of Systems  Constitutional: Negative.   HENT: Negative.    Respiratory: Negative.    Cardiovascular: Negative.    Physical Exam  BP 126/74   Pulse (!) 101   Temp 97.6 F (36.4 C)   Ht 5' 5 (1.651 m) Comment: pt stated  Wt 175 lb 9.6 oz (79.7 kg)   SpO2 98% Comment: ra  BMI 29.22 kg/m  Physical Exam Constitutional:      Appearance: Normal appearance. She is well-developed.  HENT:     Head: Normocephalic and atraumatic.     Mouth/Throat:     Mouth: Mucous membranes are moist.     Pharynx: Oropharynx is clear.  Eyes:     Pupils: Pupils are equal, round, and reactive to light.  Cardiovascular:     Rate and Rhythm: Normal rate and regular rhythm.     Heart sounds: Normal heart sounds. No murmur heard. Pulmonary:     Effort: Pulmonary effort is normal. No respiratory distress.     Breath sounds: Normal breath sounds. No wheezing or rhonchi.  Musculoskeletal:        General: Normal range of motion.     Cervical back: Normal range of motion and neck supple.  Skin:    General: Skin is warm and dry.     Findings: No erythema or rash.  Neurological:     General: No focal deficit present.     Mental Status: She is alert and oriented to person, place, and time. Mental status is at baseline.  Psychiatric:        Mood and Affect: Mood normal.        Behavior: Behavior normal.        Thought Content: Thought content normal.        Judgment: Judgment normal.    Lab Results:  CBC    Component Value Date/Time   WBC 7.1 05/13/2024 0856   RBC 4.48 05/13/2024 0856   HGB 13.2 05/13/2024 0856   HCT 39.6 05/13/2024 0856   PLT 304.0  05/13/2024 0856   MCV 88.4 05/13/2024 0856   MCV 90.1 05/08/2014 1423   MCH 29.6 04/16/2019 1611   MCHC 33.2 05/13/2024 0856   RDW 13.6 05/13/2024 0856   LYMPHSABS 2.7 05/13/2024 0856   MONOABS 0.6 05/13/2024 0856   EOSABS 0.2 05/13/2024 0856   BASOSABS 0.1 05/13/2024 0856    BMET    Component Value  Date/Time   NA 136 05/13/2024 0856   NA 141 07/11/2016 0000   K 4.6 05/13/2024 0856   CL 102 05/13/2024 0856   CO2 27 05/13/2024 0856   GLUCOSE 103 (H) 05/13/2024 0856   BUN 13 05/13/2024 0856   BUN 12 07/11/2016 0000   CREATININE 0.84 05/13/2024 0856   CREATININE 0.66 04/16/2019 1611   CALCIUM  9.8 05/13/2024 0856   GFRNONAA >90 09/16/2012 0833   GFRAA >90 09/16/2012 0833    BNP No results found for: BNP  ProBNP No results found for: PROBNP  Imaging: No results found.   Assessment & Plan:   1. OSA (obstructive sleep apnea) (Primary)   Assessment and Plan Assessment & Plan Obstructive sleep apnea Mild obstructive sleep apnea diagnosed in October 2021 with an average of 12.2 apnea events per hour. Oxygen saturation dropped to 89% during the sleep study. Symptoms included fatigue, daytime napping, snoring, and waking up tired. CPAP therapy has significantly improved sleep quality and energy levels. Current CPAP settings 4-20cm h20 are effective with an apnea score of 0.1/hour, indicating well-controlled apnea. Occasional bloating, likely related to swallowing air, resolves spontaneously. No air leaks detected. Pressure settings are within the appropriate range. Patient purchases supplies on her own.  - Continue nightly CPAP use  - Consider lowering max pressure setting to alleviate aerophagia if bothersome. - Plan for CPAP machine replacement in October 2026. - Send MyChart message around October 1st for CPAP machine replacement order.   Almarie LELON Ferrari, NP 11/05/2024     [1]  Allergies Allergen Reactions   Prozac  [Fluoxetine  Hcl] Other (See Comments)     Blurry vision   Adhesive [Tape] Rash    Steri strips   Sulfamethoxazole-Trimethoprim Hives  [2]  Social History Tobacco Use  Smoking Status Never  Smokeless Tobacco Never   "

## 2025-05-18 ENCOUNTER — Encounter: Admitting: Family Medicine
